# Patient Record
Sex: Male | Born: 1935 | Race: White | Hispanic: No | Marital: Single | Smoking: Former smoker
Health system: Southern US, Community
[De-identification: ages and names within clinical notes are randomized; demographics above are authoritative.]

## PROBLEM LIST (undated history)

## (undated) DIAGNOSIS — Z923 Personal history of irradiation: Secondary | ICD-10-CM

## (undated) DIAGNOSIS — IMO0001 Reserved for inherently not codable concepts without codable children: Secondary | ICD-10-CM

## (undated) DIAGNOSIS — Z87442 Personal history of urinary calculi: Secondary | ICD-10-CM

## (undated) DIAGNOSIS — I10 Essential (primary) hypertension: Secondary | ICD-10-CM

## (undated) DIAGNOSIS — Z9861 Coronary angioplasty status: Secondary | ICD-10-CM

## (undated) DIAGNOSIS — I2699 Other pulmonary embolism without acute cor pulmonale: Secondary | ICD-10-CM

## (undated) DIAGNOSIS — R198 Other specified symptoms and signs involving the digestive system and abdomen: Secondary | ICD-10-CM

## (undated) DIAGNOSIS — E785 Hyperlipidemia, unspecified: Secondary | ICD-10-CM

## (undated) DIAGNOSIS — K219 Gastro-esophageal reflux disease without esophagitis: Secondary | ICD-10-CM

## (undated) DIAGNOSIS — C61 Malignant neoplasm of prostate: Secondary | ICD-10-CM

## (undated) DIAGNOSIS — M1990 Unspecified osteoarthritis, unspecified site: Secondary | ICD-10-CM

## (undated) DIAGNOSIS — I2510 Atherosclerotic heart disease of native coronary artery without angina pectoris: Secondary | ICD-10-CM

## (undated) DIAGNOSIS — Z951 Presence of aortocoronary bypass graft: Secondary | ICD-10-CM

## (undated) DIAGNOSIS — M109 Gout, unspecified: Secondary | ICD-10-CM

## (undated) HISTORY — DX: Presence of aortocoronary bypass graft: Z95.1

## (undated) HISTORY — DX: Other pulmonary embolism without acute cor pulmonale: I26.99

## (undated) HISTORY — DX: Hyperlipidemia, unspecified: E78.5

## (undated) HISTORY — DX: Essential (primary) hypertension: I10

## (undated) HISTORY — DX: Coronary angioplasty status: Z98.61

## (undated) HISTORY — DX: Personal history of irradiation: Z92.3

## (undated) HISTORY — DX: Atherosclerotic heart disease of native coronary artery without angina pectoris: I25.10

## (undated) HISTORY — DX: Other specified symptoms and signs involving the digestive system and abdomen: R19.8

## (undated) HISTORY — DX: Personal history of urinary calculi: Z87.442

## (undated) HISTORY — DX: Gout, unspecified: M10.9

---

## 1993-04-20 HISTORY — PX: BACK SURGERY: SHX140

## 1998-09-18 ENCOUNTER — Ambulatory Visit (HOSPITAL_COMMUNITY): Admission: RE | Admit: 1998-09-18 | Discharge: 1998-09-18 | Payer: Self-pay | Admitting: Endocrinology

## 1998-10-03 ENCOUNTER — Ambulatory Visit (HOSPITAL_COMMUNITY): Admission: RE | Admit: 1998-10-03 | Discharge: 1998-10-03 | Payer: Self-pay | Admitting: Cardiology

## 1998-10-03 ENCOUNTER — Encounter: Payer: Self-pay | Admitting: Cardiology

## 1998-10-17 ENCOUNTER — Observation Stay (HOSPITAL_COMMUNITY): Admission: AD | Admit: 1998-10-17 | Discharge: 1998-10-18 | Payer: Self-pay | Admitting: Cardiology

## 1998-10-17 DIAGNOSIS — E785 Hyperlipidemia, unspecified: Secondary | ICD-10-CM | POA: Insufficient documentation

## 1998-10-17 HISTORY — PX: CORONARY ANGIOPLASTY WITH STENT PLACEMENT: SHX49

## 1998-11-21 ENCOUNTER — Encounter (HOSPITAL_COMMUNITY): Admission: RE | Admit: 1998-11-21 | Discharge: 1999-02-19 | Payer: Self-pay | Admitting: Cardiology

## 1998-11-25 ENCOUNTER — Ambulatory Visit (HOSPITAL_COMMUNITY): Admission: RE | Admit: 1998-11-25 | Discharge: 1998-11-25 | Payer: Self-pay | Admitting: Cardiology

## 1998-11-25 ENCOUNTER — Encounter: Payer: Self-pay | Admitting: Cardiology

## 1999-07-17 ENCOUNTER — Encounter: Payer: Self-pay | Admitting: Cardiology

## 1999-07-17 ENCOUNTER — Encounter: Admission: RE | Admit: 1999-07-17 | Discharge: 1999-07-17 | Payer: Self-pay | Admitting: Cardiology

## 1999-07-21 ENCOUNTER — Ambulatory Visit (HOSPITAL_COMMUNITY): Admission: RE | Admit: 1999-07-21 | Discharge: 1999-07-22 | Payer: Self-pay | Admitting: Cardiology

## 1999-07-21 HISTORY — PX: CORONARY ANGIOPLASTY: SHX604

## 2004-07-12 ENCOUNTER — Emergency Department (HOSPITAL_COMMUNITY): Admission: EM | Admit: 2004-07-12 | Discharge: 2004-07-12 | Payer: Self-pay | Admitting: Emergency Medicine

## 2004-11-03 ENCOUNTER — Emergency Department (HOSPITAL_COMMUNITY): Admission: EM | Admit: 2004-11-03 | Discharge: 2004-11-03 | Payer: Self-pay | Admitting: Emergency Medicine

## 2004-11-17 ENCOUNTER — Encounter: Admission: RE | Admit: 2004-11-17 | Discharge: 2004-11-17 | Payer: Self-pay | Admitting: Endocrinology

## 2010-01-22 ENCOUNTER — Encounter (INDEPENDENT_AMBULATORY_CARE_PROVIDER_SITE_OTHER): Payer: Self-pay | Admitting: General Surgery

## 2010-01-22 ENCOUNTER — Observation Stay (HOSPITAL_COMMUNITY): Admission: RE | Admit: 2010-01-22 | Discharge: 2010-01-23 | Payer: Self-pay | Admitting: General Surgery

## 2010-01-22 HISTORY — PX: CHOLECYSTECTOMY: SHX55

## 2010-02-17 ENCOUNTER — Encounter: Admission: RE | Admit: 2010-02-17 | Discharge: 2010-02-17 | Payer: Self-pay | Admitting: Orthopedic Surgery

## 2010-07-02 LAB — COMPREHENSIVE METABOLIC PANEL
AST: 36 U/L (ref 0–37)
CO2: 29 mEq/L (ref 19–32)
Chloride: 100 mEq/L (ref 96–112)
Creatinine, Ser: 1.19 mg/dL (ref 0.4–1.5)
GFR calc Af Amer: >60 mL/min (ref >60)
GFR calc non Af Amer: >60 mL/min (ref >60)
Glucose, Bld: 94 mg/dL (ref 70–99)
Total Bilirubin: 1.1 mg/dL (ref 0.3–1.2)

## 2010-07-02 LAB — CBC
MCH: 31.4 pg (ref 26.0–34.0)
WBC: 6.6 10*3/uL (ref 4.0–10.5)

## 2010-07-02 LAB — DIFFERENTIAL
Basophils Absolute: 0 10*3/uL (ref 0.0–0.1)
Eosinophils Absolute: 0.2 10*3/uL (ref 0.0–0.7)
Eosinophils Relative: 3 % (ref 0–5)
Lymphocytes Relative: 19 % (ref 12–46)
Neutrophils Relative %: 67 % (ref 43–77)

## 2010-07-02 LAB — PROTIME-INR: Prothrombin Time: 12.5 seconds (ref 11.6–15.2)

## 2010-07-02 LAB — SURGICAL PCR SCREEN: Staphylococcus aureus: NEGATIVE

## 2010-09-05 NOTE — Cardiovascular Report (Signed)
[place]  Patient:    Robert Boyle, Robert Boyle                      MRN: [id number] Proc. Date: 07/21/99 Adm. Date:  [id number] Attending:  Loreli Dollar CC:         Alfonse Alpers. Dagoberto Ligas, M.D.             Cath. Lab.             Thereasa Solo. Little, M.D.                        Cardiac Catheterization  INDICATION FOR TESTS:  Mr. Schmutz is a 75 year old male who had a stent placed to his diagonal October 17, 1998.  He has developed some atypical chest pain and fatigue similar to what he had before his June intervention.  Because of this he was brought to the cath lab for reevaluation.  His cath is done as an outpatient.  PROCEDURE: 1. Left heart catheterization. 2. Selective right and left coronary arteriography. 3. Ventriculography in the RAO projection. 4. Angioplasty within the stent to the first diagonal.  COMPLICATIONS:  None.  PROCEDURE:  The patient was prepped and draped in the usual sterile fashion exposing the right groin.  Following a local anesthetic of 1% Xylocaine, the Seldinger technique was employed and a #6 [place] introducer sheath was placed into the right femoral artery.  Selective right and left coronary arteriography and ventriculography in the RAO projection was performed.  RESULTS: 1. Hemodynamic monitoring:  Central aortic pressure 157/84.  Left ventricular    pressure 155/18, with no significant valve gradient noted at the time of    pullback. 2. Ventriculography:  Ventriculography in the RAO projection revealed normal left    ventricular systolic function.  Ejection fraction greater than 60%.    End-diastolic pressure was 20.  Mitral valve prolapse without mitral    regurgitation was noted.  CORONARY ANGIOGRAPHY:  The stent was noted on fluoroscopy but no significant calcification was seen.  1. Left main normal. 2. Left anterior descending:  The LAD extended down and crossed the apex of the  heart and the sternal  portion of the LAD was relatively small in diameter.  In  the midportion of the LAD, well past the takeoff of the first diagonal was an    area of 40-50% narrowing.  The first diagonal which had a stent in its    midportion had an area of 70-75% restenosis with the midportion of the stent.    There was a side branch that came off within the stent and it did not appear    to be affected.  There was brisk distal flow - TIMI-3 despite the above    mentioned stenosis. 3. Circumflex:  The circumflex was a small vessel and gave rise to one terminal    OM branch.  This system was free of disease. 4. Optional diagonal:  Small free of disease. 5. Right coronary artery:  The right coronary artery was the largest of all three    vessels.  It was about 3.5-4 mm in diameter.  There was multiple sequential    30 and 40% areas of narrowing in the midportion.  The PDA was normal and the    posterolateral branches were normal.  CONCLUSION: 1. Normal left ventricular systolic function. 2. Mitral valve prolapse without mitral regurgitation. 3. Within the stent restenosis in the  diagonal with mild disease involving the    the left anterior descending and right coronary artery.  Because of the stenosis within the stent, arrangements were made for repeat intervention.  A #6 [place] guide catheter JL-3.5, a short luge wire and a 2.5 x 15 CrossSail balloon was made ready.  The wire was placed down the diagonal without difficulty.  The balloon was positioned within the body of the stent and the first inflation 6 for 40 seconds was performed.  The next two inflations were associated with the balloon wanting to migrate either proximally or distally. he last two inflations 5 x 54, and 7 x 62, were associated with excellent positioning and excellent result.  After the above mentioned inflations, the vessel appeared to be normal with no evidence of residual narrowing and no effect at the side branch. There  was brisk distal flow.  During the procedure, the patient was given 12,500 units of IV heparin.  He will be discharged in the morning. DD:  07/21/99 TD:  07/21/99 Job: [id number]

## 2010-10-07 ENCOUNTER — Other Ambulatory Visit: Payer: Self-pay | Admitting: Emergency Medicine

## 2010-10-07 ENCOUNTER — Ambulatory Visit
Admission: RE | Admit: 2010-10-07 | Discharge: 2010-10-07 | Disposition: A | Discharge status: Home / Self Care | Payer: Medicare Other | Source: Ambulatory Visit | Attending: Emergency Medicine | Admitting: Emergency Medicine

## 2010-10-07 DIAGNOSIS — R110 Nausea: Secondary | ICD-10-CM

## 2010-10-07 MED ORDER — IOHEXOL 300 MG/ML  SOLN
100.0000 mL | Freq: Once | INTRAMUSCULAR | Status: AC | PRN
  Administered 2010-10-07: 100 mL via INTRAVENOUS

## 2011-11-06 HISTORY — PX: NM MYOCAR PERF WALL MOTION: HXRAD629

## 2012-08-30 ENCOUNTER — Other Ambulatory Visit: Payer: Self-pay | Admitting: Dermatology

## 2012-12-08 ENCOUNTER — Encounter: Payer: Self-pay | Admitting: *Deleted

## 2012-12-12 ENCOUNTER — Encounter: Payer: Self-pay | Admitting: Cardiovascular Disease

## 2012-12-13 ENCOUNTER — Encounter: Payer: Self-pay | Admitting: Cardiovascular Disease

## 2012-12-13 ENCOUNTER — Ambulatory Visit: Payer: Medicare Other | Admitting: Cardiology

## 2012-12-13 ENCOUNTER — Ambulatory Visit (INDEPENDENT_AMBULATORY_CARE_PROVIDER_SITE_OTHER): Payer: Medicare Other | Admitting: Cardiovascular Disease

## 2012-12-13 VITALS — BP 142/74 | HR 82 | Resp 16 | Ht 73.0 in | Wt 199.1 lb

## 2012-12-13 DIAGNOSIS — I2510 Atherosclerotic heart disease of native coronary artery without angina pectoris: Secondary | ICD-10-CM

## 2012-12-13 DIAGNOSIS — E785 Hyperlipidemia, unspecified: Secondary | ICD-10-CM

## 2012-12-13 DIAGNOSIS — M109 Gout, unspecified: Secondary | ICD-10-CM

## 2012-12-13 DIAGNOSIS — I10 Essential (primary) hypertension: Secondary | ICD-10-CM

## 2012-12-13 NOTE — Patient Instructions (Addendum)
Your physician recommends that you schedule a follow-up appointment in: One year.  

## 2012-12-19 ENCOUNTER — Encounter: Payer: Self-pay | Admitting: Cardiovascular Disease

## 2012-12-19 DIAGNOSIS — M109 Gout, unspecified: Secondary | ICD-10-CM | POA: Insufficient documentation

## 2012-12-19 NOTE — Assessment & Plan Note (Signed)
Borderline elevated systolic blood pressure, but he says that home blood pressures lower. No changes made

## 2012-12-19 NOTE — Assessment & Plan Note (Signed)
Asymptomatic. Recent normal nuclear perfusion study. No changes in medications

## 2012-12-19 NOTE — Assessment & Plan Note (Signed)
We'll try to retrieve the more recent results.

## 2012-12-19 NOTE — Progress Notes (Signed)
Patient ID: Robert Boyle, male   DOB: [birth year], 77 y.o.   MRN: [id number]     Reason for office visit CAD followup  This is my first encounter with Robert Boyle who was previously cared for by Dr. Lamar Blinks until his retirement last year. Robert Boyle presented with coronary disease in 2000 when he received a bare-metal stent to the diagonal artery, patent we peak angioplasty for in-stent restenosis one year later but has not had any further cardiac event since that time. In the year that has passed since his last appointment he has not had any complaints of chest pain, shortness of breath, syncope, palpitations, lower extremity edema or other cardiovascular problems    No Known Allergies  Current Outpatient Prescriptions  Medication Sig Dispense Refill  . allopurinol (ZYLOPRIM) 300 MG tablet Take 300 mg by mouth daily.      Marland Kitchen amLODipine (NORVASC) 5 MG tablet Take 5 mg by mouth daily.      Marland Kitchen aspirin 325 MG EC tablet Take 325 mg by mouth daily.      Marland Kitchen atorvastatin (LIPITOR) 80 MG tablet Take 80 mg by mouth daily.      . celecoxib (CELEBREX) 200 MG capsule Take 200 mg by mouth daily.      Marland Kitchen esomeprazole (NEXIUM) 40 MG capsule Take 40 mg by mouth daily before breakfast.      . furosemide (LASIX) 20 MG tablet Take 20 mg by mouth daily.       No current facility-administered medications for this visit.    Past Medical History  Diagnosis Date  . Hypertension   . Hyperlipidemia   . Gout   . CAD (coronary artery disease)   . Gastrointestinal complaints     Past Surgical History  Procedure Laterality Date  . Coronary angioplasty with stent placement  10/17/1998    PCI & STENT to LAD  . Coronary angioplasty  07/21/1999    PCI to instent stenosis to LAD  . Back surgery  1995  . Nm myocar perf wall motion  11/06/2011    low risk 63% EF    Family History  Problem Relation Age of Onset  . Heart attack Father   . Stroke Mother   . Heart attack Brother     History   Social  History  . Marital Status: Single    Spouse Name: N/A    Number of Children: N/A  . Years of Education: N/A   Occupational History  . Not on file.   Social History Main Topics  . Smoking status: Former Smoker    Quit date: 04/20/1983  . Smokeless tobacco: Not on file  . Alcohol Use: Yes  . Drug Use: No  . Sexual Activity: Not on file   Other Topics Concern  . Not on file   Social History Narrative  . No narrative on file    Review of systems: The patient specifically denies any chest pain at rest or with exertion, dyspnea at rest or with exertion, orthopnea, paroxysmal nocturnal dyspnea, syncope, palpitations, focal neurological deficits, intermittent claudication, lower extremity edema, unexplained weight gain, cough, hemoptysis or wheezing.  The patient also denies abdominal pain, nausea, vomiting, dysphagia, diarrhea, constipation, polyuria, polydipsia, dysuria, hematuria, frequency, urgency, abnormal bleeding or bruising, fever, chills, unexpected weight changes, mood swings, change in skin or hair texture, change in voice quality, auditory or visual problems, allergic reactions or rashes, new musculoskeletal complaints other than usual "aches and pains".   PHYSICAL EXAM BP  142/74  Pulse 82  Resp 16  Ht 6\' 1"  (1.854 m)  Wt 199 lb 1.6 oz (90.311 kg)  BMI 26.27 kg/m2  General: Alert, oriented x3, no distress Head: no evidence of trauma, PERRL, EOMI, no exophtalmos or lid lag, no myxedema, no xanthelasma; normal ears, nose and oropharynx Neck: normal jugular venous pulsations and no hepatojugular reflux; brisk carotid pulses without delay and no carotid bruits Chest: clear to auscultation, no signs of consolidation by percussion or palpation, normal fremitus, symmetrical and full respiratory excursions Cardiovascular: normal position and quality of the apical impulse, regular rhythm, normal first and second heart sounds, no murmurs, rubs or gallops Abdomen: no tenderness  or distention, no masses by palpation, no abnormal pulsatility or arterial bruits, normal bowel sounds, no hepatosplenomegaly Extremities: no clubbing, cyanosis or edema; 2+ radial, ulnar and brachial pulses bilaterally; 2+ right femoral, posterior tibial and dorsalis pedis pulses; 2+ left femoral, posterior tibial and dorsalis pedis pulses; no subclavian or femoral bruits Neurological: grossly nonfocal   EKG: Sinus rhythm with a couple of premature atrial complexes, otherwise normal tracing  Lipid Panel  Was recent lipid profile that I have available for review is a couple of years old and shows a total cholesterol of 183, triglycerides 116, HDL 78, LDL 82. He reportedly had more recent laboratory tests with his primary care physician but these are not currently available  BMET    Component Value Date/Time   NA 136 01/22/2010 1351   K 3.8 01/22/2010 1351   CL 100 01/22/2010 1351   CO2 29 01/22/2010 1351   GLUCOSE 94 01/22/2010 1351   BUN 15 01/22/2010 1351   CREATININE 1.19 01/22/2010 1351   CALCIUM 9.2 01/22/2010 1351   GFRNONAA >60 01/22/2010 1351   GFRAA  Value: >60        The eGFR has been calculated using the MDRD equation. This calculation has not been validated in all clinical situations. eGFR's persistently <60 mL/min signify possible Chronic Kidney Disease. 01/22/2010 1351     ASSESSMENT AND PLAN CAD STATUS post bare-metal stent to diagonal 2000 (AVE660 2.5x18), treated for in stent restenosis 2001 Asymptomatic. Recent normal nuclear perfusion study. No changes in medications  Hyperlipidemia We'll try to retrieve the more recent results.  HTN (hypertension) Borderline elevated systolic blood pressure, but he says that home blood pressures lower. No changes made  Gout No recent arthritis attacks   Orders Placed This Encounter  Procedures  . EKG 12-Lead   Followup in one year Grete Bosko  Thurmon Fair, MD, [facility] [phone]  office [phone] pager

## 2012-12-19 NOTE — Assessment & Plan Note (Signed)
No recent arthritis attacks

## 2013-05-23 DIAGNOSIS — C61 Malignant neoplasm of prostate: Secondary | ICD-10-CM

## 2013-05-23 HISTORY — DX: Malignant neoplasm of prostate: C61

## 2013-05-23 HISTORY — PX: PROSTATE BIOPSY: SHX241

## 2013-06-13 ENCOUNTER — Encounter: Payer: Self-pay | Admitting: Radiation Oncology

## 2013-06-13 NOTE — Progress Notes (Signed)
GU Location of Tumor / Histology: prostate  If Prostate Cancer, Gleason Score is (4 + 3) and PSA is (5.60 on 04/25/13)  Patient presented 1 months ago with signs/symptoms of: elevated PSA, nocturia 02/15/13 PSA 4.6 08/15/12  PSA 4.3  Biopsies of prostate (if applicable) revealed: adenocarcinoma, 7/12 cores, Gleason 4+3=7, volume 34.32 cc  Past/Anticipated interventions by urology, if any: biopsy  Past/Anticipated interventions by medical oncology, if any: none  Weight changes, if any: no  Bowel/Bladder complaints, if any:  Urinary frequency, nocturia x 1, IPSS 3  Nausea/Vomiting, if any: no  Pain issues, if any:  no  SAFETY ISSUES:  Prior radiation? no  Pacemaker/ICD? no  Possible current pregnancy? na  Is the patient on methotrexate? no  Current Complaints / other details:  Single, retired from [employer], 2 sons, 1 daughter Discuss seed therapy w/prostate "sandwich" hormone therapy, IMRT.

## 2013-06-14 ENCOUNTER — Ambulatory Visit
Admission: RE | Admit: 2013-06-14 | Discharge: 2013-06-14 | Disposition: A | Discharge status: Home / Self Care | Payer: Medicare Other | Source: Ambulatory Visit | Attending: Radiation Oncology | Admitting: Radiation Oncology

## 2013-06-14 ENCOUNTER — Encounter: Payer: Self-pay | Admitting: Radiation Oncology

## 2013-06-14 VITALS — BP 140/89 | HR 88 | Temp 98.1°F | Resp 20 | Ht 73.0 in | Wt 199.0 lb

## 2013-06-14 DIAGNOSIS — E785 Hyperlipidemia, unspecified: Secondary | ICD-10-CM | POA: Insufficient documentation

## 2013-06-14 DIAGNOSIS — C61 Malignant neoplasm of prostate: Secondary | ICD-10-CM

## 2013-06-14 DIAGNOSIS — Z9861 Coronary angioplasty status: Secondary | ICD-10-CM | POA: Insufficient documentation

## 2013-06-14 DIAGNOSIS — Z87442 Personal history of urinary calculi: Secondary | ICD-10-CM | POA: Insufficient documentation

## 2013-06-14 DIAGNOSIS — I10 Essential (primary) hypertension: Secondary | ICD-10-CM | POA: Insufficient documentation

## 2013-06-14 DIAGNOSIS — I2510 Atherosclerotic heart disease of native coronary artery without angina pectoris: Secondary | ICD-10-CM | POA: Insufficient documentation

## 2013-06-14 DIAGNOSIS — E7800 Pure hypercholesterolemia, unspecified: Secondary | ICD-10-CM | POA: Insufficient documentation

## 2013-06-14 DIAGNOSIS — Z87891 Personal history of nicotine dependence: Secondary | ICD-10-CM | POA: Insufficient documentation

## 2013-06-14 DIAGNOSIS — Z823 Family history of stroke: Secondary | ICD-10-CM | POA: Insufficient documentation

## 2013-06-14 DIAGNOSIS — M109 Gout, unspecified: Secondary | ICD-10-CM | POA: Insufficient documentation

## 2013-06-14 DIAGNOSIS — Z79899 Other long term (current) drug therapy: Secondary | ICD-10-CM | POA: Insufficient documentation

## 2013-06-14 DIAGNOSIS — K219 Gastro-esophageal reflux disease without esophagitis: Secondary | ICD-10-CM | POA: Insufficient documentation

## 2013-06-14 DIAGNOSIS — Z8249 Family history of ischemic heart disease and other diseases of the circulatory system: Secondary | ICD-10-CM | POA: Insufficient documentation

## 2013-06-14 HISTORY — DX: Unspecified osteoarthritis, unspecified site: M19.90

## 2013-06-14 HISTORY — DX: Malignant neoplasm of prostate: C61

## 2013-06-14 HISTORY — DX: Gastro-esophageal reflux disease without esophagitis: K21.9

## 2013-06-14 NOTE — Progress Notes (Signed)
Please see the Nurse Progress Note in the MD Initial Consult Encounter for this patient. 

## 2013-06-14 NOTE — Progress Notes (Signed)
[facility] NEW PATIENT EVALUATION  Name: Robert Boyle MRN: [id number]  Date:   06/14/2013           DOB: [birth year]  Status: outpatient   CC: Chesley Noon, MD  Ailene Rud, *    REFERRING PHYSICIAN: Carolan Clines I, *   DIAGNOSIS:  Stage TI C. intermediate risk adenocarcinoma prostate.  HISTORY OF PRESENT ILLNESS:  Robert Boyle is a 78 y.o. male who is seen today through the courtesy of Dr. Gaynelle Arabian for discussion of possible radiation therapy in the management of his stage TI C. intermediate risk adenocarcinoma prostate. He was noted to have a rise in his PSA from 4.6 in October 2014 to 5.6 on 04/25/2013. He underwent ultrasound-guided biopsies on 05/23/2013 revealing Gleason 7 (4+3) involving 80% of one core from the left lateral apex and Gleason 7 (3+4) involving 50% of one core from right lateral base, 60% of one core from the right base, 30% of one core from left lateral base, and 20% of one core from the left base. He also had Gleason 6 (3+3) involving 30% of one core from the left lateral mid gland, and 10% of one core from the left mid gland. His gland volume was 34.3 cc. He is doing well from a GU and GI standpoint. His I PSS score is 3. He does have some degree of erectile dysfunction which improves with Viagra.  PREVIOUS RADIATION THERAPY: No   PAST MEDICAL HISTORY:  has a past medical history of Hypertension; Hyperlipidemia; Gout; CAD (coronary artery disease); Gastrointestinal complaints; Prostate cancer (05/23/13); Arthritis; GERD (gastroesophageal reflux disease); Gout; Hypercholesterolemia; and Renal calculi.     PAST SURGICAL HISTORY:  Past Surgical History  Procedure Laterality Date  . Coronary angioplasty with stent placement  10/17/1998    PCI & STENT to LAD  . Coronary angioplasty  07/21/1999    PCI to instent stenosis to LAD  . Nm myocar perf wall motion  11/06/2011    low risk 63% EF  . Prostate biopsy   05/23/13    gleason 4+3=7, vol 34.32 cc  . Cholecystectomy  01/22/10  . Back surgery  1995    lumbar     FAMILY HISTORY: family history includes Cancer in his brother; Heart attack in his brother and father; Stroke in his mother. his father died of cardiac disease and 49. His mother died following a stroke at 66. A younger brother was diagnosed with prostate cancer and had seed implantation at age 81.   SOCIAL HISTORY:  reports that he quit smoking about 30 years ago. He does not have any smokeless tobacco history on file. He reports that he drinks alcohol. He reports that he does not use illicit drugs. Married, 3 children. 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   ALLERGIES: Review of patient's allergies indicates no known allergies.   MEDICATIONS:  Current Outpatient Prescriptions  Medication Sig Dispense Refill  . allopurinol (ZYLOPRIM) 300 MG tablet Take 300 mg by mouth daily.      Marland Kitchen amLODipine (NORVASC) 5 MG tablet Take 5 mg by mouth daily.      Marland Kitchen aspirin 325 MG EC tablet Take 325 mg by mouth daily.      Marland Kitchen atorvastatin (LIPITOR) 80 MG tablet Take 80 mg by mouth daily.      . celecoxib (CELEBREX) 200 MG capsule Take 200 mg by mouth daily.      Marland Kitchen esomeprazole (NEXIUM) 40 MG capsule  Take 40 mg by mouth daily before breakfast.      . furosemide (LASIX) 20 MG tablet Take 20 mg by mouth daily.       No current facility-administered medications for this encounter.     REVIEW OF SYSTEMS:  Pertinent items are noted in HPI.    PHYSICAL EXAM:  height is 6\' 1"  (1.854 m) and weight is 199 lb (90.266 kg). His oral temperature is 98.1 F (36.7 C). His blood pressure is 140/89 and his pulse is 88. His respiration is 20.   Alert and oriented 78 year old white male appearing his stated age. Head and neck examination: Grossly unremarkable. Nodes: Without palpable cervical or supraclavicular lymphadenopathy. Chest: Lungs clear. Back: Without spinal or CVA tenderness. Heart: Regular in  rhythm. Abdomen: Without masses organomegaly. Genitalia: Unremarkable to inspection. Rectal: The prostate gland is normal in size and is without focal induration or nodularity. Extremities: Without edema.   LABORATORY DATA:  Lab Results  Component Value Date   WBC 6.6 01/22/2010   HGB 14.6 01/22/2010   HCT 42.5 01/22/2010   MCV 91.4 01/22/2010   PLT 139* 01/22/2010   Lab Results  Component Value Date   NA 136 01/22/2010   K 3.8 01/22/2010   CL 100 01/22/2010   CO2 29 01/22/2010   Lab Results  Component Value Date   ALT 36 01/22/2010   AST 36 01/22/2010   ALKPHOS 95 01/22/2010   BILITOT 1.1 01/22/2010   PSA 5.6 from 04/25/2013   IMPRESSION: Stage TI C. intermediate risk adenocarcinoma prostate. I explained to the patient and his wife that his prognosis is related to his stage, PSA level, and Gleason score. His stage and PSA are favorable while his Gleason score of 7 is of intermediate favorability. He does have moderate volume disease. We discussed radiation therapy options including 5 weeks of external beam followed by seed implantation or 8 weeks of external beam/IMRT. We also discussed short-term androgen deprivation therapy which is currently being looked at in a national trial. This would be a consideration considering the number of positive Gleason 7 biopsies. We discussed the potential acute and late toxicities of radiation therapy. He is not interested in seed implantation. He'll think things over and decide on whether not he would like to consider short-term androgen deprivation therapy. He knows that we need to have Dr. Gaynelle Arabian placed 3 gold seed markers within the prostate for image guidance. We also talked about treating him with a comfortably full bladder to minimize urinary related toxicity.   PLAN: As discussed above. The patient will contact me if and when he would like to proceed with radiation therapy.  I spent 60 minutes minutes face to face with the patient and more than 50%  of that time was spent in counseling and/or coordination of care.

## 2013-06-15 ENCOUNTER — Encounter: Payer: Self-pay | Admitting: Radiation Oncology

## 2013-06-15 NOTE — Progress Notes (Signed)
CC: Dr. Katrine Coho   Chart note: The patient called me today to some and that he wants to proceed with external beam radiation therapy in addition to short-term androgen deprivation therapy for 6-7 months beginning 2-3 months before initiation of radiation therapy. I will contact Dr. Arlyn Leak nurse and get his androgen deprivation therapy started. We will kindly request Dr. Gaynelle Arabian to place 3 gold seed markers for image guidance sometime within the next 6-8 weeks. I will see him for a followup visit in 2 months and they get him scheduled for simulation/treatment planning. Consent was previously signed.

## 2013-06-16 ENCOUNTER — Telehealth: Payer: Self-pay | Admitting: *Deleted

## 2013-06-16 NOTE — Telephone Encounter (Signed)
XXXX 

## 2013-06-16 NOTE — Telephone Encounter (Signed)
CALLED PATIENT TO INFORM OF GOLD SEED PLACEMENT ON 07-12-13 @ 1:45 PM @ UROLOGIST'S OFFICE AND HIS [place] VISIT ON 08-15-13, LVM FOR A RETURN CALL

## 2013-08-14 NOTE — Progress Notes (Addendum)
GU Location of Tumor / Histology: prostate   If Prostate Cancer, Gleason Score is (4 + 3) and PSA is (5.60 on 04/25/13)   Patient presented 1 months ago with signs/symptoms of: elevated PSA, nocturia  02/15/13 PSA 4.6  08/15/12 PSA 4.3   Biopsies of prostate (if applicable) revealed: adenocarcinoma, 7/12 cores, Gleason 4+3=7, volume 34.32 cc   Past/Anticipated interventions by urology, if any: biopsy ,  07/12/13  3 gold seed markers placed by Dr Gaynelle Arabian Past/Anticipated interventions by medical oncology, if any: none  Weight changes, if any: no  Bowel/Bladder complaints, if any: Urinary frequency, nocturia x 2-3 which is slight increase in past 2 months. Nausea/Vomiting, if any: no  Pain issues, if any: no   SAFETY ISSUES:  Prior radiation? no  Pacemaker/ICD? no  Possible current pregnancy? na  Is the patient on methotrexate? No  Current Complaints / other details: Single, retired from [employer], 2 sons, 1 daughter  Pt placed on Paxil per Dr Gaynelle Arabian for hot flashes, no improvement at this point.  Received hormone injection approximately 2 weeks ago per pt.

## 2013-08-15 ENCOUNTER — Ambulatory Visit
Admission: RE | Admit: 2013-08-15 | Discharge: 2013-08-15 | Disposition: A | Discharge status: Home / Self Care | Payer: Medicare HMO | Source: Ambulatory Visit | Attending: Radiation Oncology | Admitting: Radiation Oncology

## 2013-08-15 ENCOUNTER — Encounter: Payer: Self-pay | Admitting: Radiation Oncology

## 2013-08-15 VITALS — BP 173/88 | HR 83 | Temp 97.9°F | Resp 20 | Wt 201.1 lb

## 2013-08-15 DIAGNOSIS — R5381 Other malaise: Secondary | ICD-10-CM | POA: Insufficient documentation

## 2013-08-15 DIAGNOSIS — R5383 Other fatigue: Secondary | ICD-10-CM

## 2013-08-15 DIAGNOSIS — C61 Malignant neoplasm of prostate: Secondary | ICD-10-CM | POA: Insufficient documentation

## 2013-08-15 NOTE — Addendum Note (Signed)
Encounter addended by: Andria Rhein, RN on: 08/15/2013  9:09 AM<BR>     Documentation filed: Charges VN

## 2013-08-15 NOTE — Progress Notes (Signed)
CC: Dr. Katrine Coho, Dr. Anastasia Pall  Diagnosis: Stage TI C. intermediate risk adenocarcinoma prostate.  History: Robert Boyle is a pleasant 78 year old male who is seen today for review and scheduling of his radiation therapy in the management of his stage TI C. intermediate risk adenocarcinoma prostate.He was noted to have a rise in his PSA from 4.6 in October 2014 to 5.6 on 04/25/2013. He underwent ultrasound-guided biopsies on 05/23/2013 revealing Gleason 7 (4+3) involving 80% of one core from the left lateral apex and Gleason 7 (3+4) involving 50% of one core from right lateral base, 60% of one core from the right base, 30% of one core from left lateral base, and 20% of one core from the left base. He also had Gleason 6 (3+3) involving 30% of one core from the left lateral mid gland, and 10% of one core from the left mid gland. His gland volume was 34.3 cc.  His I PSS score was 3. He does have some degree of erectile dysfunction which improves with Viagra. He elected for short-term androgen deprivation therapy along with external beam/IMRT. He received a Firmagon injection on March 2 and then a six-month Depo-Lupron injection on 07/20/2013. Gold markers were placed on March 25. He states that he is doing well from a GU and GI standpoint although he does have nocturia x2-3 which began with initiation of androgen deprivation therapy. He does report occasional hot flashes. He was started on Paxil for hot flashes. He also admits to mild fatigue.  Physical examination: Alert and oriented. Filed Vitals:   08/15/13 0815  BP: 173/88  Pulse: 83  Temp: 97.9 F (36.6 C)  Resp: 20   Rectal examination not performed.   Impression:  Stage TI C. intermediate risk adenocarcinoma prostate. Again, we discussed the potential acute and late toxicities of radiation therapy. We discussed having a comfortably full bladder for treatment planning and also each daily treatment to minimize treatment-related  urinary toxicity. I'll have him return in mid  May for simulation/treatment planning and then begin his treatment by early June. Consent is signed today.  25 minutes was spent face-to-face with the patient, primarily counseling the patient and coordinating his care.

## 2013-08-15 NOTE — Progress Notes (Signed)
Please see the Nurse Progress Note in the MD Initial Consult Encounter for this patient. 

## 2013-08-16 NOTE — Addendum Note (Signed)
Encounter addended by: Deirdre Evener, RN on: 08/16/2013  7:29 PM<BR>     Documentation filed: Charges VN

## 2013-08-28 ENCOUNTER — Ambulatory Visit
Admission: RE | Admit: 2013-08-28 | Discharge: 2013-08-28 | Disposition: A | Discharge status: Home / Self Care | Payer: Medicare HMO | Source: Ambulatory Visit | Attending: Radiation Oncology | Admitting: Radiation Oncology

## 2013-08-28 DIAGNOSIS — C61 Malignant neoplasm of prostate: Secondary | ICD-10-CM | POA: Diagnosis not present

## 2013-08-28 DIAGNOSIS — Z510 Encounter for antineoplastic radiation therapy: Secondary | ICD-10-CM | POA: Diagnosis not present

## 2013-08-28 NOTE — Progress Notes (Signed)
Complex simulation/treatment planning note: The patient was taken to the CT simulator. He was placed supine. A VAC LOC immobilization device was constructed. He had placement of a red rubber catheter within the rectal vault. He was then catheterized for a urethrogram. He was then scanned. I chose and isocenter in the center of the prostate. The CT data set was sent to the MIM tanning system I contoured his prostate, seminal vesicles, bladder, rectum, and lower rectosigmoid colon. Prescribing 7800 cGy in 40 sessions to his prostate PTV which represents the prostate plus 0.8 cm except for 0.5 cm along the rectum. I am prescribing 5600 cGy in 40 sessions to his seminal vesicles PTV which represents the seminal vesicles plus 0.5 cm. He will be treated with a comfortably full bladder. I requesting daily cone beam CT, setting up to his 3 gold seeds.

## 2013-08-29 ENCOUNTER — Encounter: Payer: Self-pay | Admitting: Radiation Oncology

## 2013-08-29 DIAGNOSIS — Z510 Encounter for antineoplastic radiation therapy: Secondary | ICD-10-CM | POA: Diagnosis not present

## 2013-08-29 NOTE — Progress Notes (Signed)
IMRT simulation/treatment planning note: The patient completed his IMRT simulation/treatment planning in the management of his carcinoma the prostate. IMRT was chosen to decrease the risk for both acute and late bladder and rectal toxicity compared to conventional or 3-D conformal radiation therapy. Dose volume histograms were obtained for the target structures including the seminal vesicle and prostate PTV's in addition to avoidance structures including the rectum, bladder, and femoral heads. We met our departmental guidelines. I'm prescribing 7800 cGy in 40 sessions to his prostate PTV and 5600 cGy to his seminal vesicle PTV. He is to be treated with a comfortably full bladder. I am requesting daily cone beam CT, setting up to his 3 gold seeds.

## 2013-08-30 DIAGNOSIS — Z510 Encounter for antineoplastic radiation therapy: Secondary | ICD-10-CM | POA: Diagnosis not present

## 2013-09-06 ENCOUNTER — Ambulatory Visit
Admission: RE | Admit: 2013-09-06 | Discharge: 2013-09-06 | Disposition: A | Discharge status: Home / Self Care | Payer: Medicare HMO | Source: Ambulatory Visit | Attending: Radiation Oncology | Admitting: Radiation Oncology

## 2013-09-06 DIAGNOSIS — Z510 Encounter for antineoplastic radiation therapy: Secondary | ICD-10-CM | POA: Diagnosis not present

## 2013-09-06 DIAGNOSIS — C61 Malignant neoplasm of prostate: Secondary | ICD-10-CM

## 2013-09-06 NOTE — Progress Notes (Signed)
Chart note:   The patient began his radiation therapy today. He is being treated with 2 volume modulated arcs with 2 sets of dynamic MLCs corresponding to one set of IMRT treatment devices ([id number]).

## 2013-09-07 ENCOUNTER — Ambulatory Visit: Payer: Medicare HMO

## 2013-09-07 ENCOUNTER — Ambulatory Visit
Admission: RE | Admit: 2013-09-07 | Discharge: 2013-09-07 | Disposition: A | Discharge status: Home / Self Care | Payer: Medicare HMO | Source: Ambulatory Visit | Attending: Radiation Oncology | Admitting: Radiation Oncology

## 2013-09-07 DIAGNOSIS — Z510 Encounter for antineoplastic radiation therapy: Secondary | ICD-10-CM | POA: Diagnosis not present

## 2013-09-08 ENCOUNTER — Ambulatory Visit
Admission: RE | Admit: 2013-09-08 | Discharge: 2013-09-08 | Disposition: A | Discharge status: Home / Self Care | Payer: Medicare HMO | Source: Ambulatory Visit | Attending: Radiation Oncology | Admitting: Radiation Oncology

## 2013-09-08 DIAGNOSIS — Z510 Encounter for antineoplastic radiation therapy: Secondary | ICD-10-CM | POA: Diagnosis not present

## 2013-09-08 DIAGNOSIS — C61 Malignant neoplasm of prostate: Secondary | ICD-10-CM

## 2013-09-08 NOTE — Progress Notes (Addendum)
Patient education done radiation therapy and you book given, discussed fatigue, skin irritation, pain, bladder changes, loose stools, increase protein in diet, stay hydrated ,drink plenty water, come with full bladder before rad txs, sees MD Mondays, but will see this Tuesday instead,Holiday this Monday, , all questions answered, noticed right pink eye, patient stated it has been bothering him a few days and will see his Eye Dr., teach back given 9:15 AM

## 2013-09-12 ENCOUNTER — Ambulatory Visit
Admission: RE | Admit: 2013-09-12 | Discharge: 2013-09-12 | Disposition: A | Discharge status: Home / Self Care | Payer: Medicare HMO | Source: Ambulatory Visit | Attending: Radiation Oncology | Admitting: Radiation Oncology

## 2013-09-12 VITALS — BP 165/86 | HR 80 | Resp 20 | Wt 201.6 lb

## 2013-09-12 DIAGNOSIS — C61 Malignant neoplasm of prostate: Secondary | ICD-10-CM

## 2013-09-12 DIAGNOSIS — Z510 Encounter for antineoplastic radiation therapy: Secondary | ICD-10-CM | POA: Diagnosis not present

## 2013-09-12 NOTE — Progress Notes (Signed)
Pt denies pain, urinary/bowel issues, fatigue, loss of appetite. 

## 2013-09-12 NOTE — Progress Notes (Signed)
Weekly Management Note:  Site: Prostate Current Dose:  780  cGy Projected Dose: 7800  cGy  Narrative: The patient is seen today for routine under treatment assessment. CBCT/MVCT images/port films were reviewed. The chart was reviewed.   Bladder filling is excellent. He does report fatigue which is unchanged. No GU or GI difficulties.  Physical Examination:  Filed Vitals:   09/12/13 0857  BP: 165/86  Pulse: 80  Resp: 20  .  Weight: 201 lb 9.6 oz (91.445 kg). No change.  Impression: Tolerating radiation therapy well.  Plan: Continue radiation therapy as planned.

## 2013-09-13 ENCOUNTER — Ambulatory Visit
Admission: RE | Admit: 2013-09-13 | Discharge: 2013-09-13 | Disposition: A | Discharge status: Home / Self Care | Payer: Medicare HMO | Source: Ambulatory Visit | Attending: Radiation Oncology | Admitting: Radiation Oncology

## 2013-09-13 DIAGNOSIS — Z510 Encounter for antineoplastic radiation therapy: Secondary | ICD-10-CM | POA: Diagnosis not present

## 2013-09-14 ENCOUNTER — Ambulatory Visit
Admission: RE | Admit: 2013-09-14 | Discharge: 2013-09-14 | Disposition: A | Discharge status: Home / Self Care | Payer: Medicare HMO | Source: Ambulatory Visit | Attending: Radiation Oncology | Admitting: Radiation Oncology

## 2013-09-14 DIAGNOSIS — Z510 Encounter for antineoplastic radiation therapy: Secondary | ICD-10-CM | POA: Diagnosis not present

## 2013-09-15 ENCOUNTER — Ambulatory Visit
Admission: RE | Admit: 2013-09-15 | Discharge: 2013-09-15 | Disposition: A | Discharge status: Home / Self Care | Payer: Medicare HMO | Source: Ambulatory Visit | Attending: Radiation Oncology | Admitting: Radiation Oncology

## 2013-09-15 DIAGNOSIS — Z510 Encounter for antineoplastic radiation therapy: Secondary | ICD-10-CM | POA: Diagnosis not present

## 2013-09-18 ENCOUNTER — Ambulatory Visit
Admission: RE | Admit: 2013-09-18 | Discharge: 2013-09-18 | Disposition: A | Discharge status: Home / Self Care | Payer: Medicare HMO | Source: Ambulatory Visit | Attending: Radiation Oncology | Admitting: Radiation Oncology

## 2013-09-18 ENCOUNTER — Encounter: Payer: Self-pay | Admitting: Radiation Oncology

## 2013-09-18 VITALS — BP 178/95 | HR 88 | Temp 97.6°F | Resp 20 | Wt 202.7 lb

## 2013-09-18 DIAGNOSIS — Z510 Encounter for antineoplastic radiation therapy: Secondary | ICD-10-CM | POA: Diagnosis not present

## 2013-09-18 DIAGNOSIS — C61 Malignant neoplasm of prostate: Secondary | ICD-10-CM

## 2013-09-18 NOTE — Progress Notes (Signed)
Weekly Management Note:  Site: Prostate Current Dose:  1560  cGy Projected Dose: 7800  cGy  Narrative: The patient is seen today for routine under treatment assessment. CBCT/MVCT images/port films were reviewed. The chart was reviewed.   Bladder filling is satisfactory. No GU or GI difficulties. We discussed his hypertension which may be related to excess salt intake this past weekend.  Physical Examination:  Filed Vitals:   09/18/13 0859  BP: 178/95  Pulse: 88  Temp: 97.6 F (36.4 C)  Resp: 20  .  Weight: 202 lb 11.2 oz (91.944 kg). No change.  Impression: Tolerating radiation therapy well.  Plan: Continue radiation therapy as planned.

## 2013-09-18 NOTE — Progress Notes (Addendum)
Pt denies pain, fatigue, loss of appetite, urinary/bowel issues. He continues to have hot flashes. Pt's BP high; he states he has not taken his BP meds this morning. Pt will check his BP at home today after he takes his medications.

## 2013-09-19 ENCOUNTER — Ambulatory Visit
Admission: RE | Admit: 2013-09-19 | Discharge: 2013-09-19 | Disposition: A | Discharge status: Home / Self Care | Payer: Medicare HMO | Source: Ambulatory Visit | Attending: Radiation Oncology | Admitting: Radiation Oncology

## 2013-09-19 DIAGNOSIS — Z510 Encounter for antineoplastic radiation therapy: Secondary | ICD-10-CM | POA: Diagnosis not present

## 2013-09-20 ENCOUNTER — Ambulatory Visit
Admission: RE | Admit: 2013-09-20 | Discharge: 2013-09-20 | Disposition: A | Discharge status: Home / Self Care | Payer: Medicare HMO | Source: Ambulatory Visit | Attending: Radiation Oncology | Admitting: Radiation Oncology

## 2013-09-20 DIAGNOSIS — Z510 Encounter for antineoplastic radiation therapy: Secondary | ICD-10-CM | POA: Diagnosis not present

## 2013-09-21 ENCOUNTER — Ambulatory Visit
Admission: RE | Admit: 2013-09-21 | Discharge: 2013-09-21 | Disposition: A | Discharge status: Home / Self Care | Payer: Medicare HMO | Source: Ambulatory Visit | Attending: Radiation Oncology | Admitting: Radiation Oncology

## 2013-09-21 DIAGNOSIS — Z510 Encounter for antineoplastic radiation therapy: Secondary | ICD-10-CM | POA: Diagnosis not present

## 2013-09-22 ENCOUNTER — Ambulatory Visit
Admission: RE | Admit: 2013-09-22 | Discharge: 2013-09-22 | Disposition: A | Discharge status: Home / Self Care | Payer: Medicare HMO | Source: Ambulatory Visit | Attending: Radiation Oncology | Admitting: Radiation Oncology

## 2013-09-22 DIAGNOSIS — Z510 Encounter for antineoplastic radiation therapy: Secondary | ICD-10-CM | POA: Diagnosis not present

## 2013-09-25 ENCOUNTER — Encounter: Payer: Self-pay | Admitting: Radiation Oncology

## 2013-09-25 ENCOUNTER — Ambulatory Visit
Admission: RE | Admit: 2013-09-25 | Discharge: 2013-09-25 | Disposition: A | Discharge status: Home / Self Care | Payer: Medicare HMO | Source: Ambulatory Visit | Attending: Radiation Oncology | Admitting: Radiation Oncology

## 2013-09-25 VITALS — BP 160/95 | HR 81 | Temp 97.8°F | Resp 20 | Wt 205.9 lb

## 2013-09-25 DIAGNOSIS — C61 Malignant neoplasm of prostate: Secondary | ICD-10-CM

## 2013-09-25 DIAGNOSIS — Z510 Encounter for antineoplastic radiation therapy: Secondary | ICD-10-CM | POA: Diagnosis not present

## 2013-09-25 NOTE — Progress Notes (Signed)
Pt denies pain, fatigue, loss of appetite, urinary issues. Her states he has occasional diarrhea which resolves on it's own.

## 2013-09-25 NOTE — Progress Notes (Signed)
   Weekly Management Note:  outpatient Current Dose:  25.35 Gy  Projected Dose: 78 Gy   Narrative:  The patient presents for routine under treatment assessment.  CBCT/MVCT images/Port film x-rays were reviewed.  The chart was checked. Occasional diarrhea, otherrwise no complaints  Physical Findings:  weight is 205 lb 14.4 oz (93.396 kg). His oral temperature is 97.8 F (36.6 C). His blood pressure is 160/95 and his pulse is 81. His respiration is 20.  NAD  Impression:  The patient is tolerating radiotherapy.  Plan:  Continue radiotherapy as planned. Imodium AD PRN diarrhea if it worsens.  ________________________________   Eppie Gibson, M.D.

## 2013-09-26 ENCOUNTER — Ambulatory Visit
Admission: RE | Admit: 2013-09-26 | Discharge: 2013-09-26 | Disposition: A | Discharge status: Home / Self Care | Payer: Medicare HMO | Source: Ambulatory Visit | Attending: Radiation Oncology | Admitting: Radiation Oncology

## 2013-09-26 DIAGNOSIS — Z510 Encounter for antineoplastic radiation therapy: Secondary | ICD-10-CM | POA: Diagnosis not present

## 2013-09-27 ENCOUNTER — Ambulatory Visit
Admission: RE | Admit: 2013-09-27 | Discharge: 2013-09-27 | Disposition: A | Discharge status: Home / Self Care | Payer: Medicare HMO | Source: Ambulatory Visit | Attending: Radiation Oncology | Admitting: Radiation Oncology

## 2013-09-27 DIAGNOSIS — Z510 Encounter for antineoplastic radiation therapy: Secondary | ICD-10-CM | POA: Diagnosis not present

## 2013-09-28 ENCOUNTER — Ambulatory Visit
Admission: RE | Admit: 2013-09-28 | Discharge: 2013-09-28 | Disposition: A | Discharge status: Home / Self Care | Payer: Medicare HMO | Source: Ambulatory Visit | Attending: Radiation Oncology | Admitting: Radiation Oncology

## 2013-09-28 DIAGNOSIS — Z510 Encounter for antineoplastic radiation therapy: Secondary | ICD-10-CM | POA: Diagnosis not present

## 2013-09-29 ENCOUNTER — Ambulatory Visit
Admission: RE | Admit: 2013-09-29 | Discharge: 2013-09-29 | Disposition: A | Discharge status: Home / Self Care | Payer: Medicare HMO | Source: Ambulatory Visit | Attending: Radiation Oncology | Admitting: Radiation Oncology

## 2013-09-29 DIAGNOSIS — Z510 Encounter for antineoplastic radiation therapy: Secondary | ICD-10-CM | POA: Diagnosis not present

## 2013-10-02 ENCOUNTER — Encounter: Payer: Self-pay | Admitting: Radiation Oncology

## 2013-10-02 ENCOUNTER — Ambulatory Visit
Admission: RE | Admit: 2013-10-02 | Discharge: 2013-10-02 | Disposition: A | Discharge status: Home / Self Care | Payer: Medicare HMO | Source: Ambulatory Visit | Attending: Radiation Oncology | Admitting: Radiation Oncology

## 2013-10-02 VITALS — BP 153/92 | HR 101 | Temp 97.8°F | Resp 20 | Wt 203.3 lb

## 2013-10-02 DIAGNOSIS — Z510 Encounter for antineoplastic radiation therapy: Secondary | ICD-10-CM | POA: Diagnosis not present

## 2013-10-02 DIAGNOSIS — C61 Malignant neoplasm of prostate: Secondary | ICD-10-CM

## 2013-10-02 NOTE — Progress Notes (Signed)
Weekly Management Note:  Site: Prostate Current Dose:  3510  cGy Projected Dose: 7800  cGy  Narrative: The patient is seen today for routine under treatment assessment. CBCT/MVCT images/port films were reviewed. The chart was reviewed.   Bladder filling is excellent. No significant GU or GI difficulty although he does have moderate  fatigue. He is bothered by hot flashes at night. He was given a trial of Paxil Dr. Gaynelle Arabian which was not helpful. She'll get back in touch with Dr. Gaynelle Arabian regarding another remedy.  Physical Examination:  Filed Vitals:   10/02/13 0904  BP: 153/92  Pulse: 101  Temp: 97.8 F (36.6 C)  Resp: 20  .  Weight: 203 lb 4.8 oz (92.216 kg). No change.  Impression: Tolerating radiation therapy well.  Plan: Continue radiation therapy as planned.

## 2013-10-02 NOTE — Progress Notes (Signed)
Pt denies pain, loss of appetite, urinary/bowel issues. He is fatigued, c/o hot flashes that wake him 4-5 x nightly. He stopped taking Paxil, states he could not tell it was helpful.

## 2013-10-03 ENCOUNTER — Ambulatory Visit
Admission: RE | Admit: 2013-10-03 | Discharge: 2013-10-03 | Disposition: A | Discharge status: Home / Self Care | Payer: Medicare HMO | Source: Ambulatory Visit | Attending: Radiation Oncology | Admitting: Radiation Oncology

## 2013-10-03 DIAGNOSIS — Z510 Encounter for antineoplastic radiation therapy: Secondary | ICD-10-CM | POA: Diagnosis not present

## 2013-10-04 ENCOUNTER — Ambulatory Visit
Admission: RE | Admit: 2013-10-04 | Discharge: 2013-10-04 | Disposition: A | Discharge status: Home / Self Care | Payer: Medicare HMO | Source: Ambulatory Visit | Attending: Radiation Oncology | Admitting: Radiation Oncology

## 2013-10-04 DIAGNOSIS — Z510 Encounter for antineoplastic radiation therapy: Secondary | ICD-10-CM | POA: Diagnosis not present

## 2013-10-05 ENCOUNTER — Ambulatory Visit
Admission: RE | Admit: 2013-10-05 | Discharge: 2013-10-05 | Disposition: A | Discharge status: Home / Self Care | Payer: Medicare HMO | Source: Ambulatory Visit | Attending: Radiation Oncology | Admitting: Radiation Oncology

## 2013-10-05 DIAGNOSIS — Z510 Encounter for antineoplastic radiation therapy: Secondary | ICD-10-CM | POA: Diagnosis not present

## 2013-10-06 ENCOUNTER — Ambulatory Visit
Admission: RE | Admit: 2013-10-06 | Discharge: 2013-10-06 | Disposition: A | Discharge status: Home / Self Care | Payer: Medicare HMO | Source: Ambulatory Visit | Attending: Radiation Oncology | Admitting: Radiation Oncology

## 2013-10-06 DIAGNOSIS — Z510 Encounter for antineoplastic radiation therapy: Secondary | ICD-10-CM | POA: Diagnosis not present

## 2013-10-09 ENCOUNTER — Ambulatory Visit
Admission: RE | Admit: 2013-10-09 | Discharge: 2013-10-09 | Disposition: A | Discharge status: Home / Self Care | Payer: Medicare HMO | Source: Ambulatory Visit | Attending: Radiation Oncology | Admitting: Radiation Oncology

## 2013-10-09 VITALS — BP 153/91 | HR 92 | Temp 97.4°F | Resp 20 | Wt 205.8 lb

## 2013-10-09 DIAGNOSIS — C61 Malignant neoplasm of prostate: Secondary | ICD-10-CM

## 2013-10-09 DIAGNOSIS — Z510 Encounter for antineoplastic radiation therapy: Secondary | ICD-10-CM | POA: Diagnosis not present

## 2013-10-09 NOTE — Progress Notes (Signed)
Patient denies pain, urinary/bowel issues except occasionally burning w/urinaiton. He is fatigued, continues to have hot flashes.

## 2013-10-09 NOTE — Progress Notes (Signed)
Weekly Management Note:  Site: Prostate Current Dose:  4485  cGy Projected Dose: 7800  cGy  Narrative: The patient is seen today for routine under treatment assessment. CBCT/MVCT images/port films were reviewed. The chart was reviewed.   Bladder filling is excellent. No new GU or GI difficulty. He continues to have moderate fatigue with hot flashes.  Physical Examination:  Filed Vitals:   10/09/13 0906  BP: 153/91  Pulse: 92  Temp: 97.4 F (36.3 C)  Resp: 20  .  Weight: 205 lb 12.8 oz (93.35 kg). No change.  Impression: Tolerating radiation therapy well.  Plan: Continue radiation therapy as planned.

## 2013-10-10 ENCOUNTER — Ambulatory Visit
Admission: RE | Admit: 2013-10-10 | Discharge: 2013-10-10 | Disposition: A | Discharge status: Home / Self Care | Payer: Medicare HMO | Source: Ambulatory Visit | Attending: Radiation Oncology | Admitting: Radiation Oncology

## 2013-10-10 DIAGNOSIS — Z510 Encounter for antineoplastic radiation therapy: Secondary | ICD-10-CM | POA: Diagnosis not present

## 2013-10-11 ENCOUNTER — Ambulatory Visit
Admission: RE | Admit: 2013-10-11 | Discharge: 2013-10-11 | Disposition: A | Discharge status: Home / Self Care | Payer: Medicare HMO | Source: Ambulatory Visit | Attending: Radiation Oncology | Admitting: Radiation Oncology

## 2013-10-11 DIAGNOSIS — Z510 Encounter for antineoplastic radiation therapy: Secondary | ICD-10-CM | POA: Diagnosis not present

## 2013-10-12 ENCOUNTER — Ambulatory Visit
Admission: RE | Admit: 2013-10-12 | Discharge: 2013-10-12 | Disposition: A | Discharge status: Home / Self Care | Payer: Medicare HMO | Source: Ambulatory Visit | Attending: Radiation Oncology | Admitting: Radiation Oncology

## 2013-10-12 DIAGNOSIS — Z510 Encounter for antineoplastic radiation therapy: Secondary | ICD-10-CM | POA: Diagnosis not present

## 2013-10-13 ENCOUNTER — Ambulatory Visit
Admission: RE | Admit: 2013-10-13 | Discharge: 2013-10-13 | Disposition: A | Discharge status: Home / Self Care | Payer: Medicare HMO | Source: Ambulatory Visit | Attending: Radiation Oncology | Admitting: Radiation Oncology

## 2013-10-13 DIAGNOSIS — Z510 Encounter for antineoplastic radiation therapy: Secondary | ICD-10-CM | POA: Diagnosis not present

## 2013-10-16 ENCOUNTER — Encounter: Payer: Self-pay | Admitting: Radiation Oncology

## 2013-10-16 ENCOUNTER — Ambulatory Visit
Admission: RE | Admit: 2013-10-16 | Discharge: 2013-10-16 | Disposition: A | Discharge status: Home / Self Care | Payer: Medicare HMO | Source: Ambulatory Visit | Attending: Radiation Oncology | Admitting: Radiation Oncology

## 2013-10-16 VITALS — BP 153/88 | HR 97 | Temp 97.6°F | Resp 20 | Wt 204.5 lb

## 2013-10-16 DIAGNOSIS — C61 Malignant neoplasm of prostate: Secondary | ICD-10-CM

## 2013-10-16 DIAGNOSIS — Z510 Encounter for antineoplastic radiation therapy: Secondary | ICD-10-CM | POA: Diagnosis not present

## 2013-10-16 NOTE — Progress Notes (Signed)
Weekly rad txs prostate 28/40 completd, nocturia x4, slight dysuria at times, no pain, energy level good,, does get tired,  hot flashes most c/o of, appetite good, drinks 8 glasses water daily 9:08 AM

## 2013-10-16 NOTE — Progress Notes (Signed)
   Weekly Management Note:  outpatient Current Dose:  54.6 Gy  Projected Dose: 78 Gy  prostate  Narrative:  The patient presents for routine under treatment assessment.  CBCT/MVCT images/Port film x-rays were reviewed.  The chart was checked. NAD, no new issues. Urinary function stable. + hot flashes  Physical Findings:  weight is 204 lb 8 oz (92.761 kg). His oral temperature is 97.6 F (36.4 C). His blood pressure is 153/88 and his pulse is 97. His respiration is 20.  NAD  Impression:  The patient is tolerating radiotherapy.  Plan:  Continue radiotherapy as planned.   ________________________________   Eppie Gibson, M.D.

## 2013-10-17 ENCOUNTER — Ambulatory Visit
Admission: RE | Admit: 2013-10-17 | Discharge: 2013-10-17 | Disposition: A | Discharge status: Home / Self Care | Payer: Medicare HMO | Source: Ambulatory Visit | Attending: Radiation Oncology | Admitting: Radiation Oncology

## 2013-10-17 DIAGNOSIS — Z510 Encounter for antineoplastic radiation therapy: Secondary | ICD-10-CM | POA: Diagnosis not present

## 2013-10-18 ENCOUNTER — Ambulatory Visit
Admission: RE | Admit: 2013-10-18 | Discharge: 2013-10-18 | Disposition: A | Discharge status: Home / Self Care | Payer: Medicare HMO | Source: Ambulatory Visit | Attending: Radiation Oncology | Admitting: Radiation Oncology

## 2013-10-18 DIAGNOSIS — Z510 Encounter for antineoplastic radiation therapy: Secondary | ICD-10-CM | POA: Diagnosis not present

## 2013-10-19 ENCOUNTER — Ambulatory Visit
Admission: RE | Admit: 2013-10-19 | Discharge: 2013-10-19 | Disposition: A | Discharge status: Home / Self Care | Payer: Medicare HMO | Source: Ambulatory Visit | Attending: Radiation Oncology | Admitting: Radiation Oncology

## 2013-10-19 DIAGNOSIS — Z510 Encounter for antineoplastic radiation therapy: Secondary | ICD-10-CM | POA: Diagnosis not present

## 2013-10-23 ENCOUNTER — Ambulatory Visit
Admission: RE | Admit: 2013-10-23 | Discharge: 2013-10-23 | Disposition: A | Discharge status: Home / Self Care | Payer: Medicare HMO | Source: Ambulatory Visit | Attending: Radiation Oncology | Admitting: Radiation Oncology

## 2013-10-23 ENCOUNTER — Encounter: Payer: Self-pay | Admitting: Radiation Oncology

## 2013-10-23 VITALS — BP 152/85 | HR 97 | Temp 98.2°F | Resp 20 | Wt 204.6 lb

## 2013-10-23 DIAGNOSIS — C61 Malignant neoplasm of prostate: Secondary | ICD-10-CM

## 2013-10-23 DIAGNOSIS — Z510 Encounter for antineoplastic radiation therapy: Secondary | ICD-10-CM | POA: Diagnosis not present

## 2013-10-23 NOTE — Progress Notes (Signed)
Weekly Management Note:  Site: Prostate Current Dose:  6240  cGy Projected Dose: 7800  cGy  Narrative: The patient is seen today for routine under treatment assessment. CBCT/MVCT images/port films were reviewed. The chart was reviewed.   Bladder filling is satisfactory. He is having more dysuria. Otherwise, no new GU or GI difficulties.  Physical Examination:  Filed Vitals:   10/23/13 0859  BP: 152/85  Pulse: 97  Temp: 98.2 F (36.8 C)  Resp: 20  .  Weight: 204 lb 9.6 oz (92.806 kg). No change.  Impression: Tolerating radiation therapy well, although he is having worsening dysuria/radiation urethritis/cystitis. I told he may take ibuprofen or Aleve when necessary for dysuria.  Plan: Continue radiation therapy as planned.

## 2013-10-23 NOTE — Progress Notes (Signed)
Weekly rad txs prostate, 32/40 completed, has nocturia x4 now, also hot flashes, and dysuria more frequently stated, bowels  Normal, drinks 6-8 glasses water  daily  9:01 AM

## 2013-10-24 ENCOUNTER — Ambulatory Visit
Admission: RE | Admit: 2013-10-24 | Discharge: 2013-10-24 | Disposition: A | Discharge status: Home / Self Care | Payer: Medicare HMO | Source: Ambulatory Visit | Attending: Radiation Oncology | Admitting: Radiation Oncology

## 2013-10-24 DIAGNOSIS — Z510 Encounter for antineoplastic radiation therapy: Secondary | ICD-10-CM | POA: Diagnosis not present

## 2013-10-25 ENCOUNTER — Ambulatory Visit
Admission: RE | Admit: 2013-10-25 | Discharge: 2013-10-25 | Disposition: A | Discharge status: Home / Self Care | Payer: Medicare HMO | Source: Ambulatory Visit | Attending: Radiation Oncology | Admitting: Radiation Oncology

## 2013-10-25 DIAGNOSIS — Z510 Encounter for antineoplastic radiation therapy: Secondary | ICD-10-CM | POA: Diagnosis not present

## 2013-10-26 ENCOUNTER — Ambulatory Visit
Admission: RE | Admit: 2013-10-26 | Discharge: 2013-10-26 | Disposition: A | Discharge status: Home / Self Care | Payer: Medicare HMO | Source: Ambulatory Visit | Attending: Radiation Oncology | Admitting: Radiation Oncology

## 2013-10-26 DIAGNOSIS — Z510 Encounter for antineoplastic radiation therapy: Secondary | ICD-10-CM | POA: Diagnosis not present

## 2013-10-27 ENCOUNTER — Ambulatory Visit
Admission: RE | Admit: 2013-10-27 | Discharge: 2013-10-27 | Disposition: A | Discharge status: Home / Self Care | Payer: Medicare HMO | Source: Ambulatory Visit | Attending: Radiation Oncology | Admitting: Radiation Oncology

## 2013-10-27 DIAGNOSIS — Z510 Encounter for antineoplastic radiation therapy: Secondary | ICD-10-CM | POA: Diagnosis not present

## 2013-10-30 ENCOUNTER — Ambulatory Visit
Admission: RE | Admit: 2013-10-30 | Discharge: 2013-10-30 | Disposition: A | Discharge status: Home / Self Care | Payer: Medicare HMO | Source: Ambulatory Visit | Attending: Radiation Oncology | Admitting: Radiation Oncology

## 2013-10-30 ENCOUNTER — Encounter: Payer: Self-pay | Admitting: Radiation Oncology

## 2013-10-30 VITALS — BP 150/81 | HR 94 | Temp 97.9°F | Resp 20 | Wt 203.4 lb

## 2013-10-30 DIAGNOSIS — C61 Malignant neoplasm of prostate: Secondary | ICD-10-CM

## 2013-10-30 DIAGNOSIS — Z510 Encounter for antineoplastic radiation therapy: Secondary | ICD-10-CM | POA: Diagnosis not present

## 2013-10-30 NOTE — Progress Notes (Signed)
Patient denies pain, bowel issues, loss of appetite. He states Aleve helps with dysuria; he is fatigued. Pt will complete this week, gave him 1 month FU card.

## 2013-10-30 NOTE — Progress Notes (Signed)
Weekly Management Note:  Site: Prostate Current Dose:  7215  cGy Projected Dose: 7800  cGy  Narrative: The patient is seen today for routine under treatment assessment. CBCT/MVCT images/port films were reviewed. The chart was reviewed.   Bladder filling is satisfactory. No new GU or GI difficulty.  Physical Examination:  Filed Vitals:   10/30/13 0919  BP: 150/81  Pulse: 94  Temp: 97.9 F (36.6 C)  Resp: 20  .  Weight: 203 lb 6.4 oz (92.262 kg). No change.  Impression: Tolerating radiation therapy well. He'll finish his treatment this Thursday.  Plan: Continue radiation therapy as planned.

## 2013-10-31 ENCOUNTER — Ambulatory Visit
Admission: RE | Admit: 2013-10-31 | Discharge: 2013-10-31 | Disposition: A | Discharge status: Home / Self Care | Payer: Medicare HMO | Source: Ambulatory Visit | Attending: Radiation Oncology | Admitting: Radiation Oncology

## 2013-10-31 DIAGNOSIS — Z510 Encounter for antineoplastic radiation therapy: Secondary | ICD-10-CM | POA: Diagnosis not present

## 2013-11-01 ENCOUNTER — Ambulatory Visit
Admission: RE | Admit: 2013-11-01 | Discharge: 2013-11-01 | Disposition: A | Discharge status: Home / Self Care | Payer: Medicare HMO | Source: Ambulatory Visit | Attending: Radiation Oncology | Admitting: Radiation Oncology

## 2013-11-01 DIAGNOSIS — Z510 Encounter for antineoplastic radiation therapy: Secondary | ICD-10-CM | POA: Diagnosis not present

## 2013-11-02 ENCOUNTER — Ambulatory Visit
Admission: RE | Admit: 2013-11-02 | Discharge: 2013-11-02 | Disposition: A | Discharge status: Home / Self Care | Payer: Medicare HMO | Source: Ambulatory Visit | Attending: Radiation Oncology | Admitting: Radiation Oncology

## 2013-11-02 ENCOUNTER — Encounter: Payer: Self-pay | Admitting: Radiation Oncology

## 2013-11-02 DIAGNOSIS — Z510 Encounter for antineoplastic radiation therapy: Secondary | ICD-10-CM | POA: Diagnosis not present

## 2013-11-02 NOTE — Addendum Note (Signed)
Encounter addended by: Andria Rhein, RN on: 11/02/2013 11:10 AM<BR>     Documentation filed: Notes Section

## 2013-11-02 NOTE — Progress Notes (Deleted)
Weekly PUT Progress Note - Prostate  Bladder   frequency and urgency  Bowel 3 and diarrhea: 24 times in last 24hrs  Medications taken: Imodium  Pain No: abdomin, groin Pain score:   (add pain and location from doc flowsheet: ONC AMB COMPLEX VITALS. Also update the location in the docflowsheet to match the navigator)  Fatigue No  Loss of appetite Yes

## 2013-11-02 NOTE — Progress Notes (Signed)
error 

## 2013-11-02 NOTE — Progress Notes (Signed)
[facility] End of Treatment Note  Name:Robert Boyle  Date: 11/02/2013 ZOX:[id number] DOB:[birth year]   Status:outpatient    CC: Chesley Noon, MD  Dr. Katrine Coho  REFERRING PHYSICIAN: Dr. Katrine Coho   DIAGNOSIS: Stage TI C. intermediate risk adenocarcinoma prostate  INDICATION FOR TREATMENT: Curative   TREATMENT DATES: 09/06/2013 through 11/02/2013                          SITE/DOSE:   Prostate 7600 cGy in 40 sessions, seminal vesicles 5600 cGy in 40 sessions                         BEAMS/ENERGY:   Dual ARC VMAT IMRT with 6 MV photon                NARRATIVE:   The patient tolerated treatment well with only mild dysuria by completion of therapy. Of note is that he is on short-term androgen deprivation therapy through Dr. Gaynelle Arabian.                         PLAN: Routine followup in one month. Patient instructed to call if questions or worsening complaints in interim.

## 2013-12-01 ENCOUNTER — Encounter: Payer: Self-pay | Admitting: *Deleted

## 2013-12-06 ENCOUNTER — Ambulatory Visit
Admission: RE | Admit: 2013-12-06 | Discharge: 2013-12-06 | Disposition: A | Discharge status: Home / Self Care | Payer: Medicare HMO | Source: Ambulatory Visit | Attending: Radiation Oncology | Admitting: Radiation Oncology

## 2013-12-06 VITALS — BP 159/92 | HR 95 | Temp 97.5°F | Resp 20 | Wt <= 1120 oz

## 2013-12-06 DIAGNOSIS — C61 Malignant neoplasm of prostate: Secondary | ICD-10-CM

## 2013-12-06 NOTE — Progress Notes (Signed)
Patient denies pain, loss of appetite. He continues to have fatigue, nocturia every 2 hours, loose stools. He takes Imodium AD prn. He denies other urinary issues. He reports ongoing hot flashes. He does not have follow up with DrTannenbaum at this time.

## 2013-12-06 NOTE — Progress Notes (Signed)
CC: Dr. Katrine Coho  Followup note:  Robert Boyle returns today approximately 1 month following completion of external beam/IMRT in the management of his stage T1c intermediate risk adenocarcinoma prostate. He is also receiving short-term androgen deprivation therapy. His last Depo-Lupron injection was on 07/20/2013. He continues to have fatigue as expected. He does have nocturia every 2 hours and his baseline was once a night. His force of stream is improved. His bowels are occasionally loose and he takes Imodium when necessary.  Physical examination: Alert and oriented. Filed Vitals:   12/06/13 0923  BP: 159/92  Pulse: 95  Temp: 97.5 F (36.4 C)  Resp: 20   Rectal examination not performed today.  Impression: Satisfactory progress, but I suspect that he still has some bladder irritability/radiation cystitis which will hopefully improve over the next one to 2 months. He understands that his androgen deprivation therapy is ongoing and even though it is therapeutic through early October, it may take many months after October before we see any return of his testosterone and improvement of his fatigue.  Plan: Followup with Dr. Gaynelle Boyle in 1-2 months. I've not scheduled the patient for a formal followup visit and I ask that Dr. Gaynelle Boyle keep me posted on his progress.

## 2014-02-16 ENCOUNTER — Telehealth: Payer: Self-pay | Admitting: Cardiovascular Disease

## 2014-02-16 DIAGNOSIS — I2510 Atherosclerotic heart disease of native coronary artery without angina pectoris: Secondary | ICD-10-CM

## 2014-02-16 NOTE — Telephone Encounter (Signed)
New message     Received notes from Dr Anastasia Pall to schedule appt for pt to see Dr Sallyanne Kuster for dyspnea on exertion.  When I called him to make an appt---he states he needs a stress test.  I told him I needed an order from Dr Melford Aase or Dr Sallyanne Kuster.  Pt then said he wanted to just talk to the nurse.  Ok to call Monday.  I will fax what I have to [facility].

## 2014-02-16 NOTE — Telephone Encounter (Signed)
It's reasonable to order a stress test, but I also want to see him in the office.

## 2014-02-16 NOTE — Telephone Encounter (Signed)
Will send this message to Dr. Loletha Grayer for review and advise if patient needs a stress test and will notify the patient Monday.

## 2014-02-19 NOTE — Telephone Encounter (Signed)
R/S Myoview ordered and will be done before his appt for dyspnea.  Info given to scheduling.

## 2014-02-23 ENCOUNTER — Telehealth (HOSPITAL_COMMUNITY): Payer: Self-pay

## 2014-02-23 NOTE — Telephone Encounter (Signed)
Encounter complete. 

## 2014-02-28 ENCOUNTER — Ambulatory Visit (HOSPITAL_COMMUNITY)
Admission: RE | Admit: 2014-02-28 | Discharge: 2014-02-28 | Disposition: A | Discharge status: Home / Self Care | Payer: Medicare HMO | Source: Ambulatory Visit | Attending: Cardiovascular Disease | Admitting: Cardiovascular Disease

## 2014-02-28 DIAGNOSIS — Z8249 Family history of ischemic heart disease and other diseases of the circulatory system: Secondary | ICD-10-CM | POA: Diagnosis not present

## 2014-02-28 DIAGNOSIS — I2510 Atherosclerotic heart disease of native coronary artery without angina pectoris: Secondary | ICD-10-CM

## 2014-02-28 DIAGNOSIS — E785 Hyperlipidemia, unspecified: Secondary | ICD-10-CM | POA: Insufficient documentation

## 2014-02-28 DIAGNOSIS — R5383 Other fatigue: Secondary | ICD-10-CM | POA: Diagnosis not present

## 2014-02-28 DIAGNOSIS — E663 Overweight: Secondary | ICD-10-CM | POA: Insufficient documentation

## 2014-02-28 DIAGNOSIS — I10 Essential (primary) hypertension: Secondary | ICD-10-CM | POA: Diagnosis not present

## 2014-02-28 DIAGNOSIS — R0609 Other forms of dyspnea: Secondary | ICD-10-CM | POA: Insufficient documentation

## 2014-02-28 DIAGNOSIS — Z87891 Personal history of nicotine dependence: Secondary | ICD-10-CM | POA: Diagnosis not present

## 2014-02-28 MED ORDER — TECHNETIUM TC 99M SESTAMIBI GENERIC - CARDIOLITE
10.5000 | Freq: Once | INTRAVENOUS | Status: AC | PRN
  Administered 2014-02-28: 11 via INTRAVENOUS

## 2014-02-28 MED ORDER — AMINOPHYLLINE 25 MG/ML IV SOLN
100.0000 mg | Freq: Once | INTRAVENOUS | Status: AC
  Administered 2014-02-28: 100 mg via INTRAVENOUS

## 2014-02-28 MED ORDER — REGADENOSON 0.4 MG/5ML IV SOLN
0.4000 mg | Freq: Once | INTRAVENOUS | Status: AC
  Administered 2014-02-28: 0.4 mg via INTRAVENOUS

## 2014-02-28 MED ORDER — TECHNETIUM TC 99M SESTAMIBI GENERIC - CARDIOLITE
30.7000 | Freq: Once | INTRAVENOUS | Status: AC | PRN
  Administered 2014-02-28: 30.7 via INTRAVENOUS

## 2014-02-28 NOTE — Procedures (Addendum)
[facility] [address] [place] [address] [phone]  Cardiology Nuclear Med Study  Robert Boyle is a 78 y.o. male     MRN : [id number]     DOB: 1936/02/12  Procedure Date: 02/28/2014  Nuclear Med Background Indication for Stress Test:  Evaluation for Ischemia and Stent Patency History:  CAD;STENT/PTCA-2000;Last NUC MPI on 11/06/2011;EF=62% Cardiac Risk Factors: Family History - CAD, History of Smoking, Hypertension, Lipids and Overweight  Symptoms:  DOE and Fatigue   Nuclear Pre-Procedure Caffeine/Decaff Intake:  1:00am NPO After: 11am   IV Site: R Forearm  IV 0.9% NS with Angio Cath:  22g  Chest Size (in):  46"  IV Started by: Rolene Course, RN  Height: 6\' 1"  (1.854 m)  Cup Size: n/a  BMI:  Body mass index is 26.79 kg/(m^2). Weight:  203 lb (92.08 kg)   Tech Comments:  Pt. Unable to walk d/t marked SOB; changed to [employer] Med Study 1 or 2 day study: 1 day  Stress Test Type:  [place] Provider:  Sanda Klein, MD   Resting Radionuclide: Technetium 70m Sestamibi  Resting Radionuclide Dose: 10.5 mCi   Stress Radionuclide:  Technetium 81m Sestamibi  Stress Radionuclide Dose: 30.7 mCi           Stress Protocol Rest HR: 90 Stress HR: 103  Rest BP: 181/91 Stress BP: 190/89  Exercise Time (min): n/a METS: n/a   Predicted Max HR: 145 bpm % Max HR: 74.48 bpm Rate Pressure Product: 20520  Dose of Adenosine (mg):  n/a Dose of Lexiscan: 0.4 mg  Dose of Atropine (mg): n/a Dose of Dobutamine: n/a mcg/kg/min (at max HR)  Stress Test Technologist: Leane Para, CCT Nuclear Technologist: Imagene Riches, CNMT   Rest Procedure:  Myocardial perfusion imaging was performed at rest 45 minutes following the intravenous administration of Technetium 50m Sestamibi. Stress Procedure:  The patient received IV Lexiscan 0.4 mg over 15-seconds.  Technetium 48m Sestamibi injected IV at  30-seconds. Patient experienced SOB and 100 mg Aminophylline IV was administered. There were no significant changes with Lexiscan.  Quantitative spect images were obtained after a 45 minute delay.  Transient Ischemic Dilatation (Normal <1.22):  1.18  QGS EDV:  97 ml QGS ESV:  48 ml LV Ejection Fraction: 51%        Rest ECG: NSR - Normal EKG  Stress ECG: No significant change from baseline ECG  QPS Raw Data Images:  Normal; no motion artifact; normal heart/lung ratio. Stress Images:  There is decreased uptake in the inferior wall. Rest Images:  Normal homogeneous uptake in all areas of the myocardium. Subtraction (SDS):  These findings are consistent with ischemia.  Impression Exercise Capacity:  Lexiscan with no exercise. BP Response:  Normal blood pressure response. Clinical Symptoms:  No significant symptoms noted. ECG Impression:  No significant ST segment change suggestive of ischemia. Comparison with Prior Nuclear Study: New Inferior ischemic abnormality since prior study  Overall Impression:  Intermediate risk stress nuclear study There is a new ischemic abnormality that small in size and moderate in intenity. Follow up with Dr. Sallyanne Kuster.  LV Wall Motion:  Preserved LV fxn with inferior hypokinesis   Lorretta Harp, MD  02/28/2014 5:37 PM

## 2014-03-04 HISTORY — PX: TRANSTHORACIC ECHOCARDIOGRAM: SHX275

## 2014-03-05 ENCOUNTER — Telehealth: Payer: Self-pay | Admitting: Cardiovascular Disease

## 2014-03-05 NOTE — Telephone Encounter (Signed)
Spoke with pt, aware of low risk study and the need to keep his follow up with dr c to discuss these results

## 2014-03-05 NOTE — Telephone Encounter (Signed)
Pt would like stress test results from 02-28-14 please.

## 2014-03-12 ENCOUNTER — Encounter: Payer: Self-pay | Admitting: Cardiology

## 2014-03-12 ENCOUNTER — Ambulatory Visit (INDEPENDENT_AMBULATORY_CARE_PROVIDER_SITE_OTHER): Payer: Medicare HMO | Admitting: Cardiology

## 2014-03-12 VITALS — BP 152/76 | HR 94 | Ht 73.0 in | Wt 211.7 lb

## 2014-03-12 DIAGNOSIS — I10 Essential (primary) hypertension: Secondary | ICD-10-CM

## 2014-03-12 DIAGNOSIS — I208 Other forms of angina pectoris: Secondary | ICD-10-CM

## 2014-03-12 DIAGNOSIS — R5383 Other fatigue: Secondary | ICD-10-CM

## 2014-03-12 DIAGNOSIS — R635 Abnormal weight gain: Secondary | ICD-10-CM

## 2014-03-12 DIAGNOSIS — I209 Angina pectoris, unspecified: Secondary | ICD-10-CM

## 2014-03-12 DIAGNOSIS — I2510 Atherosclerotic heart disease of native coronary artery without angina pectoris: Secondary | ICD-10-CM

## 2014-03-12 DIAGNOSIS — E785 Hyperlipidemia, unspecified: Secondary | ICD-10-CM

## 2014-03-12 DIAGNOSIS — Z79899 Other long term (current) drug therapy: Secondary | ICD-10-CM

## 2014-03-12 DIAGNOSIS — D689 Coagulation defect, unspecified: Secondary | ICD-10-CM

## 2014-03-12 DIAGNOSIS — Z9861 Coronary angioplasty status: Secondary | ICD-10-CM

## 2014-03-12 DIAGNOSIS — R9439 Abnormal result of other cardiovascular function study: Secondary | ICD-10-CM

## 2014-03-12 DIAGNOSIS — R931 Abnormal findings on diagnostic imaging of heart and coronary circulation: Secondary | ICD-10-CM

## 2014-03-12 MED ORDER — NITROGLYCERIN 0.4 MG SL SUBL: 0.4000 mg | SUBLINGUAL_TABLET | SUBLINGUAL | Status: DC | PRN

## 2014-03-12 MED ORDER — METOPROLOL SUCCINATE ER 25 MG PO TB24: 25.0000 mg | ORAL_TABLET | Freq: Every day | ORAL | Status: DC

## 2014-03-12 MED ORDER — ISOSORBIDE MONONITRATE ER 30 MG PO TB24: 30.0000 mg | ORAL_TABLET | Freq: Every day | ORAL | Status: DC

## 2014-03-12 NOTE — Patient Instructions (Signed)
Right Radial Cath with Dr. Ellyn Hack Tuesday 03/20/14 - Your physician has requested that you have a cardiac catheterization. Cardiac catheterization is used to diagnose and/or treat various heart conditions. Doctors may recommend this procedure for a number of different reasons. The most common reason is to evaluate chest pain. Chest pain can be a symptom of coronary artery disease (CAD), and cardiac catheterization can show whether plaque is narrowing or blocking your heart's arteries. This procedure is also used to evaluate the valves, as well as measure the blood flow and oxygen levels in different parts of your heart. For further information please visit [email or web address].   Following your catheterization, you will not be allowed to drive for 3 days.  No lifting, pushing, or pulling greater that 10 pounds is allowed for 1 week.  You will be required to have the following tests prior to the procedure:  1. Blood work-the blood work can be done no more than 7 days prior to the procedure.  It can be done at any [facility] lab.  There is one downstairs on the first floor of this building and one in the [place] ([address])  Dr. Ellyn Hack has also prescribed you several new medications:  Start taking  IMDUR 30 MG Daily Metoprolol Succinate 25 MG Daily  And as needed for chest pain/tightness  Take 0.4mg  of Nitroglycerin

## 2014-03-13 ENCOUNTER — Telehealth: Payer: Self-pay | Admitting: Cardiovascular Disease

## 2014-03-13 ENCOUNTER — Encounter: Payer: Self-pay | Admitting: Cardiology

## 2014-03-13 DIAGNOSIS — R9439 Abnormal result of other cardiovascular function study: Secondary | ICD-10-CM | POA: Insufficient documentation

## 2014-03-13 DIAGNOSIS — R635 Abnormal weight gain: Secondary | ICD-10-CM | POA: Insufficient documentation

## 2014-03-13 DIAGNOSIS — I209 Angina pectoris, unspecified: Secondary | ICD-10-CM | POA: Insufficient documentation

## 2014-03-13 DIAGNOSIS — R5383 Other fatigue: Secondary | ICD-10-CM | POA: Insufficient documentation

## 2014-03-13 NOTE — Assessment & Plan Note (Signed)
Partially because of inactivity related to his recent prolonged chemotherapy and radiation course, he has gained weight, total of 12 pounds since last August.  This may be contributing to his exertional dyspnea and fatigue, but has not been able to be as active as usual now because of his dyspnea. Hopefully when this is addressed he will be on being more active exercise or lose weight. We talked about dietary modifications as well.

## 2014-03-13 NOTE — Telephone Encounter (Signed)
Darla called in needig a CPT- 10 code for this pt. Please call  Thanks

## 2014-03-13 NOTE — Assessment & Plan Note (Signed)
In addition to being dyspneic, he is also having worsening fatigue. Will check TSH along with his precath labs.

## 2014-03-13 NOTE — Progress Notes (Signed)
PCP: Chesley Noon, MD  Clinic Note: Chief Complaint  Patient presents with  . Follow-up    02/28/14 Abnormal Stress Test  . Shortness of Breath   HPI: Robert Boyle is a 78 y.o. male with a PMH below who presents today for for worsening dyspnea. He is a patient of Dr. Aldona Bar, is seeing Dr. Sallyanne Kuster 1 time since Dr. Eddie Dibbles retirement on 12/13/2012. The patient has a history of coronary disease status post bare-metal stent placed to D1 8 2000 in the setting of an abnormal stress test. Again repeat  By catheterization one year later and had in-stent restenosis treated with PTCA with a 2.5 mm balloon.  Since then he has not had any significant anginal symptoms. He has been undergoing radiation and chemotherapy for prostate cancer the last year and subsequently not been seen yet this year by Dr. Sallyanne Kuster. May through July.    Over the past month or so he has noted progressively worsening exertional dyspnea most notable when he goes up a flight of steps or up a hill. He has got progressively worse to the point where now walking in from the waiting room to the clinical room made him profoundly dyspneic. This past weekend he was walking back to his car from a sporting event and had 2 walk up a slight hill, but was unable to do so and had a car drive down to get him. At that particular time, he noticed a little discomfort in his chest but for the most part of his dyspnea.  He is not wanting to play golf in the last 2 months because of dyspnea. He denies any PND orthopnea, or any significant edema. He was eating dinner last month with Dr. Rex Kras, who noted dyspnea while eating his meal. Based on Dr. Eddie Dibbles recommendations, he went to see his PCP Dr. Melford Aase who recommended that he set up appointment to see Dr. Sallyanne Kuster for dyspnea on exertion. The recommendation was made that he go ahead and have a stress test done prior to seeing Dr. Sallyanne Kuster.  Stress test was performed on November 11 and  was read as INTERMEDIATE RISK with new ischemia in the inferior-inferolateral region. This was read as being a small in size moderate intensity region, however on my evaluation appeared in more moderate to large in size with moderate intensity partially reversible defect.  Otherwise cardiac standpoint he denies any rapid irregular heartbeat/palpitations, lightheadedness, dizziness, CT/near syncope or TIA/amaurosis fugax symptoms. The white count he feels a little lightheaded when he gets profoundly dyspneic. No melena, hematochezia, epistaxis. He has not had any hematuria in the past month.    Past Medical History  Diagnosis Date  . Essential hypertension   . Hyperlipidemia with target LDL less than 70   . CAD S/P percutaneous coronary angioplasty 2000; 2001    a) BMS to D1 75% stenosis (~2.5 mm x 18 mm AVE BMS), mid LAD ~40-50%, mRCA 30-40%, small Cx;; b) 75% ISR of D1 stent - PTCA with 2.5 mm x 15 mm balloon;; c) Myoview 7/'13: EF 63% is inferoseptal gut attenuation artifact but no reversible ischemia  . Gastrointestinal complaints   . Prostate cancer 05/23/13    gleason 4+3=7, vloume 34.32 cc; s/p Chemo-XRT  . History of radiation therapy 09/06/13- 11/02/13    prostate 7600 cGy 40 sessions, seminal vesicles 5600 cGy 40 sessions  . Gout   . Arthritis   . GERD (gastroesophageal reflux disease)   . H/O renal calculi  Prior Cardiac Evaluation and Past Surgical History: Past Surgical History  Procedure Laterality Date  . Coronary angioplasty with stent placement  10/17/1998    PCI with ~2.5 mm x 18 mm AVE BMS to D1  . Coronary angioplasty  07/21/1999    PTCA to ISR stenosis to LAD  . Nm myocar perf wall motion  11/06/2011    low risk 63% ; FIXED INFEROSEPTAL GUT ATTENUATION  . Prostate biopsy  05/23/13    gleason 4+3=7, vol 34.32 cc  . Cholecystectomy  01/22/10  . Back surgery  1995    lumbar    ROS: A comprehensive was performed. Review of Systems  Constitutional: Negative for weight  loss and malaise/fatigue.  HENT: Negative for nosebleeds.   Eyes: Negative for blurred vision and double vision.  Respiratory: Positive for shortness of breath. Negative for cough and wheezing.   Cardiovascular: Negative for palpitations, claudication and leg swelling.  Gastrointestinal: Negative for blood in stool and melena.  Genitourinary: Negative for hematuria.  Musculoskeletal: Positive for joint pain.       Knees & hip; No recent Gout flare-up  Neurological: Positive for dizziness. Negative for sensory change, speech change, focal weakness, seizures, loss of consciousness and headaches.  Endo/Heme/Allergies: Does not bruise/bleed easily.  Psychiatric/Behavioral: Negative for depression and memory loss. The patient is not nervous/anxious.   All other systems reviewed and are negative.   Current Outpatient Prescriptions on File Prior to Visit  Medication Sig Dispense Refill  . allopurinol (ZYLOPRIM) 300 MG tablet Take 300 mg by mouth daily.    Marland Kitchen amLODipine (NORVASC) 5 MG tablet Take 5 mg by mouth daily.    Marland Kitchen aspirin 325 MG EC tablet Take 325 mg by mouth daily.    Marland Kitchen atorvastatin (LIPITOR) 80 MG tablet Take 80 mg by mouth daily.    . celecoxib (CELEBREX) 200 MG capsule Take 200 mg by mouth daily.    . furosemide (LASIX) 20 MG tablet Take 20 mg by mouth daily.     No current facility-administered medications on file prior to visit.   ALLERGIES REVIEWED IN EPIC -- No change  History   Social History Narrative   Retired.    Divorced, but lives with his ex-wife.   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 No tobacco products.   Plays golf with his friends and sons.   Family History  Problem Relation Age of Onset  . Heart attack Father   . Stroke Mother   . Heart attack Brother   . Cancer Brother     one brother-prostate    Wt Readings from Last 3 Encounters:  03/12/14 211 lb 11.2 oz (96.026 kg)  02/28/14 203 lb (92.08 kg)  12/06/13 20 lb 6.4 oz (9.253 kg)    PHYSICAL  EXAM BP 152/76 mmHg  Pulse 94  Ht 6\' 1"  (1.854 m)  Wt 211 lb 11.2 oz (96.026 kg)  BMI 27.94 kg/m2 General appearance: alert, cooperative, appears stated age, no distress and borderline obese Neck: no adenopathy, no carotid bruit and no JVD Lungs: clear to auscultation bilaterally, normal percussion bilaterally and non-labored Heart: regular rate and rhythm, S1, S2 normal, no murmur, click, rub or gallop; nondisplaced PMI Abdomen: soft, non-tender; bowel sounds normal; no masses,  no organomegaly; mild truncal obesity Extremities: extremities normal, atraumatic, no cyanosis,or edema Pulses: 2+ and symmetric; Skin: mobility and turgor normal Neurologic: Mental status: Alert, oriented, thought content appropriate Cranial nerves: normal (II-XII grossly intact)    Adult ECG Report - not performed  because of recent stress test  Recent Labs:      Chemistry      Component Value Date/Time   NA 136 01/22/2010 1351   K 3.8 01/22/2010 1351   CL 100 01/22/2010 1351   CO2 29 01/22/2010 1351   BUN 15 01/22/2010 1351   CREATININE 1.19 01/22/2010 1351      Component Value Date/Time   CALCIUM 9.2 01/22/2010 1351   ALKPHOS 95 01/22/2010 1351   AST 36 01/22/2010 1351   ALT 36 01/22/2010 1351   BILITOT 1.1 01/22/2010 1351     No results found for: CHOL, HDL, LDLCALC, LDLDIRECT, TRIG, CHOLHDL  ASSESSMENT / PLAN: Angina, class IV I'm concerned, but it is progressively worsening exertional dyspnea is consistent with class IV anginal symptoms. Simply getting short of breath walking into the clinic room with an abnormal Myoview stress test is very concerning for possible RCA distribution ischemia. He did have some RCA disease in 2001 as well as some LAD disease.  Plan:  Add Imdur 30 mg daily with when necessary nitroglycerin  Add metoprolol succinate 25 mg daily  Plan Left Heart Catheterization Plus Minus PCI on December 1 - right radial, by me  Performing MD:  Leonie Man, M.D.,  M.S.  Procedure:  Left Heart Catheterization With Coronary Angiography +/-  PCI  The procedure with Risks/Benefits/Alternatives and Indications was reviewed with the patient & family.  All questions were answered.    Risks / Complications include, but not limited to: Death, MI, CVA/TIA, VF/VT (with defibrillation), Bradycardia (need for temporary pacer placement), contrast induced nephropathy, bleeding / bruising / hematoma / pseudoaneurysm, vascular or coronary injury (with possible emergent CT or Vascular Surgery), adverse medication reactions, infection.  Additional risks involving the use of radiation with the possibility of radiation burns and cancer were explained in detail.  The patient and family voice understanding and agree to proceed.     CAD STATUS post bare-metal stent to diagonal 2000 ([id number] 2.5x18), treated for in stent restenosis 2001 He doesn't remember the symptoms he had back in 2000 and 2001. He certainly is having profound dyspnea and fatigue that is concerning for class IV angina. At the same time he is on high dose Lipitor as well as full dose aspirin and amlodipine. He also takes a single dose of Lasix. For medical optimization I may add beta blockers and long-acting nitrate & PRN NTG.  Essential hypertension Elevated blood pressure today was 152/76, he also heart rate 94 simply walking in from the waiting area. He has not yet room to add beta blocker to his calcium channel blocker.  Plan: Add Toprol 25 mg daily plus Imdur 30 mg daily  Hyperlipidemia with target LDL less than 70 Routinely checked by PCP. I don't relatively recent results. He is on high-dose Lipitor and tolerating it well.  Other fatigue In addition to being dyspneic, he is also having worsening fatigue. Will check TSH along with his precath labs.  Weight gain Partially because of inactivity related to his recent prolonged chemotherapy and radiation course, he has gained weight, total of 12 pounds  since last August.  This may be contributing to his exertional dyspnea and fatigue, but has not been able to be as active as usual now because of his dyspnea. Hopefully when this is addressed he will be on being more active exercise or lose weight. We talked about dietary modifications as well.    Orders Placed This Encounter  Procedures  . Basic metabolic panel  .  CBC  . Protime-INR  . APTT  . TSH  . LEFT HEART CATHETERIZATION WITH CORONARY ANGIOGRAM    Followup: 2-3 weeks post Cath -- can be with APP or me.  The patient has requested to simply switch over to having his cardiologist, since I will be performing the catheterization.  I will confer with Dr. Sallyanne Kuster.   Leonie Man, M.D., M.S. Interventional Cardiologist   Pager # [phone]

## 2014-03-13 NOTE — Telephone Encounter (Signed)
I've called the service center and have left three messages about this without a response, they called wanting a cpt code

## 2014-03-13 NOTE — Assessment & Plan Note (Signed)
He doesn't remember the symptoms he had back in 2000 and 2001. He certainly is having profound dyspnea and fatigue that is concerning for class IV angina. At the same time he is on high dose Lipitor as well as full dose aspirin and amlodipine. He also takes a single dose of Lasix. For medical optimization I may add beta blockers and long-acting nitrate & PRN NTG.

## 2014-03-13 NOTE — Telephone Encounter (Signed)
I have called darla and i'm waiting for a call back

## 2014-03-13 NOTE — Assessment & Plan Note (Signed)
Elevated blood pressure today was 152/76, he also heart rate 94 simply walking in from the waiting area. He has not yet room to add beta blocker to his calcium channel blocker.  Plan: Add Toprol 25 mg daily plus Imdur 30 mg daily

## 2014-03-13 NOTE — Assessment & Plan Note (Signed)
Routinely checked by PCP. I don't relatively recent results. He is on high-dose Lipitor and tolerating it well.

## 2014-03-13 NOTE — Assessment & Plan Note (Signed)
I'm concerned, but it is progressively worsening exertional dyspnea is consistent with class IV anginal symptoms. Simply getting short of breath walking into the clinic room with an abnormal Myoview stress test is very concerning for possible RCA distribution ischemia. He did have some RCA disease in 2001 as well as some LAD disease.  Plan:  Add Imdur 30 mg daily with when necessary nitroglycerin  Add metoprolol succinate 25 mg daily  Plan Left Heart Catheterization Plus Minus PCI on December 1 - right radial, by me  Performing MD:  Leonie Man, M.D., M.S.  Procedure:  Left Heart Catheterization With Coronary Angiography +/-  PCI  The procedure with Risks/Benefits/Alternatives and Indications was reviewed with the patient & family.  All questions were answered.    Risks / Complications include, but not limited to: Death, MI, CVA/TIA, VF/VT (with defibrillation), Bradycardia (need for temporary pacer placement), contrast induced nephropathy, bleeding / bruising / hematoma / pseudoaneurysm, vascular or coronary injury (with possible emergent CT or Vascular Surgery), adverse medication reactions, infection.  Additional risks involving the use of radiation with the possibility of radiation burns and cancer were explained in detail.  The patient and family voice understanding and agree to proceed.

## 2014-03-14 LAB — CBC
HCT: 37.8 % — ABNORMAL LOW (ref 39.0–52.0)
HEMOGLOBIN: 13.2 g/dL (ref 13.0–17.0)
MCH: 32 pg (ref 26.0–34.0)
MCHC: 34.9 g/dL (ref 30.0–36.0)
MCV: 91.5 fL (ref 78.0–100.0)
MPV: 9 fL — ABNORMAL LOW (ref 9.4–12.4)
Platelets: 146 10*3/uL — ABNORMAL LOW (ref 150–400)
RBC: 4.13 MIL/uL — AB (ref 4.22–5.81)
RDW: 15.1 % (ref 11.5–15.5)
WBC: 5.9 10*3/uL (ref 4.0–10.5)

## 2014-03-14 LAB — BASIC METABOLIC PANEL WITH GFR
BUN: 19 mg/dL (ref 6–23)
CO2: 25 meq/L (ref 19–32)
Calcium: 9.1 mg/dL (ref 8.4–10.5)
Chloride: 108 meq/L (ref 96–112)
Creat: 1.17 mg/dL (ref 0.50–1.35)
Glucose, Bld: 76 mg/dL (ref 70–99)
Potassium: 4 meq/L (ref 3.5–5.3)
Sodium: 143 meq/L (ref 135–145)

## 2014-03-14 LAB — TSH: TSH: 4.683 u[IU]/mL — ABNORMAL HIGH (ref 0.350–4.500)

## 2014-03-15 LAB — PROTIME-INR
INR: 0.95 (ref <1.50)
PROTHROMBIN TIME: 12.7 s (ref 11.6–15.2)

## 2014-03-15 LAB — APTT: aPTT: 28 seconds (ref 24–37)

## 2014-03-19 ENCOUNTER — Telehealth: Payer: Self-pay | Admitting: *Deleted

## 2014-03-19 ENCOUNTER — Ambulatory Visit: Payer: Medicare HMO | Admitting: Cardiovascular Disease

## 2014-03-19 NOTE — Telephone Encounter (Signed)
Left message on answer machine. 

## 2014-03-19 NOTE — Telephone Encounter (Signed)
-----   Message from Leonie Man, MD sent at 03/15/2014  6:55 AM EST ----- Labs R ok for cath ----- Message -----    From: Lab in [facility] Interface    Sent: 03/15/2014   2:17 AM      To: Leonie Man, MD

## 2014-03-20 ENCOUNTER — Other Ambulatory Visit: Payer: Self-pay | Admitting: *Deleted

## 2014-03-20 ENCOUNTER — Encounter (HOSPITAL_COMMUNITY)
Admission: RE | Disposition: A | Discharge status: Home Health | Payer: Self-pay | Source: Ambulatory Visit | Attending: Cardiothoracic Surgery

## 2014-03-20 ENCOUNTER — Encounter (HOSPITAL_COMMUNITY): Payer: Self-pay | Admitting: General Practice

## 2014-03-20 ENCOUNTER — Inpatient Hospital Stay (HOSPITAL_COMMUNITY): Payer: Medicare HMO

## 2014-03-20 ENCOUNTER — Inpatient Hospital Stay (HOSPITAL_COMMUNITY)
Admission: RE | Admit: 2014-03-20 | Discharge: 2014-03-27 | DRG: 234 | Disposition: A | Discharge status: Home Health | Payer: Medicare HMO | Source: Ambulatory Visit | Attending: Cardiothoracic Surgery | Admitting: Cardiothoracic Surgery

## 2014-03-20 ENCOUNTER — Ambulatory Visit (HOSPITAL_COMMUNITY): Payer: Medicare HMO

## 2014-03-20 DIAGNOSIS — Z9221 Personal history of antineoplastic chemotherapy: Secondary | ICD-10-CM | POA: Diagnosis not present

## 2014-03-20 DIAGNOSIS — Z955 Presence of coronary angioplasty implant and graft: Secondary | ICD-10-CM

## 2014-03-20 DIAGNOSIS — D62 Acute posthemorrhagic anemia: Secondary | ICD-10-CM | POA: Diagnosis not present

## 2014-03-20 DIAGNOSIS — E785 Hyperlipidemia, unspecified: Secondary | ICD-10-CM | POA: Diagnosis present

## 2014-03-20 DIAGNOSIS — I2510 Atherosclerotic heart disease of native coronary artery without angina pectoris: Secondary | ICD-10-CM

## 2014-03-20 DIAGNOSIS — T82857A Stenosis of cardiac prosthetic devices, implants and grafts, initial encounter: Secondary | ICD-10-CM | POA: Diagnosis present

## 2014-03-20 DIAGNOSIS — Z9861 Coronary angioplasty status: Secondary | ICD-10-CM

## 2014-03-20 DIAGNOSIS — I2582 Chronic total occlusion of coronary artery: Secondary | ICD-10-CM | POA: Diagnosis present

## 2014-03-20 DIAGNOSIS — R0602 Shortness of breath: Secondary | ICD-10-CM | POA: Diagnosis present

## 2014-03-20 DIAGNOSIS — K219 Gastro-esophageal reflux disease without esophagitis: Secondary | ICD-10-CM | POA: Diagnosis present

## 2014-03-20 DIAGNOSIS — Z923 Personal history of irradiation: Secondary | ICD-10-CM

## 2014-03-20 DIAGNOSIS — Z87891 Personal history of nicotine dependence: Secondary | ICD-10-CM

## 2014-03-20 DIAGNOSIS — R5383 Other fatigue: Secondary | ICD-10-CM

## 2014-03-20 DIAGNOSIS — Z951 Presence of aortocoronary bypass graft: Secondary | ICD-10-CM

## 2014-03-20 DIAGNOSIS — Z79899 Other long term (current) drug therapy: Secondary | ICD-10-CM

## 2014-03-20 DIAGNOSIS — D689 Coagulation defect, unspecified: Secondary | ICD-10-CM

## 2014-03-20 DIAGNOSIS — D6959 Other secondary thrombocytopenia: Secondary | ICD-10-CM | POA: Diagnosis present

## 2014-03-20 DIAGNOSIS — R9439 Abnormal result of other cardiovascular function study: Secondary | ICD-10-CM | POA: Diagnosis present

## 2014-03-20 DIAGNOSIS — I25110 Atherosclerotic heart disease of native coronary artery with unstable angina pectoris: Secondary | ICD-10-CM | POA: Diagnosis present

## 2014-03-20 DIAGNOSIS — I25119 Atherosclerotic heart disease of native coronary artery with unspecified angina pectoris: Secondary | ICD-10-CM | POA: Diagnosis present

## 2014-03-20 DIAGNOSIS — Z8546 Personal history of malignant neoplasm of prostate: Secondary | ICD-10-CM

## 2014-03-20 DIAGNOSIS — Z7982 Long term (current) use of aspirin: Secondary | ICD-10-CM | POA: Diagnosis not present

## 2014-03-20 DIAGNOSIS — I369 Nonrheumatic tricuspid valve disorder, unspecified: Secondary | ICD-10-CM

## 2014-03-20 DIAGNOSIS — Y712 Prosthetic and other implants, materials and accessory cardiovascular devices associated with adverse incidents: Secondary | ICD-10-CM | POA: Diagnosis present

## 2014-03-20 DIAGNOSIS — I209 Angina pectoris, unspecified: Secondary | ICD-10-CM | POA: Diagnosis present

## 2014-03-20 DIAGNOSIS — I10 Essential (primary) hypertension: Secondary | ICD-10-CM | POA: Diagnosis present

## 2014-03-20 HISTORY — PX: LEFT HEART CATHETERIZATION WITH CORONARY ANGIOGRAM: SHX5451

## 2014-03-20 HISTORY — DX: Reserved for inherently not codable concepts without codable children: IMO0001

## 2014-03-20 LAB — PULMONARY FUNCTION TEST
DL/VA % pred: 78 %
DL/VA: 3.62 ml/min/mmHg/L
DLCO cor % pred: 43 %
DLCO cor: 14.05 ml/min/mmHg
DLCO unc % pred: 43 %
DLCO unc: 14.05 ml/min/mmHg
FEF 25-75 Post: 1.25 L/sec
FEF 25-75 Pre: 1.34 L/sec
FEF2575-%Change-Post: -6 %
FEF2575-%Pred-Post: 57 %
FEF2575-%Pred-Pre: 61 %
FEV1-%Change-Post: -1 %
FEV1-%Pred-Post: 59 %
FEV1-%Pred-Pre: 59 %
FEV1-Post: 1.79 L
FEV1-Pre: 1.82 L
FEV1FVC-%Change-Post: 3 %
FEV1FVC-%Pred-Pre: 104 %
FEV6-%Change-Post: -3 %
FEV6-%Pred-Post: 58 %
FEV6-%Pred-Pre: 61 %
FEV6-Post: 2.31 L
FEV6-Pre: 2.4 L
FEV6FVC-%Change-Post: 0 %
FEV6FVC-%Pred-Post: 106 %
FEV6FVC-%Pred-Pre: 105 %
FVC-%Change-Post: -4 %
FVC-%Pred-Post: 55 %
FVC-%Pred-Pre: 57 %
FVC-Post: 2.31 L
FVC-Pre: 2.42 L
Post FEV1/FVC ratio: 78 %
Post FEV6/FVC ratio: 100 %
Pre FEV1/FVC ratio: 75 %
Pre FEV6/FVC Ratio: 99 %
RV % pred: 111 %
RV: 2.86 L
TLC % pred: 74 %
TLC: 5.25 L

## 2014-03-20 LAB — BLOOD GAS, ARTERIAL
Acid-base deficit: 1.8 mmol/L (ref 0.0–2.0)
Bicarbonate: 21.4 mEq/L (ref 20.0–24.0)
Drawn by: 20517
FIO2: 0.21 %
O2 Saturation: 94.7 %
Patient temperature: 98.6
TCO2: 22.3 mmol/L (ref 0–100)
pCO2 arterial: 29.8 mmHg — ABNORMAL LOW (ref 35.0–45.0)
pH, Arterial: 7.469 — ABNORMAL HIGH (ref 7.350–7.450)
pO2, Arterial: 70.9 mmHg — ABNORMAL LOW (ref 80.0–100.0)

## 2014-03-20 LAB — CBC
HCT: 37.4 % — ABNORMAL LOW (ref 39.0–52.0)
Hemoglobin: 12.5 g/dL — ABNORMAL LOW (ref 13.0–17.0)
MCH: 31.3 pg (ref 26.0–34.0)
MCHC: 33.4 g/dL (ref 30.0–36.0)
MCV: 93.7 fL (ref 78.0–100.0)
Platelets: 102 10*3/uL — ABNORMAL LOW (ref 150–400)
RBC: 3.99 MIL/uL — AB (ref 4.22–5.81)
RDW: 14.4 % (ref 11.5–15.5)
WBC: 4.6 10*3/uL (ref 4.0–10.5)

## 2014-03-20 LAB — COMPREHENSIVE METABOLIC PANEL
ALT: 34 U/L (ref 0–53)
AST: 29 U/L (ref 0–37)
Albumin: 3.4 g/dL — ABNORMAL LOW (ref 3.5–5.2)
Alkaline Phosphatase: 65 U/L (ref 39–117)
Anion gap: 15 (ref 5–15)
BUN: 25 mg/dL — ABNORMAL HIGH (ref 6–23)
CO2: 21 mEq/L (ref 19–32)
Calcium: 9 mg/dL (ref 8.4–10.5)
Chloride: 105 mEq/L (ref 96–112)
Creatinine, Ser: 1.05 mg/dL (ref 0.50–1.35)
GFR calc Af Amer: 78 mL/min — ABNORMAL LOW (ref >90)
GFR calc non Af Amer: 67 mL/min — ABNORMAL LOW (ref >90)
Glucose, Bld: 107 mg/dL — ABNORMAL HIGH (ref 70–99)
Potassium: 3.6 mEq/L — ABNORMAL LOW (ref 3.7–5.3)
Sodium: 141 mEq/L (ref 137–147)
Total Bilirubin: 0.5 mg/dL (ref 0.3–1.2)
Total Protein: 6.3 g/dL (ref 6.0–8.3)

## 2014-03-20 LAB — URINALYSIS, ROUTINE W REFLEX MICROSCOPIC
Bilirubin Urine: NEGATIVE
Glucose, UA: NEGATIVE mg/dL
Hgb urine dipstick: NEGATIVE
Ketones, ur: NEGATIVE mg/dL
Leukocytes, UA: NEGATIVE
Nitrite: NEGATIVE
Protein, ur: NEGATIVE mg/dL
Specific Gravity, Urine: 1.029 (ref 1.005–1.030)
Urobilinogen, UA: 0.2 mg/dL (ref 0.0–1.0)
pH: 6.5 (ref 5.0–8.0)

## 2014-03-20 LAB — PROTIME-INR
INR: 1.01 (ref 0.00–1.49)
Prothrombin Time: 13.4 seconds (ref 11.6–15.2)

## 2014-03-20 LAB — ABO/RH: ABO/RH(D): O POS

## 2014-03-20 LAB — SURGICAL PCR SCREEN
MRSA, PCR: NEGATIVE
Staphylococcus aureus: POSITIVE — AB

## 2014-03-20 LAB — APTT: aPTT: 22 seconds — ABNORMAL LOW (ref 24–37)

## 2014-03-20 SURGERY — LEFT HEART CATHETERIZATION WITH CORONARY ANGIOGRAM
Anesthesia: LOCAL

## 2014-03-20 MED ORDER — NITROGLYCERIN 0.4 MG SL SUBL: 0.4000 mg | SUBLINGUAL_TABLET | SUBLINGUAL | Status: DC | PRN

## 2014-03-20 MED ORDER — SODIUM CHLORIDE 0.9 % IV SOLN
1.0000 mL/kg/h | INTRAVENOUS | Status: DC
  Administered 2014-03-20: 1 mL/kg/h via INTRAVENOUS

## 2014-03-20 MED ORDER — TEMAZEPAM 15 MG PO CAPS
15.0000 mg | ORAL_CAPSULE | Freq: Once | ORAL | Status: AC | PRN
  Administered 2014-03-20: 15 mg via ORAL
  Filled 2014-03-20: qty 1

## 2014-03-20 MED ORDER — ONDANSETRON HCL 4 MG/2ML IJ SOLN: 4.0000 mg | Freq: Four times a day (QID) | INTRAMUSCULAR | Status: DC | PRN

## 2014-03-20 MED ORDER — LORAZEPAM 0.5 MG PO TABS: 0.5000 mg | ORAL_TABLET | ORAL | Status: DC | PRN

## 2014-03-20 MED ORDER — LORAZEPAM 2 MG/ML IJ SOLN: 1.0000 mg | Freq: Four times a day (QID) | INTRAMUSCULAR | Status: AC | PRN

## 2014-03-20 MED ORDER — DEXTROSE 5 % IV SOLN
1.5000 g | INTRAVENOUS | Status: AC
  Administered 2014-03-21: .75 g via INTRAVENOUS
  Administered 2014-03-21: 1.5 g via INTRAVENOUS
  Filled 2014-03-20: qty 1.5

## 2014-03-20 MED ORDER — SODIUM CHLORIDE 0.9 % IJ SOLN: 3.0000 mL | Freq: Two times a day (BID) | INTRAMUSCULAR | Status: DC

## 2014-03-20 MED ORDER — ACETAMINOPHEN 325 MG PO TABS: 650.0000 mg | ORAL_TABLET | ORAL | Status: DC | PRN

## 2014-03-20 MED ORDER — SODIUM CHLORIDE 0.9 % IV SOLN
INTRAVENOUS | Status: DC
  Administered 2014-03-20: 07:00:00 via INTRAVENOUS

## 2014-03-20 MED ORDER — FENTANYL CITRATE 0.05 MG/ML IJ SOLN
INTRAMUSCULAR | Status: AC
  Filled 2014-03-20: qty 2

## 2014-03-20 MED ORDER — PLASMA-LYTE 148 IV SOLN
INTRAVENOUS | Status: AC
  Administered 2014-03-21: 10:00:00
  Filled 2014-03-20: qty 2.5

## 2014-03-20 MED ORDER — MIDAZOLAM HCL 2 MG/2ML IJ SOLN
INTRAMUSCULAR | Status: AC
  Filled 2014-03-20: qty 2

## 2014-03-20 MED ORDER — DEXTROSE 5 % IV SOLN
750.0000 mg | INTRAVENOUS | Status: DC
  Filled 2014-03-20 (×2): qty 750

## 2014-03-20 MED ORDER — VITAMIN B-1 100 MG PO TABS
100.0000 mg | ORAL_TABLET | Freq: Every day | ORAL | Status: DC
  Administered 2014-03-22 – 2014-03-27 (×6): 100 mg via ORAL
  Filled 2014-03-20 (×9): qty 1

## 2014-03-20 MED ORDER — THIAMINE HCL 100 MG/ML IJ SOLN
100.0000 mg | Freq: Every day | INTRAMUSCULAR | Status: DC
  Administered 2014-03-20: 100 mg via INTRAVENOUS
  Filled 2014-03-20 (×5): qty 1

## 2014-03-20 MED ORDER — METOPROLOL SUCCINATE ER 25 MG PO TB24
25.0000 mg | ORAL_TABLET | Freq: Every day | ORAL | Status: DC
  Administered 2014-03-20: 25 mg via ORAL
  Filled 2014-03-20 (×2): qty 1

## 2014-03-20 MED ORDER — NITROGLYCERIN 1 MG/10 ML FOR IR/CATH LAB
INTRA_ARTERIAL | Status: AC
  Filled 2014-03-20: qty 10

## 2014-03-20 MED ORDER — FOLIC ACID 1 MG PO TABS
1.0000 mg | ORAL_TABLET | Freq: Every day | ORAL | Status: DC
  Administered 2014-03-20 – 2014-03-26 (×6): 1 mg via ORAL
  Filled 2014-03-20 (×8): qty 1

## 2014-03-20 MED ORDER — MORPHINE SULFATE 2 MG/ML IJ SOLN
2.0000 mg | INTRAMUSCULAR | Status: DC | PRN
Start: 2014-03-20 — End: 2014-03-21

## 2014-03-20 MED ORDER — PHENYLEPHRINE HCL 10 MG/ML IJ SOLN
30.0000 ug/min | INTRAVENOUS | Status: AC
  Administered 2014-03-21: 20 ug/min via INTRAVENOUS
  Filled 2014-03-20: qty 2

## 2014-03-20 MED ORDER — CHLORHEXIDINE GLUCONATE 4 % EX LIQD
60.0000 mL | Freq: Once | CUTANEOUS | Status: AC
  Administered 2014-03-20: 4 via TOPICAL
  Filled 2014-03-20: qty 60

## 2014-03-20 MED ORDER — SODIUM CHLORIDE 0.9 % IV SOLN
INTRAVENOUS | Status: DC
  Filled 2014-03-20: qty 30

## 2014-03-20 MED ORDER — MAGNESIUM SULFATE 50 % IJ SOLN
40.0000 meq | INTRAMUSCULAR | Status: DC
  Filled 2014-03-20: qty 10

## 2014-03-20 MED ORDER — AMLODIPINE BESYLATE 5 MG PO TABS
5.0000 mg | ORAL_TABLET | Freq: Every day | ORAL | Status: DC
  Administered 2014-03-20: 5 mg via ORAL
  Filled 2014-03-20 (×2): qty 1

## 2014-03-20 MED ORDER — ADULT MULTIVITAMIN W/MINERALS CH
1.0000 | ORAL_TABLET | Freq: Every day | ORAL | Status: DC
  Administered 2014-03-20 – 2014-03-27 (×7): 1 via ORAL
  Filled 2014-03-20 (×9): qty 1

## 2014-03-20 MED ORDER — NITROGLYCERIN IN D5W 200-5 MCG/ML-% IV SOLN
2.0000 ug/min | INTRAVENOUS | Status: AC
  Administered 2014-03-21 (×2): 5 ug/min via INTRAVENOUS
  Filled 2014-03-20: qty 250

## 2014-03-20 MED ORDER — ALBUTEROL SULFATE (2.5 MG/3ML) 0.083% IN NEBU
2.5000 mg | INHALATION_SOLUTION | Freq: Once | RESPIRATORY_TRACT | Status: AC
  Administered 2014-03-20: 2.5 mg via RESPIRATORY_TRACT

## 2014-03-20 MED ORDER — BISACODYL 5 MG PO TBEC
5.0000 mg | DELAYED_RELEASE_TABLET | Freq: Once | ORAL | Status: AC
  Administered 2014-03-20: 5 mg via ORAL
  Filled 2014-03-20: qty 1

## 2014-03-20 MED ORDER — CHLORHEXIDINE GLUCONATE CLOTH 2 % EX PADS: 6.0000 | MEDICATED_PAD | Freq: Every day | CUTANEOUS | Status: DC

## 2014-03-20 MED ORDER — METOPROLOL TARTRATE 12.5 MG HALF TABLET
12.5000 mg | ORAL_TABLET | Freq: Once | ORAL | Status: AC
  Administered 2014-03-21: 12.5 mg via ORAL
  Filled 2014-03-20: qty 1

## 2014-03-20 MED ORDER — DOPAMINE-DEXTROSE 3.2-5 MG/ML-% IV SOLN
0.0000 ug/kg/min | INTRAVENOUS | Status: DC
  Filled 2014-03-20 (×2): qty 250

## 2014-03-20 MED ORDER — ALPRAZOLAM 2 MG PO TABS: 2.0000 mg | ORAL_TABLET | Freq: Every day | ORAL | Status: DC

## 2014-03-20 MED ORDER — VANCOMYCIN HCL 10 G IV SOLR
1250.0000 mg | INTRAVENOUS | Status: AC
  Administered 2014-03-21: 1250 mg via INTRAVENOUS
  Filled 2014-03-20: qty 1250

## 2014-03-20 MED ORDER — CHLORHEXIDINE GLUCONATE 4 % EX LIQD
60.0000 mL | Freq: Once | CUTANEOUS | Status: AC
  Administered 2014-03-21: 4 via TOPICAL
  Filled 2014-03-20 (×2): qty 60

## 2014-03-20 MED ORDER — MUPIROCIN 2 % EX OINT
1.0000 "application " | TOPICAL_OINTMENT | Freq: Two times a day (BID) | CUTANEOUS | Status: AC
  Administered 2014-03-20 – 2014-03-25 (×8): 1 via NASAL
  Filled 2014-03-20: qty 22

## 2014-03-20 MED ORDER — LORAZEPAM 1 MG PO TABS: 1.0000 mg | ORAL_TABLET | Freq: Four times a day (QID) | ORAL | Status: AC | PRN

## 2014-03-20 MED ORDER — SODIUM CHLORIDE 0.9 % IV SOLN
INTRAVENOUS | Status: AC
  Administered 2014-03-21: 1 [IU]/h via INTRAVENOUS
  Filled 2014-03-20: qty 2.5

## 2014-03-20 MED ORDER — DEXMEDETOMIDINE HCL IN NACL 400 MCG/100ML IV SOLN
0.1000 ug/kg/h | INTRAVENOUS | Status: AC
  Administered 2014-03-21: 0.2 ug/kg/h via INTRAVENOUS
  Filled 2014-03-20: qty 100

## 2014-03-20 MED ORDER — HEPARIN (PORCINE) IN NACL 2-0.9 UNIT/ML-% IJ SOLN
INTRAMUSCULAR | Status: AC
  Filled 2014-03-20: qty 1500

## 2014-03-20 MED ORDER — VERAPAMIL HCL 2.5 MG/ML IV SOLN
INTRAVENOUS | Status: AC
  Filled 2014-03-20: qty 2

## 2014-03-20 MED ORDER — POTASSIUM CHLORIDE 2 MEQ/ML IV SOLN
80.0000 meq | INTRAVENOUS | Status: DC
  Filled 2014-03-20: qty 40

## 2014-03-20 MED ORDER — LIDOCAINE HCL (PF) 1 % IJ SOLN
INTRAMUSCULAR | Status: AC
  Filled 2014-03-20: qty 30

## 2014-03-20 MED ORDER — SODIUM CHLORIDE 0.9 % IV SOLN
INTRAVENOUS | Status: AC
  Administered 2014-03-21: 69.8 mL/h via INTRAVENOUS
  Filled 2014-03-20: qty 40

## 2014-03-20 MED ORDER — EPINEPHRINE HCL 1 MG/ML IJ SOLN
0.0000 ug/min | INTRAMUSCULAR | Status: DC
  Filled 2014-03-20: qty 4

## 2014-03-20 MED ORDER — HEPARIN SODIUM (PORCINE) 1000 UNIT/ML IJ SOLN
INTRAMUSCULAR | Status: AC
  Filled 2014-03-20: qty 1

## 2014-03-20 MED ORDER — SODIUM CHLORIDE 0.9 % IJ SOLN: 3.0000 mL | INTRAMUSCULAR | Status: DC | PRN

## 2014-03-20 NOTE — Progress Notes (Signed)
  Echocardiogram 2D Echocardiogram has been performed.  Robert Boyle 03/20/2014, 3:26 PM

## 2014-03-20 NOTE — H&P (View-Only) (Signed)
PCP: Chesley Noon, MD  Clinic Note: Chief Complaint  Patient presents with  . Follow-up    02/28/14 Abnormal Stress Test  . Shortness of Breath   HPI: Robert Boyle is a 78 y.o. male with a PMH below who presents today for for worsening dyspnea. He is a patient of Dr. Aldona Bar, is seeing Dr. Sallyanne Kuster 1 time since Dr. Eddie Dibbles retirement on 12/13/2012. The patient has a history of coronary disease status post bare-metal stent placed to D1 8 2000 in the setting of an abnormal stress test. Again repeat  By catheterization one year later and had in-stent restenosis treated with PTCA with a 2.5 mm balloon.  Since then he has not had any significant anginal symptoms. He has been undergoing radiation and chemotherapy for prostate cancer the last year and subsequently not been seen yet this year by Dr. Sallyanne Kuster. May through July.    Over the past month or so he has noted progressively worsening exertional dyspnea most notable when he goes up a flight of steps or up a hill. He has got progressively worse to the point where now walking in from the waiting room to the clinical room made him profoundly dyspneic. This past weekend he was walking back to his car from a sporting event and had 2 walk up a slight hill, but was unable to do so and had a car drive down to get him. At that particular time, he noticed a little discomfort in his chest but for the most part of his dyspnea.  He is not wanting to play golf in the last 2 months because of dyspnea. He denies any PND orthopnea, or any significant edema. He was eating dinner last month with Dr. Rex Kras, who noted dyspnea while eating his meal. Based on Dr. Eddie Dibbles recommendations, he went to see his PCP Dr. Melford Aase who recommended that he set up appointment to see Dr. Sallyanne Kuster for dyspnea on exertion. The recommendation was made that he go ahead and have a stress test done prior to seeing Dr. Sallyanne Kuster.  Stress test was performed on November 11 and  was read as INTERMEDIATE RISK with new ischemia in the inferior-inferolateral region. This was read as being a small in size moderate intensity region, however on my evaluation appeared in more moderate to large in size with moderate intensity partially reversible defect.  Otherwise cardiac standpoint he denies any rapid irregular heartbeat/palpitations, lightheadedness, dizziness, CT/near syncope or TIA/amaurosis fugax symptoms. The white count he feels a little lightheaded when he gets profoundly dyspneic. No melena, hematochezia, epistaxis. He has not had any hematuria in the past month.    Past Medical History  Diagnosis Date  . Essential hypertension   . Hyperlipidemia with target LDL less than 70   . CAD S/P percutaneous coronary angioplasty 2000; 2001    a) BMS to D1 75% stenosis (~2.5 mm x 18 mm AVE BMS), mid LAD ~40-50%, mRCA 30-40%, small Cx;; b) 75% ISR of D1 stent - PTCA with 2.5 mm x 15 mm balloon;; c) Myoview 7/'13: EF 63% is inferoseptal gut attenuation artifact but no reversible ischemia  . Gastrointestinal complaints   . Prostate cancer 05/23/13    gleason 4+3=7, vloume 34.32 cc; s/p Chemo-XRT  . History of radiation therapy 09/06/13- 11/02/13    prostate 7600 cGy 40 sessions, seminal vesicles 5600 cGy 40 sessions  . Gout   . Arthritis   . GERD (gastroesophageal reflux disease)   . H/O renal calculi  Prior Cardiac Evaluation and Past Surgical History: Past Surgical History  Procedure Laterality Date  . Coronary angioplasty with stent placement  10/17/1998    PCI with ~2.5 mm x 18 mm AVE BMS to D1  . Coronary angioplasty  07/21/1999    PTCA to ISR stenosis to LAD  . Nm myocar perf wall motion  11/06/2011    low risk 63% ; FIXED INFEROSEPTAL GUT ATTENUATION  . Prostate biopsy  05/23/13    gleason 4+3=7, vol 34.32 cc  . Cholecystectomy  01/22/10  . Back surgery  1995    lumbar    ROS: A comprehensive was performed. Review of Systems  Constitutional: Negative for weight  loss and malaise/fatigue.  HENT: Negative for nosebleeds.   Eyes: Negative for blurred vision and double vision.  Respiratory: Positive for shortness of breath. Negative for cough and wheezing.   Cardiovascular: Negative for palpitations, claudication and leg swelling.  Gastrointestinal: Negative for blood in stool and melena.  Genitourinary: Negative for hematuria.  Musculoskeletal: Positive for joint pain.       Knees & hip; No recent Gout flare-up  Neurological: Positive for dizziness. Negative for sensory change, speech change, focal weakness, seizures, loss of consciousness and headaches.  Endo/Heme/Allergies: Does not bruise/bleed easily.  Psychiatric/Behavioral: Negative for depression and memory loss. The patient is not nervous/anxious.   All other systems reviewed and are negative.   Current Outpatient Prescriptions on File Prior to Visit  Medication Sig Dispense Refill  . allopurinol (ZYLOPRIM) 300 MG tablet Take 300 mg by mouth daily.    Marland Kitchen amLODipine (NORVASC) 5 MG tablet Take 5 mg by mouth daily.    Marland Kitchen aspirin 325 MG EC tablet Take 325 mg by mouth daily.    Marland Kitchen atorvastatin (LIPITOR) 80 MG tablet Take 80 mg by mouth daily.    . celecoxib (CELEBREX) 200 MG capsule Take 200 mg by mouth daily.    . furosemide (LASIX) 20 MG tablet Take 20 mg by mouth daily.     No current facility-administered medications on file prior to visit.   ALLERGIES REVIEWED IN EPIC -- No change  History   Social History Narrative   Retired.    Divorced, but lives with his ex-wife.   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 No tobacco products.   Plays golf with his friends and sons.   Family History  Problem Relation Age of Onset  . Heart attack Father   . Stroke Mother   . Heart attack Brother   . Cancer Brother     one brother-prostate    Wt Readings from Last 3 Encounters:  03/12/14 211 lb 11.2 oz (96.026 kg)  02/28/14 203 lb (92.08 kg)  12/06/13 20 lb 6.4 oz (9.253 kg)    PHYSICAL  EXAM BP 152/76 mmHg  Pulse 94  Ht 6\' 1"  (1.854 m)  Wt 211 lb 11.2 oz (96.026 kg)  BMI 27.94 kg/m2 General appearance: alert, cooperative, appears stated age, no distress and borderline obese Neck: no adenopathy, no carotid bruit and no JVD Lungs: clear to auscultation bilaterally, normal percussion bilaterally and non-labored Heart: regular rate and rhythm, S1, S2 normal, no murmur, click, rub or gallop; nondisplaced PMI Abdomen: soft, non-tender; bowel sounds normal; no masses,  no organomegaly; mild truncal obesity Extremities: extremities normal, atraumatic, no cyanosis,or edema Pulses: 2+ and symmetric; Skin: mobility and turgor normal Neurologic: Mental status: Alert, oriented, thought content appropriate Cranial nerves: normal (II-XII grossly intact)    Adult ECG Report - not performed  because of recent stress test  Recent Labs:      Chemistry      Component Value Date/Time   NA 136 01/22/2010 1351   K 3.8 01/22/2010 1351   CL 100 01/22/2010 1351   CO2 29 01/22/2010 1351   BUN 15 01/22/2010 1351   CREATININE 1.19 01/22/2010 1351      Component Value Date/Time   CALCIUM 9.2 01/22/2010 1351   ALKPHOS 95 01/22/2010 1351   AST 36 01/22/2010 1351   ALT 36 01/22/2010 1351   BILITOT 1.1 01/22/2010 1351     No results found for: CHOL, HDL, LDLCALC, LDLDIRECT, TRIG, CHOLHDL  ASSESSMENT / PLAN: Angina, class IV I'm concerned, but it is progressively worsening exertional dyspnea is consistent with class IV anginal symptoms. Simply getting short of breath walking into the clinic room with an abnormal Myoview stress test is very concerning for possible RCA distribution ischemia. He did have some RCA disease in 2001 as well as some LAD disease.  Plan:  Add Imdur 30 mg daily with when necessary nitroglycerin  Add metoprolol succinate 25 mg daily  Plan Left Heart Catheterization Plus Minus PCI on December 1 - right radial, by me  Performing MD:  Leonie Man, M.D.,  M.S.  Procedure:  Left Heart Catheterization With Coronary Angiography +/-  PCI  The procedure with Risks/Benefits/Alternatives and Indications was reviewed with the patient & family.  All questions were answered.    Risks / Complications include, but not limited to: Death, MI, CVA/TIA, VF/VT (with defibrillation), Bradycardia (need for temporary pacer placement), contrast induced nephropathy, bleeding / bruising / hematoma / pseudoaneurysm, vascular or coronary injury (with possible emergent CT or Vascular Surgery), adverse medication reactions, infection.  Additional risks involving the use of radiation with the possibility of radiation burns and cancer were explained in detail.  The patient and family voice understanding and agree to proceed.     CAD STATUS post bare-metal stent to diagonal 2000 ([id number] 2.5x18), treated for in stent restenosis 2001 He doesn't remember the symptoms he had back in 2000 and 2001. He certainly is having profound dyspnea and fatigue that is concerning for class IV angina. At the same time he is on high dose Lipitor as well as full dose aspirin and amlodipine. He also takes a single dose of Lasix. For medical optimization I may add beta blockers and long-acting nitrate & PRN NTG.  Essential hypertension Elevated blood pressure today was 152/76, he also heart rate 94 simply walking in from the waiting area. He has not yet room to add beta blocker to his calcium channel blocker.  Plan: Add Toprol 25 mg daily plus Imdur 30 mg daily  Hyperlipidemia with target LDL less than 70 Routinely checked by PCP. I don't relatively recent results. He is on high-dose Lipitor and tolerating it well.  Other fatigue In addition to being dyspneic, he is also having worsening fatigue. Will check TSH along with his precath labs.  Weight gain Partially because of inactivity related to his recent prolonged chemotherapy and radiation course, he has gained weight, total of 12 pounds  since last August.  This may be contributing to his exertional dyspnea and fatigue, but has not been able to be as active as usual now because of his dyspnea. Hopefully when this is addressed he will be on being more active exercise or lose weight. We talked about dietary modifications as well.    Orders Placed This Encounter  Procedures  . Basic metabolic panel  .  CBC  . Protime-INR  . APTT  . TSH  . LEFT HEART CATHETERIZATION WITH CORONARY ANGIOGRAM    Followup: 2-3 weeks post Cath -- can be with APP or me.  The patient has requested to simply switch over to having his cardiologist, since I will be performing the catheterization.  I will confer with Dr. Sallyanne Kuster.   Leonie Man, M.D., M.S. Interventional Cardiologist   Pager # [phone]

## 2014-03-20 NOTE — Plan of Care (Signed)
Problem: Phase I - Pre-Op Goal: Pre-Op Education completed Outcome: Completed/Met Date Met:  03/20/14 Pt. Refuses video

## 2014-03-20 NOTE — Plan of Care (Signed)
Problem: Phase I - Pre-Op Goal: Pain controlled with appropriate interventions Outcome: Completed/Met Date Met:  03/20/14

## 2014-03-20 NOTE — Consult Note (Signed)
[address]       [place] [address]             [phone]        Robert Boyle [facility] Medical Record #[id number] Date of Birth: Jun 23, 1935  Referring: Dr. Ellyn Hack Primary Care: Chesley Noon, MD  Chief Complaint:    Worsening shortness of breath  History of Present Illness:     This is a 78 year old Caucasian male with cardiac risk factors that include history of CAD (that includes PCI with BMS to Diagonal 1 in 2000, ISR of Diagonal 1), hyperlipidemia, hypertension, and remote tobacco abuse who presented with complaints of  worsening shortness of breath. He is a former patient and neighbor of Dr. Aldona Bar, who has seen Dr. Sallyanne Kuster once since Dr. Eddie Dibbles retirement on 12/13/2012. Repeat catheterization one year later (after BMS stent placed) showed an in-stent restenosis treated. This was treated with PTCA with a 2.5 mm balloon. Since then, he has not had any significant anginal symptoms. He finished undergoing radiation treatment this past July for prostate cancer and subsequently, has not been seen yet this year by Dr. Sallyanne Kuster.   Over the past month or so, he has noted progressively worsening exertional dyspnea. This is most notable when he goes up a flight of steps or up a hill. His exertional shortness of breath has worsened to the point where now walking in from the waiting room to the clinical room made him profoundly dyspneic. This past weekend, he was walking back to his car from a sporting event and had to walk up a slight hill. He  was unable to do so and had a car drive down to get him. At that particular time, he noticed a little discomfort (not chest pain) in his chest but for the most part, his primary symtom is exertional dyspnea. He is not wanting to play golf in the last 2 months because of the dyspnea. He denies any PND, orthopnea,or any significant edema. He was eating dinner last month with Dr. Rex Kras, who noted dyspnea while  eating his meal. Based on Dr. Eddie Dibbles recommendations, he went to see his PCP Dr. Melford Aase who recommended that he set up appointment to see Dr. Sallyanne Kuster for dyspnea on exertion. The recommendation was made that he go ahead and have a stress test done prior to seeing Dr. Sallyanne Kuster. Stress test was performed on November 11th and was read as INTERMEDIATE RISK with new ischemia in the inferior-inferolateral region. This was read as being a small in size moderate intensity region;however, on Dr. Allison Quarry evaluation, the risk appeared in more moderate to large in size with moderate intensity partially reversible defect. He denies any irregular heartbeat,palpitations, lightheadedness, dizziness, syncope,near syncope, TIA, or amaurosis fugax symptoms.  He underwent a cardiac catheterization by Dr. Ellyn Hack today and was found to have multivessel coronary artery disease (please see cath results below) with a preserved LVEF. Cardiac Surgery was consulted for the consideration of coronary artery bypass grafting surgery. Currently, the patient has no shortness of breath or chest pain. Vital signs are stable.  Current Activity/ Functional Status: Patient is independent with mobility/ambulation, transfers, ADL's, IADL's.   Zubrod Score: At the time of surgery this patient's most appropriate activity status/level should be described as: []     0    Normal activity, no symptoms [x]     1    Restricted in physical strenuous activity but ambulatory, able to do out light work []   2    Ambulatory and capable of self care, unable to do work activities, up and about                 more than 50%  Of the time                            []     3    Only limited self care, in bed greater than 50% of waking hours []     4    Completely disabled, no self care, confined to bed or chair []     5    Moribund  Past Medical History  Diagnosis Date  . Essential hypertension   . Hyperlipidemia with target LDL less than 70   . CAD  S/P percutaneous coronary angioplasty 2000; 2001    a) BMS to D1 75% stenosis (~2.5 mm x 18 mm AVE BMS), mid LAD ~40-50%, mRCA 30-40%, small Cx;; b) 75% ISR of D1 stent - PTCA with 2.5 mm x 15 mm balloon;; c) Myoview 7/'13: EF 63% is inferoseptal gut attenuation artifact but no reversible ischemia  . Gastrointestinal complaints   . Prostate cancer 05/23/13    gleason 4+3=7, vloume 34.32 cc; s/p Chemo-XRT  . History of radiation therapy 09/06/13- 11/02/13    prostate 7600 cGy 40 sessions, seminal vesicles 5600 cGy 40 sessions  . Gout   . Arthritis   . GERD (gastroesophageal reflux disease)   . H/O renal calculi     Past Surgical History  Procedure Laterality Date  . Coronary angioplasty with stent placement  10/17/1998    PCI with ~2.5 mm x 18 mm AVE BMS to D1  . Coronary angioplasty  07/21/1999    PTCA to ISR stenosis to LAD  . Nm myocar perf wall motion  11/06/2011    low risk 63% ; FIXED INFEROSEPTAL GUT ATTENUATION  . Prostate biopsy  05/23/13    gleason 4+3=7, vol 34.32 cc  . Cholecystectomy  01/22/10  . Back surgery  1995    lumbar  Appendectomy  History  Smoking status  . Former Smoker -- 2.00 packs/day for 25 years  . Quit date: 04/20/1983    History  Alcohol Use  . Yes    Comment: 3 a day    History   Social History  . Marital Status: Single    Spouse Name: N/A    Number of Children: N/A  . Years of Education: N/A   Occupational History  . Retired   Social History Main Topics  . Smoking status: Former Smoker -- 2.00 packs/day for 25 years    Quit date: 04/20/1983  . Smokeless tobacco: Not on file  . Alcohol Use: Yes     Comment: 3 Scotch per day  . Drug Use: No  . Sexual Activity: Not on file    Social History Narrative   Retired.    Divorced, but lives with his ex-wife.   Plays golf with his friends and sons.   Allergies: No Known Drug Allergies  Current Facility-Administered Medications  Medication Dose Route Frequency Provider Last Rate Last Dose    . 0.9 %  sodium chloride infusion  1 mL/kg/hr Intravenous Continuous Leonie Man, MD 93.9 mL/hr at 03/20/14 0855 1 mL/kg/hr at 03/20/14 0855  . acetaminophen (TYLENOL) tablet 650 mg  650 mg Oral Q4H PRN Leonie Man, MD      . amLODipine (NORVASC) tablet 5 mg  5 mg Oral Daily Leonie Man, MD      . folic acid (FOLVITE) tablet 1 mg  1 mg Oral Daily Brett Canales, PA-C      . LORazepam (ATIVAN) tablet 1 mg  1 mg Oral Q6H PRN Brett Canales, PA-C       Or  . LORazepam (ATIVAN) injection 1 mg  1 mg Intravenous Q6H PRN Brett Canales, PA-C      . metoprolol succinate (TOPROL-XL) 24 hr tablet 25 mg  25 mg Oral Daily Leonie Man, MD      . morphine 2 MG/ML injection 2 mg  2 mg Intravenous Q1H PRN Leonie Man, MD      . multivitamin with minerals tablet 1 tablet  1 tablet Oral Daily Brett Canales, PA-C      . nitroGLYCERIN (NITROSTAT) SL tablet 0.4 mg  0.4 mg Sublingual Q5 min PRN Leonie Man, MD      . ondansetron [facility]) injection 4 mg  4 mg Intravenous Q6H PRN Leonie Man, MD      . thiamine (VITAMIN B-1) tablet 100 mg  100 mg Oral Daily Brett Canales, PA-C       Or  . thiamine (B-1) injection 100 mg  100 mg Intravenous Daily Brett Canales, PA-C        Prescriptions prior to admission  Medication Sig Dispense Refill Last Dose  . allopurinol (ZYLOPRIM) 300 MG tablet Take 300 mg by mouth daily.   03/19/2014  . amLODipine (NORVASC) 5 MG tablet Take 5 mg by mouth daily.   03/19/2014  . aspirin 325 MG EC tablet Take 325 mg by mouth daily.   03/19/2014  . atorvastatin (LIPITOR) 80 MG tablet Take 80 mg by mouth daily.   03/19/2014  . celecoxib (CELEBREX) 200 MG capsule Take 200 mg by mouth daily.   03/19/2014  . furosemide (LASIX) 20 MG tablet Take 20 mg by mouth daily.   03/19/2014  . isosorbide mononitrate (IMDUR) 30 MG 24 hr tablet Take 1 tablet (30 mg total) by mouth daily. 30 tablet 6 03/19/2014  . metoprolol succinate (TOPROL XL) 25 MG 24 hr tablet Take 1 tablet (25 mg  total) by mouth daily. 30 tablet 6 03/19/2014  . nitroGLYCERIN (NITROSTAT) 0.4 MG SL tablet Place 1 tablet (0.4 mg total) under the tongue every 5 (five) minutes as needed for chest pain. 25 tablet 4     Family History  Problem Relation Age of Onset  . Heart attack Father   . Stroke Mother   . Heart attack Brother   . Cancer Brother     one brother-prostate     Review of Systems:     Cardiac Review of Systems: Y or N  Chest Pain [ N   ]  Resting SOB [ N  ] Exertional SOB  [ Y ]  Orthopnea Aqua.Slicker  ]   Pedal Edema [  N ]    Palpitations [ N ]Syncope  [ N ]   Presyncope [ N  ]  General Review of Systems: [Y] = yes [  ]=no Constitional: recent weight change-GAIN [ Y ]; anorexia [ N ]; fatigue [  ]; nausea [  N]; night sweats [ N ]; fever Aqua.Slicker  ]; or chills [ N ]  Dental:dentures [ Y ]   Eye : blurred vision [ N ]; diplopia [ N  ]; vision                        changes Aqua.Slicker  ];  Amaurosis fugax[ N ]; Resp: cough Aqua.Slicker  ];  wheezing[ N ];  hemoptysis[ N ];   GI:  gallstones[N  ], vomiting[ N ];  dysphagia[ N ]; melena[ N ];  hematochezia Aqua.Slicker  ]; heartburn[Y  ] GU: kidney stones [  ]; hematuria[ N ];   dysuria [ N ];  nocturia[ N ];  Urinary frequency Aqua.Slicker  ]             Skin: rash, swelling[ N ];, hair loss[ N ]; itching[ N ]; Musculosketetal: myalgias[N  ];  joint swelling[N  ];  joint erythema[ N ]; joint pain[Y  ];  back pain[ Y ];  Heme/Lymph: bruising[ N ];  bleeding[N  ]; anemia[ N ];  Neuro: TIA[ N ];  headaches[ N ];  stroke[N  ];  vertigo[ N ];  seizures[ N ];   paresthesias[ N ];  difficulty walking[ N ];  Psych:depression[N  ]; anxiety[ N ];  Endocrine: diabetes[ N ];  thyroid dysfunction[ N ];   Physical Exam: BP 155/85 mmHg  Pulse 90  Temp(Src) 98.3 F (36.8 C) (Oral)  Resp 18  Ht 6\' 1"  (1.854 m)  Wt 210 lb 8.6 oz (95.5 kg)  BMI 27.78 kg/m2  SpO2 98%  General appearance: alert, cooperative and no distress HEENT:  Head-atraumatic, normocephalic Eyes-EOMI, PERRLA, sclera are non icteric Neck-supple, no JVD, no carotid bruit Heart: regular rate and rhythm, S1, S2 normal, no murmur, click, rub or gallop Lungs: clear to auscultation bilaterally Abdomen: soft, non-tender; bowel sounds normal; no masses,  no organomegaly Extremities: extremities normal, atraumatic, no cyanosis or edema Neurologic: intact No carotid bruits, right wrist cath site ok  Diagnostic Studies & Laboratory data:     Recent Radiology Findings:   Dg Chest 2 View  03/20/2014   CLINICAL DATA:  Shortness of breath.  EXAM: CHEST  2 VIEW  COMPARISON:  01/22/2010.  FINDINGS: Mediastinum and hilar structures are normal. Subsegmental atelectasis both lung bases. No focal alveolar infiltrate. No pleural effusion or pneumothorax. Stable apical pleural thickening noted consistent with scarring. Stable elevation right hemidiaphragm. Heart size normal. No acute bony abnormality.  IMPRESSION: Subsegmental atelectasis both lung bases. Stable elevation right hemidiaphragm   Electronically Signed   By: Marcello Moores  Register   On: 03/20/2014 08:58      Recent Lab Findings: Lab Results  Component Value Date   WBC 5.9 03/12/2014   HGB 13.2 03/12/2014   HCT 37.8* 03/12/2014   PLT 146* 03/12/2014   GLUCOSE 76 03/12/2014   ALT 36 01/22/2010   AST 36 01/22/2010   NA 143 03/12/2014   K 4.0 03/12/2014   CL 108 03/12/2014   CREATININE 1.17 03/12/2014   BUN 19 03/12/2014   CO2 25 03/12/2014   TSH 4.683* 03/12/2014   INR 0.95 03/12/2014     Assessment / Plan:   1. History of CAD-previous PTCA,BMS, and IRS to Diagonal 1 in 2000. Cardiac catheterization done today reveals 70% stenosis after Diagonal and a 100% proximal RCA occlusion. The LVEF is 55-60%. Echo and carotid duplex US pending  2. Essential hypertension-on Toprol XL 3. Hyperlipidemia-will start statin after surgery 4. History of tobacco abuse 5. History of prostate cancer 6. History of  daily  alcohol use-on Thiamine and monitor closely  I have seen patient and agree with cardiology that with 3 vessel cad with symptoms of sob with exertion and positive stress test CABG offers the best option for relief of symptoms.  The goals risks and alternatives of the planned surgical procedure CABG  have been discussed with the patient in detail. The risks of the procedure including death, infection, stroke, myocardial infarction, bleeding, blood transfusion have all been discussed specifically.  I have quoted Robert Boyle a 2 % of perioperative mortality and a complication rate as high as 20 %. The patient's questions have been answered.Robert Boyle is willing  to proceed with the planned procedure.  Grace Isaac MD      [address] [place] [address] Office [phone]   Beeper [phone]

## 2014-03-20 NOTE — CV Procedure (Signed)
CARDIAC CATHETERIZATION REPORT  NAME:  Robert Boyle   MRN: [id number] DOB:  04-21-1935   ADMIT DATE: 03/20/2014 Procedure Date: 03/20/2014  INTERVENTIONAL CARDIOLOGIST: Robert Boyle, M.D., MS PRIMARY CARE PROVIDER: Chesley Noon, MD PRIMARY CARDIOLOGIST: Robert Man, MD, MS  PATIENT:  Robert Boyle is a 78 y.o. male with prior known CAD (BMS PCI to the D2 in 2000 followed by PTCA of ISR in 2001). He had done well until this past year when he is been treated for prostate cancer. He is a personal friend and former patient of Dr. Chase Boyle, who is now retired. Dr. Rex Boyle recommended that the patient contact our office for evaluation of profound exertional and resting dyspnea and now chest tightness. He underwent nuclear stress testing was devastated inferior ischemia that was read as intermediate risk.  He was then seen for urgent evaluation post stress test to discuss his symptoms and he was arranged to undergo cardiac catheterization plus minus PCI.  PRE-OPERATIVE DIAGNOSIS:    Class IV Angina  Intermediate Risk Myoview Stress Test  PROCEDURES PERFORMED:    Left Heart Catheterization with Native Coronary Angiography  via Right Radial Artery   Left Ventriculography  PROCEDURE: The patient was brought to the [facility]nd [facility] in the fasting state and prepped and draped in the usual sterile fashion for Right Radial artery access. A modified Allen's test was performed on the right wrist demonstrating excellent collateral flow for radial access.   Sterile technique was used including antiseptics, cap, gloves, gown, hand hygiene, mask and sheet. Skin prep: Chlorhexidine.   Consent: Risks of procedure as well as the alternatives and risks of each were explained to the (patient/caregiver). Consent for procedure obtained.   Time Out: Verified patient identification, verified procedure, site/side was marked, verified correct patient position,  special equipment/implants available, medications/allergies/relevent history reviewed, required imaging and test results available. Performed.  Access:   Right Radial Artery: 6 Fr Sheath -  Seldinger Technique (Angiocath Micropuncture Kit)  Radial Cocktail - 10 mL; IV Heparin 5000 Units   Left Heart Catheterization: 5 Fr Catheters advanced or exchanged over a long exchange safety J-wire; TIG 4.0 catheter advanced first.  Left And Right Coronary Artery Cineangiography: TIG 4.0 Catheter   LV Hemodynamics (LV Gram): Angled pigtail  Sheath removed in the cardiac Cath Lab with TR band placement for hemostasis.  TR Band: 0835  Hours; 15 mL air  FINDINGS:  Hemodynamics:   Central Aortic Pressure / Mean: 129/69/95 mmHg  Left Ventricular Pressure / LVEDP: 131/2/8 mmHg  Left Ventriculography:  EF: 55-60 %  Wall Motion: No regional abnormalities Noted  Coronary Anatomy:  Dominance: Right  Left Main: Caliber, mildly calcified vessel that bifurcates distally into the LAD and Circumflex. Mild 10% distal stenosis. LAD: Normal caliber vessel that gives off 2 proximal diagonal branches. The takeoff of D1-2 has a roughly 40-50% stenosis. After D2, there is a tubular segment of roughly 70% stenosis at the takeoff of several septal perforators and provide left-to-right collaterals to the PDA. Beyond the septal perforators the vessel normalizes until it wraps down around the apex with is a long segmental 50% stenosis. The vessel wraps the apex perfusing the inferoapex.  D1: Moderate caliber vessel with ostial/proximal 70% stenosis  D2: Moderate caliber vessel with a bare metal stent that has diffuse mild in-stent restenosis and then just following the stent, it bifurcates into 2 small-moderate caliber branches. The more significant medial branch has a focal 80% stenosis.  Left Circumflex: Moderate caliber, nondominant vessel that gives off 2 small marginal branches proximally and then terminates as  a posterior lateral OM branch with 2 small branches coming off one being the distal AV groove circumflex. Minimal luminal irregularities. The distal circumflex system provides left to right collaterals to the right posterior lateral system.   RCA: Large-caliber vessel that is 100% occluded proximally at the takeoff of the proximal atrial branch. The Right posterior lateral system is filled via left-to-right collaterals from the circumflex and diagonal branches. The Right Posterior Descending Artery is filled via septal perforator collaterals from the LAD.   After reviewing the initial angiography, the culprit lesion for abnormal myoview was thought to be 100% prox RCA occlusion, but with LAD & D1 severe disease, he is best served with CABG.   MEDICATIONS:  Anesthesia:  Local Lidocaine 2 ml  Sedation:  2 mg IV Versed, 50 mcg IV fentanyl   Omnipaque Contrast: 80 ml  Anticoagulation:  IV Heparin 5000 Units ;  Radial Cocktail: 5 mg Verapamil, 400 mcg NTG, 2 ml 2% Lidocaine in 10 ml NS IC NTG 200 mcg x 1  PATIENT DISPOSITION:    The patient was transferred to the PACU holding area in a hemodynamicaly stable, chest pain free condition.  The patient tolerated the procedure well, and there were no complications.  EBL:   < 10 ml  The patient was stable before, during, and after the procedure.  POST-OPERATIVE DIAGNOSIS:    Severe 3 Vessel CAD with 100% prox RCA (filled via L-R collaterals for both RPL & PDA), 40-50 % prox & 80% mid LAD with 80% post stent stenosis of D2.  Normal LVEF with no regional WMA suggesting adequate collateralization of the RCA distribution.  PLAN OF CARE:  Post Radial Cath care  TCTS consult for CABG => pt requests Dr. Cyndia Boyle or Robert Boyle  OK to d/c home once TCTS has all necessary data  Per discussion with Dr. Rex Boyle, he will likely need CIWA Coverage for an inpatient stay -- will start Valium 86m PO qPM & pt's family has been instructed to ensure no EtOH  until after CABG.   HLeonie Boyle M.D., M.S. Interventional Cardiologist   Pager # [phone]

## 2014-03-20 NOTE — Progress Notes (Signed)
Discussed sternal precautions, mobility, IS, OHS book and guideline and watching video. Voiced understanding. Significant other, Zigmund Daniel, present.  [place], ACSM 2:26 PM 03/20/2014

## 2014-03-20 NOTE — Interval H&P Note (Signed)
History and Physical Interval Note:  03/20/2014 7:57 AM  Robert Boyle  has presented today for surgery, with the diagnosis of Class IV Angina (with DOE) & Abnormal Stress Test. Patient Active Problem List   Diagnosis Date Noted  . CAD STATUS post bare-metal stent to diagonal 2000 ([id number] 2.5x18), treated for in stent restenosis 2001 10/17/1998    Priority: High  . Hyperlipidemia with target LDL less than 70 10/17/1998    Priority: Medium  . Essential hypertension 09/19/1998    Priority: Medium  . Weight gain 03/13/2014    Priority: Low  . Angina, class IV 03/13/2014  . Abnormal nuclear stress test 03/13/2014  . Other fatigue 03/13/2014  . Prostate cancer    The various methods of treatment have been discussed with the patient and family. After consideration of risks, benefits and other options for treatment, the patient has consented to  Procedure(s): LEFT HEART CATHETERIZATION WITH CORONARY ANGIOGRAM (N/A) +/- PCI as a surgical intervention .  The patient's history has been reviewed, patient examined, no change in status, stable for surgery.  I have reviewed the patient's chart and labs.  Questions were answered to the patient's satisfaction.    Cath Lab Visit (complete for each Cath Lab visit)  Clinical Evaluation Leading to the Procedure:   ACS: No.  Non-ACS:    Anginal Classification: CCS IV  Anti-ischemic medical therapy: Maximal Therapy (2 or more classes of medications)  Non-Invasive Test Results: Intermediate-risk stress test findings: cardiac mortality 1-3%/year  Prior CABG: No previous CABG  Appropriateness of Use: Ischemic Symptoms? CCS IV (Inability to carry on any physical activity without discomfort) Anti-ischemic Medical Therapy? Maximal Medical Therapy (2 or more classes of medications) Non-invasive Test Results? Intermediate-risk stress test findings: cardiac mortality 1-3%/year Prior CABG? No Previous CABG   Patient Information:   1-2V CAD, no  prox LAD  A (8)  Indication: 17; Score: 8   Patient Information:   CTO of 1 vessel, no other CAD  A (7)  Indication: 27; Score: 7   Patient Information:   1V CAD with prox LAD  A (9)  Indication: 33; Score: 9   Patient Information:   2V-CAD with prox LAD  A (9)  Indication: 39; Score: 9   Patient Information:   3V-CAD without LMCA  A (9)  Indication: 45; Score: 9   Patient Information:   3V-CAD without LMCA With Abnormal LV systolic function  A (9)  Indication: 48; Score: 9   Patient Information:   LMCA-CAD  A (9)  Indication: 49; Score: 9   Patient Information:   2V-CAD with prox LAD PCI  A (7)  Indication: 62; Score: 7   Patient Information:   2V-CAD with prox LAD CABG  A (8)  Indication: 62; Score: 8   Patient Information:   3V-CAD without LMCA With Low CAD burden(i.e., 3 focal stenoses, low SYNTAX score) PCI  A (7)  Indication: 63; Score: 7   Patient Information:   3V-CAD without LMCA With Low CAD burden(i.e., 3 focal stenoses, low SYNTAX score) CABG  A (9)  Indication: 63; Score: 9   Patient Information:   3V-CAD without LMCA E06c - Intermediate-high CAD burden (i.e., multiple diffuse lesions, presence of CTO, or high SYNTAX score) PCI  U (4)  Indication: 64; Score: 4   Patient Information:   3V-CAD without LMCA E06c - Intermediate-high CAD burden (i.e., multiple diffuse lesions, presence of CTO, or high SYNTAX score) CABG  A (9)  Indication: 64; Score:  9   Patient Information:   LMCA-CAD With Isolated LMCA stenosis  PCI  U (6)  Indication: 65; Score: 6   Patient Information:   LMCA-CAD With Isolated LMCA stenosis  CABG  A (9)  Indication: 65; Score: 9   Patient Information:   LMCA-CAD Additional CAD, low CAD burden (i.e., 1- to 2-vessel additional involvement, low SYNTAX score) PCI  U (5)  Indication: 66; Score: 5   Patient Information:   LMCA-CAD Additional CAD, low CAD burden  (i.e., 1- to 2-vessel additional involvement, low SYNTAX score) CABG  A (9)  Indication: 66; Score: 9   Patient Information:   LMCA-CAD Additional CAD, intermediate-high CAD burden (i.e., 3-vessel involvement, presence of CTO, or high SYNTAX score) PCI  I (3)  Indication: 67; Score: 3   Patient Information:   LMCA-CAD Additional CAD, intermediate-high CAD burden (i.e., 3-vessel involvement, presence of CTO, or high SYNTAX score) CABG  A (9)  Indication: 67; Score: 9   HARDING,DAVID W

## 2014-03-20 NOTE — Progress Notes (Signed)
VASCULAR LAB PRELIMINARY  PRELIMINARY  PRELIMINARY  PRELIMINARY  Pre-op Cardiac Surgery  Carotid Findings:  Bilateral:  1-39% ICA stenosis.  Vertebral artery flow is antegrade.      Upper Extremity Right Left  Brachial Pressures triphasic 169 triphasic  Radial Waveforms triphasic triphasic  Ulnar Waveforms triphasic triphasic  Palmar Arch (Allen's Test) * **WNL   Findings:  *Right:  Allen's test and right blood pressure not obtained due to cardiac cath via right radial artery this AM.  **Left:  Doppler waveforms remain normal with ulnar and radial compressions.    Lower  Extremity Right Left  Dorsalis Pedis    Anterior Tibial    Posterior Tibial    Ankle/Brachial Indices      Findings:  Palpable pedal pulses x 4.   Robert Boyle, RVT 03/20/2014, 1:38 PM

## 2014-03-20 NOTE — Plan of Care (Signed)
Problem: Phase I - Pre-Op Goal: Point person for discharge identified Outcome: Completed/Met Date Met:  03/20/14

## 2014-03-21 ENCOUNTER — Inpatient Hospital Stay (HOSPITAL_COMMUNITY): Payer: Medicare HMO

## 2014-03-21 ENCOUNTER — Encounter (HOSPITAL_COMMUNITY)
Admission: RE | Disposition: A | Discharge status: Home Health | Payer: Medicare HMO | Source: Ambulatory Visit | Attending: Cardiothoracic Surgery

## 2014-03-21 ENCOUNTER — Inpatient Hospital Stay (HOSPITAL_COMMUNITY): Payer: Medicare HMO | Admitting: Certified Registered Nurse Anesthetist

## 2014-03-21 ENCOUNTER — Ambulatory Visit: Payer: Medicare HMO | Admitting: Cardiovascular Disease

## 2014-03-21 DIAGNOSIS — Z951 Presence of aortocoronary bypass graft: Secondary | ICD-10-CM

## 2014-03-21 DIAGNOSIS — I2510 Atherosclerotic heart disease of native coronary artery without angina pectoris: Secondary | ICD-10-CM

## 2014-03-21 HISTORY — PX: CORONARY ARTERY BYPASS GRAFT: SHX141

## 2014-03-21 HISTORY — PX: TEE WITHOUT CARDIOVERSION: SHX5443

## 2014-03-21 HISTORY — DX: Presence of aortocoronary bypass graft: Z95.1

## 2014-03-21 LAB — HEMOGLOBIN AND HEMATOCRIT, BLOOD
HCT: 24.2 % — ABNORMAL LOW (ref 39.0–52.0)
Hemoglobin: 8.3 g/dL — ABNORMAL LOW (ref 13.0–17.0)

## 2014-03-21 LAB — POCT I-STAT 3, ART BLOOD GAS (G3+)
Acid-base deficit: 3 mmol/L — ABNORMAL HIGH (ref 0.0–2.0)
Acid-base deficit: 3 mmol/L — ABNORMAL HIGH (ref 0.0–2.0)
Acid-base deficit: 5 mmol/L — ABNORMAL HIGH (ref 0.0–2.0)
BICARBONATE: 21.9 meq/L (ref 20.0–24.0)
BICARBONATE: 20.6 meq/L (ref 20.0–24.0)
Bicarbonate: 24.4 mEq/L — ABNORMAL HIGH (ref 20.0–24.0)
Bicarbonate: 21.7 mEq/L (ref 20.0–24.0)
O2 SAT: 93 %
O2 Saturation: 100 %
O2 Saturation: 95 %
O2 Saturation: 92 %
PCO2 ART: 34.4 mmHg — AB (ref 35.0–45.0)
PH ART: 7.424 (ref 7.350–7.450)
PH ART: 7.406 (ref 7.350–7.450)
PH ART: 7.327 — AB (ref 7.350–7.450)
Patient temperature: 36.5
TCO2: 26 mmol/L (ref 0–100)
TCO2: 23 mmol/L (ref 0–100)
TCO2: 23 mmol/L (ref 0–100)
TCO2: 22 mmol/L (ref 0–100)
pCO2 arterial: 37.2 mmHg (ref 35.0–45.0)
pCO2 arterial: 37.6 mmHg (ref 35.0–45.0)
pCO2 arterial: 39.8 mmHg (ref 35.0–45.0)
pH, Arterial: 7.372 (ref 7.350–7.450)
pO2, Arterial: 342 mmHg — ABNORMAL HIGH (ref 80.0–100.0)
pO2, Arterial: 74 mmHg — ABNORMAL LOW (ref 80.0–100.0)
pO2, Arterial: 68 mmHg — ABNORMAL LOW (ref 80.0–100.0)
pO2, Arterial: 70 mmHg — ABNORMAL LOW (ref 80.0–100.0)

## 2014-03-21 LAB — APTT: aPTT: 32 seconds (ref 24–37)

## 2014-03-21 LAB — GLUCOSE, CAPILLARY
GLUCOSE-CAPILLARY: 105 mg/dL — AB (ref 70–99)
Glucose-Capillary: 99 mg/dL (ref 70–99)
Glucose-Capillary: 101 mg/dL — ABNORMAL HIGH (ref 70–99)
Glucose-Capillary: 109 mg/dL — ABNORMAL HIGH (ref 70–99)

## 2014-03-21 LAB — POCT I-STAT, CHEM 8
BUN: 14 mg/dL (ref 6–23)
BUN: 14 mg/dL (ref 6–23)
BUN: 12 mg/dL (ref 6–23)
BUN: 14 mg/dL (ref 6–23)
BUN: 13 mg/dL (ref 6–23)
CHLORIDE: 103 meq/L (ref 96–112)
CHLORIDE: 97 meq/L (ref 96–112)
CHLORIDE: 105 meq/L (ref 96–112)
CREATININE: 0.6 mg/dL (ref 0.50–1.35)
CREATININE: 0.8 mg/dL (ref 0.50–1.35)
CREATININE: 0.9 mg/dL (ref 0.50–1.35)
Calcium, Ion: 1.31 mmol/L — ABNORMAL HIGH (ref 1.13–1.30)
Calcium, Ion: 1.26 mmol/L (ref 1.13–1.30)
Calcium, Ion: 1.04 mmol/L — ABNORMAL LOW (ref 1.13–1.30)
Calcium, Ion: 1.11 mmol/L — ABNORMAL LOW (ref 1.13–1.30)
Calcium, Ion: 1.22 mmol/L (ref 1.13–1.30)
Chloride: 105 mEq/L (ref 96–112)
Chloride: 99 mEq/L (ref 96–112)
Creatinine, Ser: 0.7 mg/dL (ref 0.50–1.35)
Creatinine, Ser: 0.8 mg/dL (ref 0.50–1.35)
GLUCOSE: 93 mg/dL (ref 70–99)
Glucose, Bld: 92 mg/dL (ref 70–99)
Glucose, Bld: 92 mg/dL (ref 70–99)
Glucose, Bld: 86 mg/dL (ref 70–99)
Glucose, Bld: 134 mg/dL — ABNORMAL HIGH (ref 70–99)
HCT: 36 % — ABNORMAL LOW (ref 39.0–52.0)
HCT: 31 % — ABNORMAL LOW (ref 39.0–52.0)
HCT: 26 % — ABNORMAL LOW (ref 39.0–52.0)
HEMATOCRIT: 23 % — AB (ref 39.0–52.0)
HEMATOCRIT: 31 % — AB (ref 39.0–52.0)
Hemoglobin: 12.2 g/dL — ABNORMAL LOW (ref 13.0–17.0)
Hemoglobin: 10.5 g/dL — ABNORMAL LOW (ref 13.0–17.0)
Hemoglobin: 8.8 g/dL — ABNORMAL LOW (ref 13.0–17.0)
Hemoglobin: 7.8 g/dL — ABNORMAL LOW (ref 13.0–17.0)
Hemoglobin: 10.5 g/dL — ABNORMAL LOW (ref 13.0–17.0)
POTASSIUM: 3.6 meq/L — AB (ref 3.7–5.3)
POTASSIUM: 3.3 meq/L — AB (ref 3.7–5.3)
POTASSIUM: 4.1 meq/L (ref 3.7–5.3)
POTASSIUM: 4.3 meq/L (ref 3.7–5.3)
Potassium: 3.5 mEq/L — ABNORMAL LOW (ref 3.7–5.3)
SODIUM: 139 meq/L (ref 137–147)
SODIUM: 137 meq/L (ref 137–147)
Sodium: 136 mEq/L — ABNORMAL LOW (ref 137–147)
Sodium: 130 mEq/L — ABNORMAL LOW (ref 137–147)
Sodium: 138 mEq/L (ref 137–147)
TCO2: 22 mmol/L (ref 0–100)
TCO2: 22 mmol/L (ref 0–100)
TCO2: 22 mmol/L (ref 0–100)
TCO2: 21 mmol/L (ref 0–100)
TCO2: 19 mmol/L (ref 0–100)

## 2014-03-21 LAB — HEMOGLOBIN A1C
Hgb A1c MFr Bld: 5.4 % (ref <5.7)
Hgb A1c MFr Bld: 5.6 % (ref <5.7)
Mean Plasma Glucose: 108 mg/dL (ref <117)
Mean Plasma Glucose: 114 mg/dL (ref <117)

## 2014-03-21 LAB — CREATININE, SERUM
Creatinine, Ser: 0.95 mg/dL (ref 0.50–1.35)
GFR calc Af Amer: >90 mL/min (ref >90)
GFR calc non Af Amer: 79 mL/min — ABNORMAL LOW (ref >90)

## 2014-03-21 LAB — CBC
HCT: 30.6 % — ABNORMAL LOW (ref 39.0–52.0)
HEMATOCRIT: 32 % — AB (ref 39.0–52.0)
HEMOGLOBIN: 10.7 g/dL — AB (ref 13.0–17.0)
Hemoglobin: 10.3 g/dL — ABNORMAL LOW (ref 13.0–17.0)
MCH: 31.3 pg (ref 26.0–34.0)
MCH: 31.8 pg (ref 26.0–34.0)
MCHC: 33.4 g/dL (ref 30.0–36.0)
MCHC: 33.7 g/dL (ref 30.0–36.0)
MCV: 93.6 fL (ref 78.0–100.0)
MCV: 94.4 fL (ref 78.0–100.0)
Platelets: 71 10*3/uL — ABNORMAL LOW (ref 150–400)
Platelets: 69 10*3/uL — ABNORMAL LOW (ref 150–400)
RBC: 3.42 MIL/uL — AB (ref 4.22–5.81)
RBC: 3.24 MIL/uL — ABNORMAL LOW (ref 4.22–5.81)
RDW: 14.4 % (ref 11.5–15.5)
RDW: 14.6 % (ref 11.5–15.5)
WBC: 5.7 10*3/uL (ref 4.0–10.5)
WBC: 5 10*3/uL (ref 4.0–10.5)

## 2014-03-21 LAB — PREPARE RBC (CROSSMATCH)

## 2014-03-21 LAB — POCT I-STAT 4, (NA,K, GLUC, HGB,HCT)
GLUCOSE: 105 mg/dL — AB (ref 70–99)
HEMATOCRIT: 33 % — AB (ref 39.0–52.0)
HEMOGLOBIN: 11.2 g/dL — AB (ref 13.0–17.0)
Potassium: 3.7 mEq/L (ref 3.7–5.3)
Sodium: 136 mEq/L — ABNORMAL LOW (ref 137–147)

## 2014-03-21 LAB — PROTIME-INR
INR: 1.32 (ref 0.00–1.49)
Prothrombin Time: 16.5 seconds — ABNORMAL HIGH (ref 11.6–15.2)

## 2014-03-21 LAB — PLATELET COUNT: Platelets: 60 10*3/uL — ABNORMAL LOW (ref 150–400)

## 2014-03-21 LAB — MAGNESIUM: Magnesium: 3 mg/dL — ABNORMAL HIGH (ref 1.5–2.5)

## 2014-03-21 SURGERY — CORONARY ARTERY BYPASS GRAFTING (CABG)
Anesthesia: General | Site: Chest

## 2014-03-21 MED ORDER — LIDOCAINE HCL (CARDIAC) 20 MG/ML IV SOLN
INTRAVENOUS | Status: DC | PRN
  Administered 2014-03-21: 100 mg via INTRAVENOUS

## 2014-03-21 MED ORDER — MIDAZOLAM HCL 10 MG/2ML IJ SOLN
INTRAMUSCULAR | Status: AC
  Filled 2014-03-21: qty 2

## 2014-03-21 MED ORDER — MIDAZOLAM HCL 5 MG/5ML IJ SOLN
INTRAMUSCULAR | Status: DC | PRN
  Administered 2014-03-21: 1 mg via INTRAVENOUS
  Administered 2014-03-21: 6 mg via INTRAVENOUS
  Administered 2014-03-21: 3 mg via INTRAVENOUS

## 2014-03-21 MED ORDER — ARTIFICIAL TEARS OP OINT
TOPICAL_OINTMENT | OPHTHALMIC | Status: AC
  Filled 2014-03-21: qty 3.5

## 2014-03-21 MED ORDER — SODIUM CHLORIDE 0.9 % IJ SOLN
OROMUCOSAL | Status: DC | PRN
  Administered 2014-03-21 (×3): via TOPICAL

## 2014-03-21 MED ORDER — PANTOPRAZOLE SODIUM 40 MG PO TBEC
40.0000 mg | DELAYED_RELEASE_TABLET | Freq: Every day | ORAL | Status: DC
  Administered 2014-03-23 – 2014-03-27 (×5): 40 mg via ORAL
  Filled 2014-03-21 (×4): qty 1

## 2014-03-21 MED ORDER — SODIUM CHLORIDE 0.9 % IJ SOLN
3.0000 mL | Freq: Two times a day (BID) | INTRAMUSCULAR | Status: DC
  Administered 2014-03-22: 3 mL via INTRAVENOUS
  Administered 2014-03-22: 10 mL via INTRAVENOUS

## 2014-03-21 MED ORDER — ASPIRIN 81 MG PO CHEW: 324.0000 mg | CHEWABLE_TABLET | Freq: Every day | ORAL | Status: DC

## 2014-03-21 MED ORDER — LACTATED RINGERS IV SOLN
INTRAVENOUS | Status: DC | PRN
  Administered 2014-03-21: 08:00:00 via INTRAVENOUS

## 2014-03-21 MED ORDER — MAGNESIUM SULFATE 4 GM/100ML IV SOLN
4.0000 g | Freq: Once | INTRAVENOUS | Status: AC
  Administered 2014-03-21: 4 g via INTRAVENOUS
  Filled 2014-03-21: qty 100

## 2014-03-21 MED ORDER — LACTATED RINGERS IV SOLN
INTRAVENOUS | Status: DC
  Administered 2014-03-21: 15:00:00 via INTRAVENOUS

## 2014-03-21 MED ORDER — POTASSIUM CHLORIDE 10 MEQ/50ML IV SOLN
10.0000 meq | INTRAVENOUS | Status: AC
  Administered 2014-03-21 (×3): 10 meq via INTRAVENOUS

## 2014-03-21 MED ORDER — ALBUMIN HUMAN 5 % IV SOLN
250.0000 mL | INTRAVENOUS | Status: AC | PRN
  Administered 2014-03-21 (×3): 250 mL via INTRAVENOUS
  Filled 2014-03-21: qty 250

## 2014-03-21 MED ORDER — FENTANYL CITRATE 0.05 MG/ML IJ SOLN
INTRAMUSCULAR | Status: AC
  Filled 2014-03-21: qty 5

## 2014-03-21 MED ORDER — ASPIRIN EC 325 MG PO TBEC
325.0000 mg | DELAYED_RELEASE_TABLET | Freq: Every day | ORAL | Status: DC
  Administered 2014-03-22 – 2014-03-23 (×2): 325 mg via ORAL
  Filled 2014-03-21 (×2): qty 1

## 2014-03-21 MED ORDER — FENTANYL CITRATE 0.05 MG/ML IJ SOLN
INTRAMUSCULAR | Status: DC | PRN
  Administered 2014-03-21: 100 ug via INTRAVENOUS
  Administered 2014-03-21: 200 ug via INTRAVENOUS
  Administered 2014-03-21: 50 ug via INTRAVENOUS
  Administered 2014-03-21: 250 ug via INTRAVENOUS
  Administered 2014-03-21 (×2): 100 ug via INTRAVENOUS
  Administered 2014-03-21: 150 ug via INTRAVENOUS
  Administered 2014-03-21: 250 ug via INTRAVENOUS
  Administered 2014-03-21 (×2): 150 ug via INTRAVENOUS

## 2014-03-21 MED ORDER — DEXMEDETOMIDINE HCL IN NACL 200 MCG/50ML IV SOLN
0.0000 ug/kg/h | INTRAVENOUS | Status: DC
  Administered 2014-03-21: 0.3 ug/kg/h via INTRAVENOUS
  Filled 2014-03-21: qty 50

## 2014-03-21 MED ORDER — DEXTROSE 5 % IV SOLN
1.5000 g | Freq: Two times a day (BID) | INTRAVENOUS | Status: DC
  Administered 2014-03-21 – 2014-03-22 (×3): 1.5 g via INTRAVENOUS
  Filled 2014-03-21 (×4): qty 1.5

## 2014-03-21 MED ORDER — ATORVASTATIN CALCIUM 80 MG PO TABS
80.0000 mg | ORAL_TABLET | Freq: Every day | ORAL | Status: DC
  Administered 2014-03-22 – 2014-03-27 (×6): 80 mg via ORAL
  Filled 2014-03-21 (×6): qty 1

## 2014-03-21 MED ORDER — MORPHINE SULFATE 2 MG/ML IJ SOLN
1.0000 mg | INTRAMUSCULAR | Status: AC | PRN
  Administered 2014-03-21: 2 mg via INTRAVENOUS
  Filled 2014-03-21: qty 1

## 2014-03-21 MED ORDER — INSULIN REGULAR HUMAN 100 UNIT/ML IJ SOLN
INTRAMUSCULAR | Status: DC
  Administered 2014-03-21: 0.5 [IU]/h via INTRAVENOUS
  Filled 2014-03-21: qty 2.5

## 2014-03-21 MED ORDER — ACETAMINOPHEN 650 MG RE SUPP
650.0000 mg | Freq: Once | RECTAL | Status: AC
  Administered 2014-03-21: 650 mg via RECTAL

## 2014-03-21 MED ORDER — ALBUMIN HUMAN 5 % IV SOLN
INTRAVENOUS | Status: DC | PRN
  Administered 2014-03-21: 13:00:00 via INTRAVENOUS

## 2014-03-21 MED ORDER — PROPOFOL 10 MG/ML IV BOLUS
INTRAVENOUS | Status: AC
Start: 2014-03-21 — End: 2014-03-21
  Filled 2014-03-21: qty 20

## 2014-03-21 MED ORDER — SODIUM CHLORIDE 0.45 % IV SOLN
INTRAVENOUS | Status: DC
  Administered 2014-03-21: 15:00:00 via INTRAVENOUS

## 2014-03-21 MED ORDER — ROCURONIUM BROMIDE 50 MG/5ML IV SOLN
INTRAVENOUS | Status: AC
  Filled 2014-03-21: qty 1

## 2014-03-21 MED ORDER — PHENYLEPHRINE HCL 10 MG/ML IJ SOLN
0.0000 ug/min | INTRAVENOUS | Status: DC
  Filled 2014-03-21: qty 2

## 2014-03-21 MED ORDER — ARTIFICIAL TEARS OP OINT
TOPICAL_OINTMENT | OPHTHALMIC | Status: DC | PRN
  Administered 2014-03-21: 1 via OPHTHALMIC

## 2014-03-21 MED ORDER — INSULIN ASPART 100 UNIT/ML ~~LOC~~ SOLN: 0.0000 [IU] | SUBCUTANEOUS | Status: DC

## 2014-03-21 MED ORDER — BISACODYL 10 MG RE SUPP: 10.0000 mg | Freq: Every day | RECTAL | Status: DC

## 2014-03-21 MED ORDER — SODIUM CHLORIDE 0.9 % IJ SOLN: 3.0000 mL | INTRAMUSCULAR | Status: DC | PRN

## 2014-03-21 MED ORDER — EPHEDRINE SULFATE 50 MG/ML IJ SOLN
INTRAMUSCULAR | Status: AC
  Filled 2014-03-21: qty 1

## 2014-03-21 MED ORDER — FAMOTIDINE IN NACL 20-0.9 MG/50ML-% IV SOLN
20.0000 mg | Freq: Two times a day (BID) | INTRAVENOUS | Status: AC
  Administered 2014-03-21: 20 mg via INTRAVENOUS

## 2014-03-21 MED ORDER — ROCURONIUM BROMIDE 100 MG/10ML IV SOLN
INTRAVENOUS | Status: DC | PRN
  Administered 2014-03-21 (×4): 50 mg via INTRAVENOUS

## 2014-03-21 MED ORDER — ONDANSETRON HCL 4 MG/2ML IJ SOLN
INTRAMUSCULAR | Status: AC
  Filled 2014-03-21: qty 2

## 2014-03-21 MED ORDER — PROPOFOL 10 MG/ML IV BOLUS
INTRAVENOUS | Status: DC | PRN
  Administered 2014-03-21: 50 mg via INTRAVENOUS

## 2014-03-21 MED ORDER — MORPHINE SULFATE 2 MG/ML IJ SOLN
2.0000 mg | INTRAMUSCULAR | Status: DC | PRN
  Administered 2014-03-21 – 2014-03-22 (×7): 2 mg via INTRAVENOUS
  Filled 2014-03-21 (×7): qty 1

## 2014-03-21 MED ORDER — BISACODYL 5 MG PO TBEC
10.0000 mg | DELAYED_RELEASE_TABLET | Freq: Every day | ORAL | Status: DC
  Administered 2014-03-22 – 2014-03-23 (×2): 10 mg via ORAL
  Filled 2014-03-21 (×2): qty 2

## 2014-03-21 MED ORDER — DOCUSATE SODIUM 100 MG PO CAPS
200.0000 mg | ORAL_CAPSULE | Freq: Every day | ORAL | Status: DC
  Administered 2014-03-22 – 2014-03-23 (×2): 200 mg via ORAL
  Filled 2014-03-21 (×2): qty 2

## 2014-03-21 MED ORDER — HEPARIN SODIUM (PORCINE) 1000 UNIT/ML IJ SOLN
INTRAMUSCULAR | Status: DC | PRN
  Administered 2014-03-21: 30000 [IU] via INTRAVENOUS

## 2014-03-21 MED ORDER — CETYLPYRIDINIUM CHLORIDE 0.05 % MT LIQD
7.0000 mL | Freq: Two times a day (BID) | OROMUCOSAL | Status: DC
  Administered 2014-03-21 – 2014-03-27 (×12): 7 mL via OROMUCOSAL

## 2014-03-21 MED ORDER — SODIUM CHLORIDE 0.9 % IV SOLN: 250.0000 mL | INTRAVENOUS | Status: DC

## 2014-03-21 MED ORDER — TRAMADOL HCL 50 MG PO TABS
50.0000 mg | ORAL_TABLET | ORAL | Status: DC | PRN
  Administered 2014-03-23: 50 mg via ORAL
  Administered 2014-03-24: 100 mg via ORAL

## 2014-03-21 MED ORDER — METOPROLOL TARTRATE 12.5 MG HALF TABLET
12.5000 mg | ORAL_TABLET | Freq: Two times a day (BID) | ORAL | Status: DC
  Administered 2014-03-22 – 2014-03-23 (×4): 12.5 mg via ORAL
  Filled 2014-03-21 (×7): qty 1

## 2014-03-21 MED ORDER — LIDOCAINE HCL (CARDIAC) 20 MG/ML IV SOLN
INTRAVENOUS | Status: AC
  Filled 2014-03-21: qty 5

## 2014-03-21 MED ORDER — OXYCODONE HCL 5 MG PO TABS
5.0000 mg | ORAL_TABLET | ORAL | Status: DC | PRN
  Administered 2014-03-22 (×3): 5 mg via ORAL
  Administered 2014-03-22 – 2014-03-24 (×5): 10 mg via ORAL
  Administered 2014-03-24: 5 mg via ORAL
  Administered 2014-03-24 – 2014-03-27 (×12): 10 mg via ORAL
  Filled 2014-03-21 (×4): qty 2
  Filled 2014-03-21 (×2): qty 1
  Filled 2014-03-21 (×8): qty 2
  Filled 2014-03-21: qty 1
  Filled 2014-03-21 (×4): qty 2

## 2014-03-21 MED ORDER — FENTANYL CITRATE 0.05 MG/ML IJ SOLN
INTRAMUSCULAR | Status: AC
Start: 2014-03-21 — End: 2014-03-21
  Filled 2014-03-21: qty 5

## 2014-03-21 MED ORDER — VANCOMYCIN HCL IN DEXTROSE 1-5 GM/200ML-% IV SOLN
1000.0000 mg | Freq: Once | INTRAVENOUS | Status: AC
  Administered 2014-03-21: 1000 mg via INTRAVENOUS
  Filled 2014-03-21: qty 200

## 2014-03-21 MED ORDER — LACTATED RINGERS IV SOLN: 500.0000 mL | Freq: Once | INTRAVENOUS | Status: AC | PRN

## 2014-03-21 MED ORDER — ACETAMINOPHEN 160 MG/5ML PO SOLN: 1000.0000 mg | Freq: Four times a day (QID) | ORAL | Status: DC

## 2014-03-21 MED ORDER — ACETAMINOPHEN 500 MG PO TABS
1000.0000 mg | ORAL_TABLET | Freq: Four times a day (QID) | ORAL | Status: DC
  Administered 2014-03-21 – 2014-03-23 (×6): 1000 mg via ORAL
  Filled 2014-03-21 (×10): qty 2

## 2014-03-21 MED ORDER — ACETAMINOPHEN 160 MG/5ML PO SOLN: 650.0000 mg | Freq: Once | ORAL | Status: AC

## 2014-03-21 MED ORDER — STERILE WATER FOR INJECTION IJ SOLN
INTRAMUSCULAR | Status: AC
  Filled 2014-03-21: qty 10

## 2014-03-21 MED ORDER — MIDAZOLAM HCL 2 MG/2ML IJ SOLN: 2.0000 mg | INTRAMUSCULAR | Status: DC | PRN

## 2014-03-21 MED ORDER — NITROGLYCERIN IN D5W 200-5 MCG/ML-% IV SOLN: 0.0000 ug/min | INTRAVENOUS | Status: DC

## 2014-03-21 MED ORDER — HEMOSTATIC AGENTS (NO CHARGE) OPTIME
TOPICAL | Status: DC | PRN
  Administered 2014-03-21: 1 via TOPICAL

## 2014-03-21 MED ORDER — PROTAMINE SULFATE 10 MG/ML IV SOLN
INTRAVENOUS | Status: DC | PRN
  Administered 2014-03-21: 300 mg via INTRAVENOUS

## 2014-03-21 MED ORDER — 0.9 % SODIUM CHLORIDE (POUR BTL) OPTIME
TOPICAL | Status: DC | PRN
  Administered 2014-03-21: 6000 mL

## 2014-03-21 MED ORDER — ONDANSETRON HCL 4 MG/2ML IJ SOLN: 4.0000 mg | Freq: Four times a day (QID) | INTRAMUSCULAR | Status: DC | PRN

## 2014-03-21 MED ORDER — INSULIN REGULAR BOLUS VIA INFUSION
0.0000 [IU] | Freq: Three times a day (TID) | INTRAVENOUS | Status: DC
  Filled 2014-03-21: qty 10

## 2014-03-21 MED ORDER — METOPROLOL TARTRATE 1 MG/ML IV SOLN: 2.5000 mg | INTRAVENOUS | Status: DC | PRN

## 2014-03-21 MED ORDER — SODIUM CHLORIDE 0.9 % IV SOLN
INTRAVENOUS | Status: DC
  Administered 2014-03-21: 15:00:00 via INTRAVENOUS

## 2014-03-21 MED ORDER — METOPROLOL TARTRATE 25 MG/10 ML ORAL SUSPENSION
12.5000 mg | Freq: Two times a day (BID) | ORAL | Status: DC
  Filled 2014-03-21 (×7): qty 5

## 2014-03-21 SURGICAL SUPPLY — 80 items
ADH SKN CLS APL DERMABOND .7 (GAUZE/BANDAGES/DRESSINGS) ×2
ATTRACTOMAT 16X20 MAGNETIC DRP (DRAPES) ×2 IMPLANT
BAG DECANTER FOR FLEXI CONT (MISCELLANEOUS) ×3 IMPLANT
BANDAGE ELASTIC 4 VELCRO ST LF (GAUZE/BANDAGES/DRESSINGS) ×3 IMPLANT
BANDAGE ELASTIC 6 VELCRO ST LF (GAUZE/BANDAGES/DRESSINGS) ×3 IMPLANT
BLADE STERNUM SYSTEM 6 (BLADE) ×3 IMPLANT
BLADE SURG 11 STRL SS (BLADE) ×1 IMPLANT
BNDG GAUZE ELAST 4 BULKY (GAUZE/BANDAGES/DRESSINGS) ×3 IMPLANT
CANISTER SUCTION 2500CC (MISCELLANEOUS) ×3 IMPLANT
CARDIAC SUCTION (MISCELLANEOUS) ×3 IMPLANT
CATH CPB KIT GERHARDT (MISCELLANEOUS) ×3 IMPLANT
CATH IV 16 GAUGE 2 1/4 (CATHETERS) ×1 IMPLANT
CATH THORACIC 28FR (CATHETERS) ×3 IMPLANT
CLIP RETRACTION 3.0MM CORONARY (MISCELLANEOUS) ×1 IMPLANT
COUNTER NEEDLE 20 DBL MAG RED (NEEDLE) ×1 IMPLANT
COVER SURGICAL LIGHT HANDLE (MISCELLANEOUS) ×3 IMPLANT
CRADLE DONUT ADULT HEAD (MISCELLANEOUS) ×3 IMPLANT
DERMABOND ADVANCED (GAUZE/BANDAGES/DRESSINGS) ×1
DERMABOND ADVANCED .7 DNX12 (GAUZE/BANDAGES/DRESSINGS) IMPLANT
DEVICE PMI PUNCTURE CLOSURE (MISCELLANEOUS) ×1 IMPLANT
DRAIN CHANNEL 28F RND 3/8 FF (WOUND CARE) ×3 IMPLANT
DRAPE CARDIOVASCULAR INCISE (DRAPES) ×3
DRAPE SLUSH/WARMER DISC (DRAPES) ×3 IMPLANT
DRAPE SRG 135X102X78XABS (DRAPES) ×2 IMPLANT
DRSG AQUACEL AG ADV 3.5X14 (GAUZE/BANDAGES/DRESSINGS) ×3 IMPLANT
ELECT BLADE 4.0 EZ CLEAN MEGAD (MISCELLANEOUS) ×3
ELECT REM PT RETURN 9FT ADLT (ELECTROSURGICAL) ×6
ELECTRODE BLDE 4.0 EZ CLN MEGD (MISCELLANEOUS) ×2 IMPLANT
ELECTRODE REM PT RTRN 9FT ADLT (ELECTROSURGICAL) ×4 IMPLANT
GAUZE SPONGE 4X4 12PLY STRL (GAUZE/BANDAGES/DRESSINGS) ×6 IMPLANT
GLOVE BIO SURGEON STRL SZ 6.5 (GLOVE) ×12 IMPLANT
GLOVE BIOGEL PI IND STRL 6 (GLOVE) IMPLANT
GLOVE BIOGEL PI IND STRL 6.5 (GLOVE) IMPLANT
GLOVE BIOGEL PI IND STRL 7.0 (GLOVE) IMPLANT
GLOVE BIOGEL PI INDICATOR 6 (GLOVE) ×5
GLOVE BIOGEL PI INDICATOR 6.5 (GLOVE) ×3
GLOVE BIOGEL PI INDICATOR 7.0 (GLOVE) ×5
GOWN STRL REUS W/ TWL LRG LVL3 (GOWN DISPOSABLE) ×8 IMPLANT
GOWN STRL REUS W/TWL LRG LVL3 (GOWN DISPOSABLE) ×30
HEMOSTAT POWDER SURGIFOAM 1G (HEMOSTASIS) ×9 IMPLANT
HEMOSTAT SURGICEL 2X14 (HEMOSTASIS) ×3 IMPLANT
KIT BASIN OR (CUSTOM PROCEDURE TRAY) ×3 IMPLANT
KIT ROOM TURNOVER OR (KITS) ×3 IMPLANT
KIT SUCTION CATH 14FR (SUCTIONS) ×6 IMPLANT
KIT VASOVIEW W/TROCAR VH 2000 (KITS) ×3 IMPLANT
LEAD PACING MYOCARDI (MISCELLANEOUS) ×5 IMPLANT
MARKER GRAFT CORONARY BYPASS (MISCELLANEOUS) ×9 IMPLANT
NS IRRIG 1000ML POUR BTL (IV SOLUTION) ×16 IMPLANT
PACK OPEN HEART (CUSTOM PROCEDURE TRAY) ×3 IMPLANT
PAD ARMBOARD 7.5X6 YLW CONV (MISCELLANEOUS) ×6 IMPLANT
PAD ELECT DEFIB RADIOL ZOLL (MISCELLANEOUS) ×3 IMPLANT
PENCIL BUTTON HOLSTER BLD 10FT (ELECTRODE) ×3 IMPLANT
PUNCH AORTIC ROTATE 4.5MM 8IN (MISCELLANEOUS) ×1 IMPLANT
SET CARDIOPLEGIA MPS 5001102 (MISCELLANEOUS) ×1 IMPLANT
SPONGE GAUZE 4X4 12PLY STER LF (GAUZE/BANDAGES/DRESSINGS) ×2 IMPLANT
SPONGE LAP 18X18 X RAY DECT (DISPOSABLE) ×1 IMPLANT
SUT BONE WAX W31G (SUTURE) ×3 IMPLANT
SUT MNCRL AB 4-0 PS2 18 (SUTURE) ×1 IMPLANT
SUT PROLENE 3 0 SH1 36 (SUTURE) ×4 IMPLANT
SUT PROLENE 4 0 TF (SUTURE) ×6 IMPLANT
SUT PROLENE 6 0 CC (SUTURE) ×6 IMPLANT
SUT PROLENE 7 0 BV1 MDA (SUTURE) ×4 IMPLANT
SUT PROLENE 7.0 RB 3 (SUTURE) ×3 IMPLANT
SUT PROLENE 8 0 BV175 6 (SUTURE) ×2 IMPLANT
SUT PROLENE POLY MONO (SUTURE) ×2 IMPLANT
SUT STEEL 6MS V (SUTURE) ×3 IMPLANT
SUT STEEL SZ 6 DBL 3X14 BALL (SUTURE) ×3 IMPLANT
SUT VIC AB 1 CTX 18 (SUTURE) ×6 IMPLANT
SUT VIC AB 2-0 CT1 27 (SUTURE) ×3
SUT VIC AB 2-0 CT1 TAPERPNT 27 (SUTURE) IMPLANT
SUTURE E-PAK OPEN HEART (SUTURE) ×3 IMPLANT
SYSTEM SAHARA CHEST DRAIN ATS (WOUND CARE) ×3 IMPLANT
TAPE CLOTH SURG 4X10 WHT LF (GAUZE/BANDAGES/DRESSINGS) ×1 IMPLANT
TOWEL OR 17X24 6PK STRL BLUE (TOWEL DISPOSABLE) ×6 IMPLANT
TOWEL OR 17X26 10 PK STRL BLUE (TOWEL DISPOSABLE) ×6 IMPLANT
TRAY FOLEY IC TEMP SENS 16FR (CATHETERS) ×3 IMPLANT
TUBE FEEDING 8FR 16IN STR KANG (MISCELLANEOUS) ×3 IMPLANT
TUBING INSUFFLATION (TUBING) ×3 IMPLANT
UNDERPAD 30X30 INCONTINENT (UNDERPADS AND DIAPERS) ×3 IMPLANT
WATER STERILE IRR 1000ML POUR (IV SOLUTION) ×6 IMPLANT

## 2014-03-21 NOTE — Anesthesia Postprocedure Evaluation (Signed)
  Anesthesia Post-op Note  Patient: Robert Boyle  Procedure(s) Performed: Procedure(s): CORONARY ARTERY BYPASS GRAFTING (CABG) TIMES FIVE USING LEFT INTERNAL MAMMARY TO LAD, SAPHENOUS VEIN GRAFT TO THE PD/PL, SAPHENOUS VEIN GRAFT TO DIAGONAL 1 AND 2 SEQUENTIALLY, SAPHENOUS VEIN HARVESTED ENDOSCOPICALLY (N/A) TRANSESOPHAGEAL ECHOCARDIOGRAM (TEE) (N/A)  Patient Location: SICU  Anesthesia Type:MAC  Level of Consciousness: sedated, unresponsive and Patient remains intubated per anesthesia plan  Airway and Oxygen Therapy: Patient remains intubated per anesthesia plan and Patient placed on Ventilator (see vital sign flow sheet for setting)  Post-op Pain: none  Post-op Assessment: Post-op Vital signs reviewed, Patient's Cardiovascular Status Stable and Respiratory Function Stable  Post-op Vital Signs: stable  Last Vitals:  Filed Vitals:   03/21/14 0534  BP:   Pulse: 84  Temp:   Resp:     Complications: No apparent anesthesia complications

## 2014-03-21 NOTE — Progress Notes (Signed)
Patient has CIWA assessments every 6 hours, patient is currently intubated s/p CABG x 5 and is on Precedex drip at 0.41mcg, therefore CIWA cannot be assessed.  Patient is awake, does follow commands, and did nod yes to being in pain, Morphine 2mg  IV was given at 1755.  Will monitor patient.    Side Note: post extubation, CIWA will be assessed and followed.

## 2014-03-21 NOTE — Anesthesia Procedure Notes (Signed)
Anesthesia Procedure Note PA catheter:  Routine monitors. Timeout, sterile prep, drape, FBP R neck.  Trendelenburg position.  1% Lido local, finder and trocar RIJ 1st pass with US guidance.  Cordis placed over J wire. PA catheter in easily.  Sterile dressing applied.  Patient tolerated well, VSS.  Jenita Seashore, MD [phone]

## 2014-03-21 NOTE — Progress Notes (Signed)
Patient dentures and cell phone and other belongings will be given to wife.

## 2014-03-21 NOTE — Brief Op Note (Addendum)
      [address]       [place] [address]             [phone]       03/21/2014  1:36 PM  PATIENT:  Robert Boyle  78 y.o. male  PRE-OPERATIVE DIAGNOSIS:  Coronary Artery Disease  POST-OPERATIVE DIAGNOSIS:  Coronary Artery Disease  PROCEDURE:  TRANSESOPHAGEAL ECHOCARDIOGRAM (TEE), MEDIAN STERNOTOMY for CORONARY ARTERY BYPASS GRAFTING (CABG) TIMES FIVE USING LEFT INTERNAL MAMMARY TO LAD, SAPHENOUS VEIN GRAFT to DIAGONAL 1 and DIAGONAL 2, SAPHENOUS VEIN GRAFT SEQUENTIALLY to PDA and PLB with  VEIN HARVESTED ENDOSCOPICALLY from the RIGHT THIGH and LOWER LEG  SURGEON:  Surgeon(s) and Role:    * Grace Isaac, MD - Primary  PHYSICIAN ASSISTANT: Lars Pinks PA-C  ANESTHESIA:   general  EBL:  Total I/O In: 250 [IV Piggyback:250] Out: 2297 [Urine:1075; Blood:1222]   DRAINS: Chest tubes placed in the mediastinal and pleural spaces   COUNTS CORRECT:  YES  DICTATION: .Dragon Dictation  PLAN OF CARE: Admit to inpatient   PATIENT DISPOSITION:  ICU - intubated and hemodynamically stable.   Delay start of Pharmacological VTE agent (>24hrs) due to surgical blood loss or risk of bleeding: yes  BASELINE WEIGHT: 95 kg

## 2014-03-21 NOTE — Progress Notes (Signed)
PatientMRSA pcr surg. Screening back and was positive for Staphylococcus Aureous.

## 2014-03-21 NOTE — Transfer of Care (Signed)
Immediate Anesthesia Transfer of Care Note  Patient: Robert Boyle  Procedure(s) Performed: Procedure(s): CORONARY ARTERY BYPASS GRAFTING (CABG) TIMES FIVE USING LEFT INTERNAL MAMMARY TO LAD, SAPHENOUS VEIN GRAFT TO THE PD/PL, SAPHENOUS VEIN GRAFT TO DIAGONAL 1 AND 2 SEQUENTIALLY, SAPHENOUS VEIN HARVESTED ENDOSCOPICALLY (N/A) TRANSESOPHAGEAL ECHOCARDIOGRAM (TEE) (N/A)  Patient Location: SICU  Anesthesia Type:General  Level of Consciousness: Patient remains intubated per anesthesia plan  Airway & Oxygen Therapy: Patient remains intubated per anesthesia plan and Patient placed on Ventilator (see vital sign flow sheet for setting)  Post-op Assessment: Report given to PACU RN and Post -op Vital signs reviewed and stable  Post vital signs: Reviewed and stable  Complications: No apparent anesthesia complications

## 2014-03-21 NOTE — OR Nursing (Signed)
Dr Servando Snare started at [phone]

## 2014-03-21 NOTE — Procedures (Signed)
Extubation Procedure Note  Patient Details:   Name: Robert Boyle DOB: February 27, 1936 MRN: [id number]   Airway Documentation:     Evaluation  O2 sats: stable throughout Complications: No apparent complications Patient did tolerate procedure well. Bilateral Breath Sounds: Rhonchi   Yes  FVC-535ml NIF-32 ABG results and parameters resulted to Dr. Jaclyn Shaggy, Jones Skene 03/21/2014, 6:49 PM

## 2014-03-21 NOTE — Progress Notes (Signed)
Utilization Review Completed.Donne Anon T12/05/2013

## 2014-03-21 NOTE — Progress Notes (Signed)
Patient ID: Robert Boyle, male   DOB: 1935-08-09, 78 y.o.   MRN: [id number] EVENING ROUNDS NOTE :     [address] [place]       [place] [address]             [phone]                 Day of Surgery Procedure(s) (LRB): CORONARY ARTERY BYPASS GRAFTING (CABG) TIMES FIVE USING LEFT INTERNAL MAMMARY TO LAD, SAPHENOUS VEIN GRAFT TO THE PD/PL, SAPHENOUS VEIN GRAFT TO DIAGONAL 1 AND 2 SEQUENTIALLY, SAPHENOUS VEIN HARVESTED ENDOSCOPICALLY (N/A) TRANSESOPHAGEAL ECHOCARDIOGRAM (TEE) (N/A)  Total Length of Stay:  LOS: 1 day  BP 108/50 mmHg  Pulse 79  Temp(Src) 99.5 F (37.5 C) (Core (Comment))  Resp 28  Ht 6\' 1"  (1.854 m)  Wt 210 lb 8.6 oz (95.5 kg)  BMI 27.78 kg/m2  SpO2 96%  .Intake/Output      12/02 0701 - 12/03 0700   P.O.    I.V. (mL/kg) 1916.9 (20.1)   Blood 422   IV Piggyback 1350   Total Intake(mL/kg) 3688.9 (38.6)   Urine (mL/kg/hr) 2080 (1.8)   Blood 1222 (1)   Chest Tube 150 (0.1)   Total Output 3452   Net +236.9         . sodium chloride 20 mL/hr at 03/21/14 1900  . [START ON 03/22/2014] sodium chloride    . sodium chloride 10 mL/hr at 03/21/14 1900  . dexmedetomidine Stopped (03/21/14 1900)  . insulin (NOVOLIN-R) infusion Stopped (03/21/14 1630)  . lactated ringers 10 mL/hr at 03/21/14 1900  . nitroGLYCERIN Stopped (03/21/14 1745)  . phenylephrine (NEO-SYNEPHRINE) Adult infusion Stopped (03/21/14 1800)     Lab Results  Component Value Date   WBC 5.7 03/21/2014   HGB 10.7* 03/21/2014   HCT 32.0* 03/21/2014   PLT 71* 03/21/2014   GLUCOSE 105* 03/21/2014   ALT 34 03/20/2014   AST 29 03/20/2014   NA 136* 03/21/2014   K 3.7 03/21/2014   CL 97 03/21/2014   CREATININE 0.80 03/21/2014   BUN 14 03/21/2014   CO2 21 03/20/2014   TSH 4.683* 03/12/2014   INR 1.32 03/21/2014   HGBA1C 5.6 03/21/2014   Extubated, neuro intact Not bleeding   Grace Isaac MD  Beeper [phone] Office [phone] 03/21/2014 7:26 PM

## 2014-03-21 NOTE — Anesthesia Preprocedure Evaluation (Signed)
Anesthesia Evaluation  Patient identified by MRN, date of birth, ID band Patient awake    Reviewed: Allergy & Precautions, H&P , NPO status   Airway    Neck ROM: Full    Dental   Pulmonary shortness of breath, former smoker,  breath sounds clear to auscultation        Cardiovascular hypertension, + angina + CAD Rhythm:Regular Rate:Normal     Neuro/Psych    GI/Hepatic GERD-  ,  Endo/Other    Renal/GU      Musculoskeletal   Abdominal   Peds  Hematology   Anesthesia Other Findings   Reproductive/Obstetrics                             Anesthesia Physical Anesthesia Plan  ASA: III  Anesthesia Plan: General   Post-op Pain Management:    Induction: Intravenous  Airway Management Planned: Oral ETT  Additional Equipment: Arterial line, PA Cath and TEE  Intra-op Plan:   Post-operative Plan: Post-operative intubation/ventilation  Informed Consent: I have reviewed the patients History and Physical, chart, labs and discussed the procedure including the risks, benefits and alternatives for the proposed anesthesia with the patient or authorized representative who has indicated his/her understanding and acceptance.   Dental advisory given  Plan Discussed with: CRNA and Surgeon  Anesthesia Plan Comments:         Anesthesia Quick Evaluation

## 2014-03-21 NOTE — Progress Notes (Signed)
  Echocardiogram Echocardiogram Transesophageal has been performed.  Robert Boyle 03/21/2014, 9:18 AM

## 2014-03-22 ENCOUNTER — Inpatient Hospital Stay (HOSPITAL_COMMUNITY): Payer: Medicare HMO

## 2014-03-22 ENCOUNTER — Encounter (HOSPITAL_COMMUNITY): Payer: Self-pay | Admitting: Cardiothoracic Surgery

## 2014-03-22 DIAGNOSIS — I208 Other forms of angina pectoris: Secondary | ICD-10-CM

## 2014-03-22 LAB — BASIC METABOLIC PANEL
Anion gap: 12 (ref 5–15)
BUN: 15 mg/dL (ref 6–23)
CO2: 21 mEq/L (ref 19–32)
CREATININE: 0.98 mg/dL (ref 0.50–1.35)
Calcium: 8.3 mg/dL — ABNORMAL LOW (ref 8.4–10.5)
Chloride: 104 mEq/L (ref 96–112)
GFR calc Af Amer: >90 mL/min (ref >90)
GFR calc non Af Amer: 78 mL/min — ABNORMAL LOW (ref >90)
Glucose, Bld: 115 mg/dL — ABNORMAL HIGH (ref 70–99)
Potassium: 4.3 mEq/L (ref 3.7–5.3)
Sodium: 137 mEq/L (ref 137–147)

## 2014-03-22 LAB — GLUCOSE, CAPILLARY
GLUCOSE-CAPILLARY: 99 mg/dL (ref 70–99)
GLUCOSE-CAPILLARY: 102 mg/dL — AB (ref 70–99)
Glucose-Capillary: 107 mg/dL — ABNORMAL HIGH (ref 70–99)
Glucose-Capillary: 116 mg/dL — ABNORMAL HIGH (ref 70–99)
Glucose-Capillary: 117 mg/dL — ABNORMAL HIGH (ref 70–99)
Glucose-Capillary: 117 mg/dL — ABNORMAL HIGH (ref 70–99)
Glucose-Capillary: 128 mg/dL — ABNORMAL HIGH (ref 70–99)

## 2014-03-22 LAB — POCT I-STAT, CHEM 8
BUN: 17 mg/dL (ref 6–23)
CALCIUM ION: 1.15 mmol/L (ref 1.13–1.30)
Chloride: 105 mEq/L (ref 96–112)
Creatinine, Ser: 1.2 mg/dL (ref 0.50–1.35)
GLUCOSE: 150 mg/dL — AB (ref 70–99)
HCT: 32 % — ABNORMAL LOW (ref 39.0–52.0)
HEMOGLOBIN: 10.9 g/dL — AB (ref 13.0–17.0)
Potassium: 3.6 mEq/L — ABNORMAL LOW (ref 3.7–5.3)
Sodium: 134 mEq/L — ABNORMAL LOW (ref 137–147)
TCO2: 20 mmol/L (ref 0–100)

## 2014-03-22 LAB — MAGNESIUM
Magnesium: 2.5 mg/dL (ref 1.5–2.5)
Magnesium: 2.3 mg/dL (ref 1.5–2.5)

## 2014-03-22 LAB — CBC
HCT: 32.3 % — ABNORMAL LOW (ref 39.0–52.0)
HEMATOCRIT: 29.9 % — AB (ref 39.0–52.0)
HEMOGLOBIN: 10.7 g/dL — AB (ref 13.0–17.0)
Hemoglobin: 9.8 g/dL — ABNORMAL LOW (ref 13.0–17.0)
MCH: 31.2 pg (ref 26.0–34.0)
MCH: 31.4 pg (ref 26.0–34.0)
MCHC: 32.8 g/dL (ref 30.0–36.0)
MCHC: 33.1 g/dL (ref 30.0–36.0)
MCV: 95.2 fL (ref 78.0–100.0)
MCV: 94.7 fL (ref 78.0–100.0)
PLATELETS: 77 10*3/uL — AB (ref 150–400)
Platelets: 65 10*3/uL — ABNORMAL LOW (ref 150–400)
RBC: 3.14 MIL/uL — ABNORMAL LOW (ref 4.22–5.81)
RBC: 3.41 MIL/uL — ABNORMAL LOW (ref 4.22–5.81)
RDW: 14.7 % (ref 11.5–15.5)
RDW: 14.6 % (ref 11.5–15.5)
WBC: 5.3 10*3/uL (ref 4.0–10.5)
WBC: 6.7 10*3/uL (ref 4.0–10.5)

## 2014-03-22 LAB — CREATININE, SERUM
Creatinine, Ser: 1.18 mg/dL (ref 0.50–1.35)
GFR calc Af Amer: 67 mL/min — ABNORMAL LOW (ref >90)
GFR calc non Af Amer: 58 mL/min — ABNORMAL LOW (ref >90)

## 2014-03-22 MED ORDER — SODIUM CHLORIDE 0.9 % IV SOLN
INTRAVENOUS | Status: DC
  Administered 2014-03-22: 10 mL/h via INTRAVENOUS

## 2014-03-22 MED ORDER — INSULIN ASPART 100 UNIT/ML ~~LOC~~ SOLN
0.0000 [IU] | SUBCUTANEOUS | Status: DC
Start: 2014-03-22 — End: 2014-03-23
  Administered 2014-03-22: 2 [IU] via SUBCUTANEOUS

## 2014-03-22 MED ORDER — POTASSIUM CHLORIDE ER 10 MEQ PO TBCR
20.0000 meq | EXTENDED_RELEASE_TABLET | Freq: Once | ORAL | Status: AC
  Administered 2014-03-22: 20 meq via ORAL
  Filled 2014-03-22 (×2): qty 2

## 2014-03-22 MED ORDER — AMLODIPINE BESYLATE 5 MG PO TABS
5.0000 mg | ORAL_TABLET | Freq: Every day | ORAL | Status: DC
  Administered 2014-03-22 – 2014-03-27 (×6): 5 mg via ORAL
  Filled 2014-03-22 (×6): qty 1

## 2014-03-22 MED ORDER — FUROSEMIDE 10 MG/ML IJ SOLN
40.0000 mg | Freq: Once | INTRAMUSCULAR | Status: AC
  Administered 2014-03-22: 40 mg via INTRAVENOUS
  Filled 2014-03-22: qty 4

## 2014-03-22 MED FILL — Mannitol IV Soln 20%: INTRAVENOUS | Qty: 500 | Status: AC

## 2014-03-22 MED FILL — Electrolyte-R (PH 7.4) Solution: INTRAVENOUS | Qty: 4000 | Status: AC

## 2014-03-22 MED FILL — Sodium Chloride IV Soln 0.9%: INTRAVENOUS | Qty: 2000 | Status: AC

## 2014-03-22 MED FILL — Sodium Bicarbonate IV Soln 8.4%: INTRAVENOUS | Qty: 50 | Status: AC

## 2014-03-22 MED FILL — Heparin Sodium (Porcine) Inj 1000 Unit/ML: INTRAMUSCULAR | Qty: 10 | Status: AC

## 2014-03-22 MED FILL — Lidocaine HCl IV Inj 20 MG/ML: INTRAVENOUS | Qty: 5 | Status: AC

## 2014-03-22 NOTE — Progress Notes (Signed)
CT surgery p .p.m. Rounds Patient examined and record reviewed.Hemodynamics stable,labs satisfactory.Patient had stable day.Continue current care. VAN TRIGT III,PETER 03/22/2014

## 2014-03-22 NOTE — Progress Notes (Signed)
Patient ID: Robert Boyle, male   DOB: [birth year], 78 y.o.   MRN: [id number] TCTS DAILY ICU PROGRESS NOTE                   [address]            [place] [address]          [phone]   1 Day Post-Op Procedure(s) (LRB): CORONARY ARTERY BYPASS GRAFTING (CABG) TIMES FIVE USING LEFT INTERNAL MAMMARY TO LAD, SAPHENOUS VEIN GRAFT TO THE PD/PL, SAPHENOUS VEIN GRAFT TO DIAGONAL 1 AND 2 SEQUENTIALLY, SAPHENOUS VEIN HARVESTED ENDOSCOPICALLY (N/A) TRANSESOPHAGEAL ECHOCARDIOGRAM (TEE) (N/A)  Total Length of Stay:  LOS: 2 days   Subjective: Awake and alert neuro intact, no confusion  Objective: Vital signs in last 24 hours: Temp:  [97.7 F (36.5 C)-100.9 F (38.3 C)] 100 F (37.8 C) (12/03 0700) Pulse Rate:  [54-97] 95 (12/03 0700) Cardiac Rhythm:  [-] Normal sinus rhythm (12/03 0741) Resp:  [6-29] 14 (12/03 0700) BP: (80-150)/(48-68) 117/57 mmHg (12/03 0700) SpO2:  [94 %-98 %] 95 % (12/03 0700) Arterial Line BP: (86-168)/(43-63) 131/48 mmHg (12/03 0700) FiO2 (%):  [40 %-50 %] 40 % (12/02 1830) Weight:  [210 lb 8.6 oz (95.5 kg)-220 lb 8 oz (100.018 kg)] 220 lb 8 oz (100.018 kg) (12/03 0500)  Filed Weights   03/20/14 1154 03/21/14 1500 03/22/14 0500  Weight: 210 lb 8.6 oz (95.5 kg) 210 lb 8.6 oz (95.5 kg) 220 lb 8 oz (100.018 kg)    Weight change: 0 lb (0 kg)   Hemodynamic parameters for last 24 hours: PAP: (24-38)/(9-23) 27/17 mmHg CVP:  [11 mmHg-15 mmHg] 11 mmHg CO:  [3.8 L/min-7.4 L/min] 7.4 L/min CI:  [1.7 L/min/m2-3.4 L/min/m2] 3.4 L/min/m2  Intake/Output from previous day: 12/02 0701 - 12/03 0700 In: 4380.6 [I.V.:2358.6; Blood:422; IV Piggyback:1600] Out: 6389 [Urine:2815; Blood:1222; Chest Tube:300]  Intake/Output this shift:    Current Meds: Scheduled Meds: . acetaminophen  1,000 mg Oral 4 times per day   Or  . acetaminophen (TYLENOL) oral liquid 160 mg/5 mL  1,000 mg Per Tube 4 times per day  . antiseptic oral rinse  7 mL Mouth Rinse BID    . aspirin EC  325 mg Oral Daily   Or  . aspirin  324 mg Per Tube Daily  . atorvastatin  80 mg Oral Daily  . bisacodyl  10 mg Oral Daily   Or  . bisacodyl  10 mg Rectal Daily  . cefUROXime (ZINACEF)  IV  1.5 g Intravenous Q12H  . docusate sodium  200 mg Oral Daily  . famotidine (PEPCID) IV  20 mg Intravenous Q12H  . folic acid  1 mg Oral Daily  . insulin aspart  0-24 Units Subcutaneous 6 times per day  . insulin regular  0-10 Units Intravenous TID WC  . metoprolol tartrate  12.5 mg Oral BID   Or  . metoprolol tartrate  12.5 mg Per Tube BID  . multivitamin with minerals  1 tablet Oral Daily  . mupirocin ointment  1 application Nasal BID  . [START ON 03/23/2014] pantoprazole  40 mg Oral Daily  . sodium chloride  3 mL Intravenous Q12H  . thiamine  100 mg Oral Daily   Or  . thiamine  100 mg Intravenous Daily   Continuous Infusions: . sodium chloride 20 mL/hr at 03/21/14 1900  . sodium chloride    . sodium chloride 10 mL/hr at 03/21/14 1900  . dexmedetomidine Stopped (03/22/14  0200)  . insulin (NOVOLIN-R) infusion Stopped (03/21/14 1630)  . lactated ringers 10 mL/hr at 03/21/14 1900  . nitroGLYCERIN 10 mcg/min (03/22/14 0700)  . phenylephrine (NEO-SYNEPHRINE) Adult infusion Stopped (03/21/14 1800)   PRN Meds:.albumin human, LORazepam **OR** LORazepam, metoprolol, midazolam, morphine injection, ondansetron (ZOFRAN) IV, oxyCODONE, sodium chloride, traMADol  General appearance: alert and cooperative Neurologic: intact Heart: regular rate and rhythm, S1, S2 normal, no murmur, click, rub or gallop Lungs: diminished breath sounds bibasilar Abdomen: soft, non-tender; bowel sounds normal; no masses,  no organomegaly Extremities: extremities normal, atraumatic, no cyanosis or edema and Homans sign is negative, no sign of DVT Wound: sternum intact  Lab Results: CBC: Recent Labs  03/21/14 2010 03/21/14 2029 03/22/14 0400  WBC 5.0  --  5.3  HGB 10.3* 10.5* 9.8*  HCT 30.6* 31.0*  29.9*  PLT 69*  --  65*   BMET:  Recent Labs  03/20/14 1850  03/21/14 2029 03/22/14 0400  NA 141  < > 138 137  K 3.6*  < > 4.3 4.3  CL 105  < > 105 104  CO2 21  --   --  21  GLUCOSE 107*  < > 134* 115*  BUN 25*  < > 13 15  CREATININE 1.05  < > 0.90 0.98  CALCIUM 9.0  --   --  8.3*  < > = values in this interval not displayed.  PT/INR:  Recent Labs  03/21/14 1430  LABPROT 16.5*  INR 1.32   Radiology: Dg Chest Port 1 View  03/21/2014   CLINICAL DATA:  Postop for CABG.  Evaluate support apparatus.  EXAM: PORTABLE CHEST - 1 VIEW  COMPARISON:  One day prior  FINDINGS: Endotracheal tube terminates 5.9 cm above carina. A right IJ Cordis sheath terminates at the pulmonary outflow tract. Nasogastric tube extends beyond the inferior aspect of the film. Left-sided chest tube.  Interval median sternotomy. Cardiomegaly accentuated by AP portable technique. Right hemidiaphragm elevation is moderate. Probable small layering bilateral pleural effusions. No pneumothorax. Low lung volumes with resultant pulmonary interstitial prominence. Patchy bibasilar airspace disease, new or increased.  IMPRESSION: Expected appearance after median sternotomy.  Swan-Ganz catheter with tip at pulmonary outflow tract. These results will be called to the ordering clinician or representative by the Radiologist Assistant, and communication documented in the PACS or zVision Dashboard.  Probable layering bilateral pleural effusions with bibasilar atelectasis.  No pneumothorax.   Electronically Signed   By: Abigail Miyamoto M.D.   On: 03/21/2014 14:59     Assessment/Plan: S/P Procedure(s) (LRB): CORONARY ARTERY BYPASS GRAFTING (CABG) TIMES FIVE USING LEFT INTERNAL MAMMARY TO LAD, SAPHENOUS VEIN GRAFT TO THE PD/PL, SAPHENOUS VEIN GRAFT TO DIAGONAL 1 AND 2 SEQUENTIALLY, SAPHENOUS VEIN HARVESTED ENDOSCOPICALLY (N/A) TRANSESOPHAGEAL ECHOCARDIOGRAM (TEE) (N/A) Mobilize Diuresis d/c tubes/lines Continue foley due to strict I&O  and urinary output monitoring See progression orders Expected Acute  Blood - loss Anemia pre op and post op thrombocytopenia- avoid heparin for now     [name],[name] 03/22/2014 7:54 AM

## 2014-03-23 ENCOUNTER — Inpatient Hospital Stay (HOSPITAL_COMMUNITY): Payer: Medicare HMO

## 2014-03-23 LAB — BASIC METABOLIC PANEL
Anion gap: 13 (ref 5–15)
BUN: 14 mg/dL (ref 6–23)
CO2: 22 mEq/L (ref 19–32)
Calcium: 8.8 mg/dL (ref 8.4–10.5)
Chloride: 104 mEq/L (ref 96–112)
Creatinine, Ser: 0.96 mg/dL (ref 0.50–1.35)
GFR calc Af Amer: >90 mL/min (ref >90)
GFR calc non Af Amer: 79 mL/min — ABNORMAL LOW (ref >90)
Glucose, Bld: 97 mg/dL (ref 70–99)
Potassium: 3.9 mEq/L (ref 3.7–5.3)
Sodium: 139 mEq/L (ref 137–147)

## 2014-03-23 LAB — TYPE AND SCREEN
ABO/RH(D): O POS
Antibody Screen: NEGATIVE
Unit division: 0
Unit division: 0

## 2014-03-23 LAB — CBC
HCT: 30.2 % — ABNORMAL LOW (ref 39.0–52.0)
Hemoglobin: 10 g/dL — ABNORMAL LOW (ref 13.0–17.0)
MCH: 31.3 pg (ref 26.0–34.0)
MCHC: 33.1 g/dL (ref 30.0–36.0)
MCV: 94.7 fL (ref 78.0–100.0)
Platelets: 71 10*3/uL — ABNORMAL LOW (ref 150–400)
RBC: 3.19 MIL/uL — ABNORMAL LOW (ref 4.22–5.81)
RDW: 14.3 % (ref 11.5–15.5)
WBC: 5.6 10*3/uL (ref 4.0–10.5)

## 2014-03-23 LAB — GLUCOSE, CAPILLARY
GLUCOSE-CAPILLARY: 100 mg/dL — AB (ref 70–99)
GLUCOSE-CAPILLARY: 132 mg/dL — AB (ref 70–99)
Glucose-Capillary: 98 mg/dL (ref 70–99)
Glucose-Capillary: 115 mg/dL — ABNORMAL HIGH (ref 70–99)

## 2014-03-23 MED ORDER — DOCUSATE SODIUM 100 MG PO CAPS
200.0000 mg | ORAL_CAPSULE | Freq: Every day | ORAL | Status: DC
  Administered 2014-03-24 – 2014-03-27 (×4): 200 mg via ORAL
  Filled 2014-03-23 (×5): qty 2

## 2014-03-23 MED ORDER — SODIUM CHLORIDE 0.9 % IV SOLN: 250.0000 mL | INTRAVENOUS | Status: DC | PRN

## 2014-03-23 MED ORDER — MOVING RIGHT ALONG BOOK
Freq: Once | Status: AC
  Administered 2014-03-23: 12:00:00
  Filled 2014-03-23: qty 1

## 2014-03-23 MED ORDER — METOPROLOL TARTRATE 12.5 MG HALF TABLET: 12.5000 mg | ORAL_TABLET | Freq: Two times a day (BID) | ORAL | Status: DC

## 2014-03-23 MED ORDER — TRAMADOL HCL 50 MG PO TABS: 50.0000 mg | ORAL_TABLET | ORAL | Status: DC | PRN

## 2014-03-23 MED ORDER — POTASSIUM CHLORIDE CRYS ER 20 MEQ PO TBCR
EXTENDED_RELEASE_TABLET | ORAL | Status: AC
  Filled 2014-03-23: qty 1

## 2014-03-23 MED ORDER — FUROSEMIDE 40 MG PO TABS
ORAL_TABLET | ORAL | Status: AC
Start: 2014-03-23 — End: 2014-03-24
  Filled 2014-03-23: qty 1

## 2014-03-23 MED ORDER — BISACODYL 5 MG PO TBEC
10.0000 mg | DELAYED_RELEASE_TABLET | Freq: Every day | ORAL | Status: DC | PRN
  Administered 2014-03-25: 10 mg via ORAL
  Filled 2014-03-23: qty 2

## 2014-03-23 MED ORDER — INSULIN ASPART 100 UNIT/ML ~~LOC~~ SOLN
0.0000 [IU] | Freq: Three times a day (TID) | SUBCUTANEOUS | Status: DC
  Administered 2014-03-23 – 2014-03-25 (×2): 2 [IU] via SUBCUTANEOUS

## 2014-03-23 MED ORDER — SODIUM CHLORIDE 0.9 % IJ SOLN: 3.0000 mL | INTRAMUSCULAR | Status: DC | PRN

## 2014-03-23 MED ORDER — ONDANSETRON HCL 4 MG/2ML IJ SOLN: 4.0000 mg | Freq: Four times a day (QID) | INTRAMUSCULAR | Status: DC | PRN

## 2014-03-23 MED ORDER — ALLOPURINOL 300 MG PO TABS
300.0000 mg | ORAL_TABLET | Freq: Every day | ORAL | Status: DC
  Administered 2014-03-23 – 2014-03-27 (×5): 300 mg via ORAL
  Filled 2014-03-23 (×5): qty 1

## 2014-03-23 MED ORDER — FUROSEMIDE 40 MG PO TABS
40.0000 mg | ORAL_TABLET | Freq: Every day | ORAL | Status: AC
  Administered 2014-03-23 – 2014-03-25 (×3): 40 mg via ORAL
  Filled 2014-03-23 (×2): qty 1

## 2014-03-23 MED ORDER — SODIUM CHLORIDE 0.9 % IJ SOLN
3.0000 mL | Freq: Two times a day (BID) | INTRAMUSCULAR | Status: DC
  Administered 2014-03-23 – 2014-03-26 (×6): 3 mL via INTRAVENOUS

## 2014-03-23 MED ORDER — OXYCODONE HCL 5 MG PO TABS: 5.0000 mg | ORAL_TABLET | ORAL | Status: DC | PRN

## 2014-03-23 MED ORDER — PANTOPRAZOLE SODIUM 40 MG PO TBEC: 40.0000 mg | DELAYED_RELEASE_TABLET | Freq: Every day | ORAL | Status: DC

## 2014-03-23 MED ORDER — BISACODYL 10 MG RE SUPP
10.0000 mg | Freq: Every day | RECTAL | Status: DC | PRN
Start: 2014-03-23 — End: 2014-03-27

## 2014-03-23 MED ORDER — ONDANSETRON HCL 4 MG PO TABS: 4.0000 mg | ORAL_TABLET | Freq: Four times a day (QID) | ORAL | Status: DC | PRN

## 2014-03-23 MED ORDER — ASPIRIN EC 325 MG PO TBEC
325.0000 mg | DELAYED_RELEASE_TABLET | Freq: Every day | ORAL | Status: DC
  Administered 2014-03-24 – 2014-03-27 (×4): 325 mg via ORAL
  Filled 2014-03-23 (×5): qty 1

## 2014-03-23 MED ORDER — POTASSIUM CHLORIDE CRYS ER 20 MEQ PO TBCR
20.0000 meq | EXTENDED_RELEASE_TABLET | Freq: Every day | ORAL | Status: AC
  Administered 2014-03-23 – 2014-03-25 (×3): 20 meq via ORAL
  Filled 2014-03-23 (×2): qty 1

## 2014-03-23 MED FILL — Heparin Sodium (Porcine) Inj 1000 Unit/ML: INTRAMUSCULAR | Qty: 30 | Status: AC

## 2014-03-23 MED FILL — Magnesium Sulfate Inj 50%: INTRAMUSCULAR | Qty: 10 | Status: AC

## 2014-03-23 MED FILL — Potassium Chloride Inj 2 mEq/ML: INTRAVENOUS | Qty: 40 | Status: AC

## 2014-03-23 NOTE — Progress Notes (Signed)
Patient ID: Robert Boyle, male   DOB: [birth year], 78 y.o.   MRN: [id number] TCTS DAILY ICU PROGRESS NOTE                   [address]            [place] [address]          [phone]   2 Days Post-Op Procedure(s) (LRB): CORONARY ARTERY BYPASS GRAFTING (CABG) TIMES FIVE USING LEFT INTERNAL MAMMARY TO LAD, SAPHENOUS VEIN GRAFT TO THE PD/PL, SAPHENOUS VEIN GRAFT TO DIAGONAL 1 AND 2 SEQUENTIALLY, SAPHENOUS VEIN HARVESTED ENDOSCOPICALLY (N/A) TRANSESOPHAGEAL ECHOCARDIOGRAM (TEE) (N/A)  Total Length of Stay:  LOS: 3 days   Subjective: Walked part way around the unit, neuro intact, no sign of alcohol withdrawel  Objective: Vital signs in last 24 hours: Temp:  [97.8 F (36.6 C)-99.1 F (37.3 C)] 97.8 F (36.6 C) (12/04 0800) Pulse Rate:  [88-125] 125 (12/04 0800) Cardiac Rhythm:  [-] Normal sinus rhythm (12/04 0800) Resp:  [13-30] 20 (12/04 0800) BP: (113-145)/(54-66) 129/61 mmHg (12/04 0800) SpO2:  [90 %-97 %] 96 % (12/04 0800) Arterial Line BP: (116-169)/(42-71) 169/58 mmHg (12/03 1800)  Filed Weights   03/20/14 1154 03/21/14 1500 03/22/14 0500  Weight: 210 lb 8.6 oz (95.5 kg) 210 lb 8.6 oz (95.5 kg) 220 lb 8 oz (100.018 kg)    Weight change:    Hemodynamic parameters for last 24 hours:    Intake/Output from previous day: 12/03 0701 - 12/04 0700 In: 315.3 [I.V.:265.3; IV Piggyback:50] Out: 2765 [Urine:2695; Chest Tube:70]  Intake/Output this shift:    Current Meds: Scheduled Meds: . acetaminophen  1,000 mg Oral 4 times per day   Or  . acetaminophen (TYLENOL) oral liquid 160 mg/5 mL  1,000 mg Per Tube 4 times per day  . amLODipine  5 mg Oral Daily  . antiseptic oral rinse  7 mL Mouth Rinse BID  . aspirin EC  325 mg Oral Daily   Or  . aspirin  324 mg Per Tube Daily  . atorvastatin  80 mg Oral Daily  . bisacodyl  10 mg Oral Daily   Or  . bisacodyl  10 mg Rectal Daily  . cefUROXime (ZINACEF)  IV  1.5 g Intravenous Q12H  . docusate sodium   200 mg Oral Daily  . folic acid  1 mg Oral Daily  . insulin aspart  0-24 Units Subcutaneous 6 times per day  . metoprolol tartrate  12.5 mg Oral BID   Or  . metoprolol tartrate  12.5 mg Per Tube BID  . multivitamin with minerals  1 tablet Oral Daily  . mupirocin ointment  1 application Nasal BID  . pantoprazole  40 mg Oral Daily  . sodium chloride  3 mL Intravenous Q12H  . thiamine  100 mg Oral Daily   Or  . thiamine  100 mg Intravenous Daily   Continuous Infusions: . sodium chloride 10 mL/hr at 03/23/14 0600  . dexmedetomidine Stopped (03/22/14 0200)  . lactated ringers 10 mL/hr at 03/22/14 1700  . nitroGLYCERIN Stopped (03/22/14 1200)  . phenylephrine (NEO-SYNEPHRINE) Adult infusion Stopped (03/21/14 1800)   PRN Meds:.LORazepam **OR** LORazepam, metoprolol, midazolam, morphine injection, ondansetron (ZOFRAN) IV, oxyCODONE, sodium chloride, traMADol  General appearance: alert and cooperative Neurologic: intact Heart: regular rate and rhythm, S1, S2 normal, no murmur, click, rub or gallop Lungs: diminished breath sounds bibasilar Abdomen: soft, non-tender; bowel sounds normal; no masses,  no organomegaly Extremities: extremities normal, atraumatic,  no cyanosis or edema and Homans sign is negative, no sign of DVT Wound: sternum stable  Lab Results: CBC: Recent Labs  03/22/14 1700 03/22/14 1750 03/23/14 0430  WBC 6.7  --  5.6  HGB 10.7* 10.9* 10.0*  HCT 32.3* 32.0* 30.2*  PLT 77*  --  71*   BMET:  Recent Labs  03/22/14 0400  03/22/14 1750 03/23/14 0430  NA 137  --  134* 139  K 4.3  --  3.6* 3.9  CL 104  --  105 104  CO2 21  --   --  22  GLUCOSE 115*  --  150* 97  BUN 15  --  17 14  CREATININE 0.98  < > 1.20 0.96  CALCIUM 8.3*  --   --  8.8  < > = values in this interval not displayed.  PT/INR:  Recent Labs  03/21/14 1430  LABPROT 16.5*  INR 1.32   Radiology: Dg Chest Port 1 View  03/23/2014   CLINICAL DATA:  Post CABG procedure. History of coronary  artery disease.  EXAM: PORTABLE CHEST - 1 VIEW  COMPARISON:  03/22/2014  FINDINGS: Swan-Ganz catheter has been removed but the right jugular central line is still present. There is marked elevation of the right hemidiaphragm with associated volume loss in the right lung. Again noted is a left chest tube without a pneumothorax. Persistent consolidation in the retrocardiac space. Mediastinal drain has been removed.  IMPRESSION: Marked volume loss in the right lung with elevation of the right hemidiaphragm.  Persistent consolidation at the left lung base.  No evidence for a pneumothorax.   Electronically Signed   By: Markus Daft M.D.   On: 03/23/2014 08:47   Dg Chest Port 1 View  03/22/2014   CLINICAL DATA:  Status post CABG  EXAM: PORTABLE CHEST - 1 VIEW  COMPARISON:  Portable chest x-ray of March 21, 2014  FINDINGS: The trachea and esophagus have been extubated. There is persistent volume loss on the right with elevation of the hemidiaphragm. There is no pneumothorax on the right. On the left the lung is better inflated. A chest tube is present overlying the lateral aspect of the fifth rib. There is no pneumothorax or significant pleural effusion. The retrocardiac region on the left remains dense. The cardiac silhouette where visualized is mildly enlarged. A Swan-Ganz catheter tip lies in the region of the proximal main pulmonary artery. There are 7 intact sternal wires. A mediastinal drain is present with the tip overlying the inferior aspect of the T4 vertebral body.  IMPRESSION: Stable post CABG changes without evidence of a pneumothorax. The trachea is been extubated. The left-sided chest tube and the mediastinal drain are unchanged.   Electronically Signed   By: David  Martinique   On: 03/22/2014 08:12   Dg Chest Port 1 View  03/21/2014   CLINICAL DATA:  Postop for CABG.  Evaluate support apparatus.  EXAM: PORTABLE CHEST - 1 VIEW  COMPARISON:  One day prior  FINDINGS: Endotracheal tube terminates 5.9 cm above  carina. A right IJ Cordis sheath terminates at the pulmonary outflow tract. Nasogastric tube extends beyond the inferior aspect of the film. Left-sided chest tube.  Interval median sternotomy. Cardiomegaly accentuated by AP portable technique. Right hemidiaphragm elevation is moderate. Probable small layering bilateral pleural effusions. No pneumothorax. Low lung volumes with resultant pulmonary interstitial prominence. Patchy bibasilar airspace disease, new or increased.  IMPRESSION: Expected appearance after median sternotomy.  Swan-Ganz catheter with tip at pulmonary outflow tract.  These results will be called to the ordering clinician or representative by the Radiologist Assistant, and communication documented in the PACS or zVision Dashboard.  Probable layering bilateral pleural effusions with bibasilar atelectasis.  No pneumothorax.   Electronically Signed   By: Abigail Miyamoto M.D.   On: 03/21/2014 14:59     Assessment/Plan: S/P Procedure(s) (LRB): CORONARY ARTERY BYPASS GRAFTING (CABG) TIMES FIVE USING LEFT INTERNAL MAMMARY TO LAD, SAPHENOUS VEIN GRAFT TO THE PD/PL, SAPHENOUS VEIN GRAFT TO DIAGONAL 1 AND 2 SEQUENTIALLY, SAPHENOUS VEIN HARVESTED ENDOSCOPICALLY (N/A) TRANSESOPHAGEAL ECHOCARDIOGRAM (TEE) (N/A) Mobilize Diuresis d/c tubes/lines Plan for transfer to step-down: see transfer orders     Monita Swier,Romie B 03/23/2014 11:44 AM

## 2014-03-23 NOTE — Plan of Care (Signed)
Problem: Phase I - Pre-Op Goal: Pre-Op Consults completed as appropriate Outcome: Completed/Met Date Met:  03/23/14 Goal: Treatment completed per MD order Outcome: Completed/Met Date Met:  03/23/14 Goal: Patient progressed to Phase II; barriers addressed Outcome: Completed/Met Date Met:  03/23/14  Problem: Phase II - Intermediate Post-Op Goal: Wean to Extubate Outcome: Completed/Met Date Met:  03/23/14 Goal: Maintain Hemodynamic Stability Outcome: Completed/Met Date Met:  03/23/14 Goal: CBGs/Blood Glucose per SCIP Criteria Outcome: Completed/Met Date Met:  03/23/14 Goal: Pain controlled with appropriate interventions Outcome: Completed/Met Date Met:  03/23/14 Goal: Advance Diet Outcome: Completed/Met Date Met:  03/23/14 Goal: Activity Progressed Outcome: Completed/Met Date Met:  03/23/14 Goal: Patient advanced to Phase III: Barriers addressed Outcome: Completed/Met Date Met:  03/23/14

## 2014-03-23 NOTE — Op Note (Signed)
NAME:  Robert Boyle, Robert Boyle             ACCOUNT NO.:  [id number]  MEDICAL RECORD NO.:  [id number]  LOCATION:  2S07C                        FACILITY:  [facility]  PHYSICIAN:  Lanelle Bal, MD    DATE OF BIRTH:  1936/04/15  DATE OF PROCEDURE:  03/21/2014 DATE OF DISCHARGE:                              OPERATIVE REPORT   PREOPERATIVE DIAGNOSIS:  New-onset angina with three-vessel coronary artery disease and positive stress test.  POSTOPERATIVE DIAGNOSIS:  New-onset angina with three-vessel coronary artery disease and positive stress test.  SURGICAL PROCEDURE:  Coronary artery bypass grafting x5 with the left internal mammary to the left anterior descending coronary artery, sequential reverse saphenous vein graft to the first and second diagonal coronary artery, sequential reverse saphenous vein graft to the posterior descending and posterolateral branches of the right coronary artery with right thigh and calf endo vein harvesting.  SURGEON:  Lanelle Bal, MD  FIRST ASSISTANT:  Lars Pinks, PA  BRIEF HISTORY:  The patient is a 78 year old male, who has noted over the last several months increasing shortness of breath with exertion and vague chest discomfort.  A Cardiolite stress test was performed and because of the patient's increasing symptoms and stress test, he was seen by Dr. Ellyn Hack and a cardiac catheterization was performed.  At the time of catheterization, he was noted to have a 70-80% stenosis in the proximal LAD at the site of a previous bare metal stent.  In addition, there was 80% stenosis of the first diagonal and in the second diagonal which had also been stented.  The right coronary artery was totally occluded with collateral filling from the left faintly.  The circumflex was a relatively small vessel without significant disease.  Overall ejection fraction appeared preserved.  Because of the patient's symptoms and positive stress test and three-vessel coronary  artery disease, coronary artery bypass grafting was recommended to the patient who agreed and signed informed consent.  DESCRIPTION OF PROCEDURE:  With Swan-Ganz and arterial line monitors in place, the patient underwent general endotracheal anesthesia without incident.  Skin of the chest and legs was prepped with Betadine and draped in usual sterile manner.  TEE probe was placed by Dr. Orene Desanctis and the findings are dictated under separate note.  However, there was no significant valvular disease noted and overall ejection fraction was just mildly globally depressed.  After appropriate time-out was performed, we proceeded with vein harvesting endoscopically from the right thigh and calf.  The vein was of adequate quality and caliber. Median sternotomy was performed.  Left internal mammary artery was dissected down as a pedicle graft.  The distal artery was divided and had good free flow.  Pericardium was opened.  Overall ventricular function appeared normal.  The patient was systemically heparinized. The aorta was noted to be mildly dilated just under 4 cm in size.  At his age of 67 years, it did not warrant replacement.  The patient was placed on cardiopulmonary bypass at 2.4 L/min/m2.  After full heparinization, sites of anastomosis were dissected out of the epicardium.  The patient's body temperature was cooled to 32 degrees. Aortic crossclamp was applied, and 600 mL of cold blood potassium cardioplegia was administered antegrade with diastolic  arrest of the heart.  Myocardial septal temperature was monitored throughout the crossclamp.  Attention was turned first to the patient's right coronary system.  The distal right coronary artery was severely calcified.  The posterior descending coronary artery was slightly small but was opened and had an adequate lumen admitting a 1-mm probe distally.  The vessel was approximately 1.2 mm in size.  Using a diamond-type side-to-side anastomosis,  a segment of reverse saphenous vein graft was anastomosed to the posterior descending coronary artery with a running 8-0 Prolene. The distal extent of the same vein was then trimmed to the appropriate length and slightly larger posterolateral branch of the right coronary artery was opened and admitted a 1.5-mm probe.  Using a running 8-0 Prolene, the vein was anastomosed to the posterolateral branch. Additional cold blood cardioplegia was administered down the vein graft. Attention was then turned to the diagonal branch which were relatively small 1-1.2 mm in size.  The first diagonal vessel was opened, and a longitudinal side-to-side anastomosis was carried out with a second reverse saphenous vein graft.  The distal extent of the same vein was then carried to the second diagonal vessel which was also small, and anastomosis was carried out with a running 8-0 Prolene.  We then turned to the left anterior descending coronary artery.  Between the distal and mid third of the LAD, the left internal mammary artery was opened. Using a running 8-0 Prolene, left internal mammary artery was anastomosed to the left anterior descending coronary artery.  With the crossclamp still in place, 2 punch aortotomies were performed.  Each of the two vein grafts anastomosed to the ascending aorta.  The heart was allowed to passively fill and de-air.  The crossclamp was removed with total cross- clamp time of 92 minutes.  The patient spontaneously converted to a sinus rhythm.  Sites of anastomosis were inspected and free of bleeding. The patient was then ventilated and weaned from cardiopulmonary bypass without difficulty.  He remained hemodynamically stable.  He was decannulated in usual fashion.  Protamine sulfate was administered. With the operative field hemostatic, 2 atrial and ventricular wires were applied.  A left pleural tube and Blake mediastinal drain were left in place.  Pericardium was loosely  reapproximated.  Sternum closed with #6 stainless steel wire.  Fascia was closed with interrupted 0 Vicryl, running 3-0 Vicryl in subcutaneous tissue, 4-0 subcuticular stitch.  Dry dressings were applied.  Sponge and needle count was reported as correct at the completion of procedure.  The patient tolerated the procedure without obvious complication, and was transferred to Surgical Intensive Care Unit for further postoperative care.  Total pump time was 120 minutes.  He did not require any blood bank blood products during the operative procedure.     Lanelle Bal, MD     EG/MEDQ  D:  03/23/2014  T:  03/23/2014  Job:  [id number]  cc:   Dr. Ellyn Hack

## 2014-03-23 NOTE — Progress Notes (Signed)
Medicare Important Message given.

## 2014-03-24 ENCOUNTER — Inpatient Hospital Stay (HOSPITAL_COMMUNITY): Payer: Medicare HMO

## 2014-03-24 LAB — CBC
HCT: 31 % — ABNORMAL LOW (ref 39.0–52.0)
Hemoglobin: 10.2 g/dL — ABNORMAL LOW (ref 13.0–17.0)
MCH: 31.2 pg (ref 26.0–34.0)
MCHC: 32.9 g/dL (ref 30.0–36.0)
MCV: 94.8 fL (ref 78.0–100.0)
Platelets: 89 10*3/uL — ABNORMAL LOW (ref 150–400)
RBC: 3.27 MIL/uL — ABNORMAL LOW (ref 4.22–5.81)
RDW: 14.4 % (ref 11.5–15.5)
WBC: 5.9 10*3/uL (ref 4.0–10.5)

## 2014-03-24 LAB — BASIC METABOLIC PANEL
Anion gap: 13 (ref 5–15)
BUN: 16 mg/dL (ref 6–23)
CO2: 23 mEq/L (ref 19–32)
Calcium: 9.3 mg/dL (ref 8.4–10.5)
Chloride: 99 mEq/L (ref 96–112)
Creatinine, Ser: 0.94 mg/dL (ref 0.50–1.35)
GFR calc Af Amer: >90 mL/min (ref >90)
GFR calc non Af Amer: 79 mL/min — ABNORMAL LOW (ref >90)
Glucose, Bld: 95 mg/dL (ref 70–99)
Potassium: 4.1 mEq/L (ref 3.7–5.3)
Sodium: 135 mEq/L — ABNORMAL LOW (ref 137–147)

## 2014-03-24 LAB — GLUCOSE, CAPILLARY
GLUCOSE-CAPILLARY: 101 mg/dL — AB (ref 70–99)
Glucose-Capillary: 97 mg/dL (ref 70–99)
Glucose-Capillary: 99 mg/dL (ref 70–99)
Glucose-Capillary: 105 mg/dL — ABNORMAL HIGH (ref 70–99)
Glucose-Capillary: 105 mg/dL — ABNORMAL HIGH (ref 70–99)

## 2014-03-24 MED ORDER — METOPROLOL TARTRATE 25 MG PO TABS
25.0000 mg | ORAL_TABLET | Freq: Two times a day (BID) | ORAL | Status: DC
  Administered 2014-03-24 (×2): 25 mg via ORAL
  Filled 2014-03-24 (×4): qty 1

## 2014-03-24 MED ORDER — METOPROLOL TARTRATE 25 MG/10 ML ORAL SUSPENSION
25.0000 mg | Freq: Two times a day (BID) | ORAL | Status: DC
  Filled 2014-03-24 (×4): qty 10

## 2014-03-24 NOTE — Progress Notes (Signed)
CARDIAC REHAB PHASE I   PRE:  Rate/Rhythm: 104 ST    BP: sitting 155/69    SaO2: 93 RA  MODE:  Ambulation: 500 ft   POST:  Rate/Rhythm: 128 ST    BP: sitting 169/91     SaO2: 95 RA  Pt stood fairly independently from chair. Used RW, assist x1. Some difficulty steering RW, walking straight. Hit object in floor x2. Increased distance from yesterday (150 ft yest). HR elevated with distance, SOB toward end with higher HR. Tired after walk, to bed. Very pleasant. Encouraged x2 more walks today. Pt is planning for d/c Monday. [id number]   Josephina Shih [place] CES, ACSM 03/24/2014 10:17 AM

## 2014-03-24 NOTE — Progress Notes (Signed)
Nursing note Patient ambulated in hallway with walker and assist x1 300 feet. Back in room call bell within reach. Will continue to monitor patient. Etta Gassett, Bettina Gavia RN

## 2014-03-24 NOTE — Progress Notes (Addendum)
[address]       [place] [address]             [phone]      3 Days Post-Op Procedure(s) (LRB): CORONARY ARTERY BYPASS GRAFTING (CABG) TIMES FIVE USING LEFT INTERNAL MAMMARY TO LAD, SAPHENOUS VEIN GRAFT TO THE PD/PL, SAPHENOUS VEIN GRAFT TO DIAGONAL 1 AND 2 SEQUENTIALLY, SAPHENOUS VEIN HARVESTED ENDOSCOPICALLY (N/A) TRANSESOPHAGEAL ECHOCARDIOGRAM (TEE) (N/A) Subjective: Feeling better, some SOB, some incis pain  Objective: Vital signs in last 24 hours: Temp:  [97.5 F (36.4 C)-98.8 F (37.1 C)] 98.4 F (36.9 C) (12/05 0455) Pulse Rate:  [104-109] 105 (12/05 0455) Cardiac Rhythm:  [-] Normal sinus rhythm (12/05 0746) Resp:  [15-24] 18 (12/05 0455) BP: (100-165)/(54-76) 125/72 mmHg (12/05 0455) SpO2:  [93 %-97 %] 93 % (12/05 0455) Weight:  [213 lb 8 oz (96.843 kg)-214 lb 11.7 oz (97.4 kg)] 213 lb 8 oz (96.843 kg) (12/05 0455)  Hemodynamic parameters for last 24 hours:    Intake/Output from previous day: 12/04 0701 - 12/05 0700 In: 50 [I.V.:50] Out: 675 [Urine:675] Intake/Output this shift:    General appearance: alert, cooperative and no distress Heart: regular rate and rhythm Lungs: dim in right base Abdomen: benign Extremities: min edema Wound: chest dressing CDI  Lab Results:  Recent Labs  03/23/14 0430 03/24/14 0450  WBC 5.6 5.9  HGB 10.0* 10.2*  HCT 30.2* 31.0*  PLT 71* 89*   BMET:  Recent Labs  03/23/14 0430 03/24/14 0450  NA 139 135*  K 3.9 4.1  CL 104 99  CO2 22 23  GLUCOSE 97 95  BUN 14 16  CREATININE 0.96 0.94  CALCIUM 8.8 9.3    PT/INR:  Recent Labs  03/21/14 1430  LABPROT 16.5*  INR 1.32   ABG    Component Value Date/Time   PHART 7.327* 03/21/2014 2009   HCO3 20.6 03/21/2014 2009   TCO2 20 03/22/2014 1750   ACIDBASEDEF 5.0* 03/21/2014 2009   O2SAT 92.0 03/21/2014 2009   CBG (last 3)   Recent Labs  03/23/14 1626 03/23/14 2137 03/24/14 0609  GLUCAP 132* 115* 101*    Meds Scheduled  Meds: . allopurinol  300 mg Oral Daily  . amLODipine  5 mg Oral Daily  . antiseptic oral rinse  7 mL Mouth Rinse BID  . aspirin EC  325 mg Oral Daily  . atorvastatin  80 mg Oral Daily  . docusate sodium  200 mg Oral Daily  . folic acid  1 mg Oral Daily  . furosemide  40 mg Oral Daily  . insulin aspart  0-24 Units Subcutaneous TID AC & HS  . metoprolol tartrate  12.5 mg Oral BID   Or  . metoprolol tartrate  12.5 mg Per Tube BID  . multivitamin with minerals  1 tablet Oral Daily  . mupirocin ointment  1 application Nasal BID  . pantoprazole  40 mg Oral Daily  . potassium chloride  20 mEq Oral Daily  . sodium chloride  3 mL Intravenous Q12H  . thiamine  100 mg Oral Daily   Or  . thiamine  100 mg Intravenous Daily   Continuous Infusions: . sodium chloride Stopped (03/23/14 1200)  . phenylephrine (NEO-SYNEPHRINE) Adult infusion Stopped (03/21/14 1800)   PRN Meds:.sodium chloride, bisacodyl **OR** bisacodyl, ondansetron **OR** ondansetron (ZOFRAN) IV, oxyCODONE, sodium chloride, traMADol  Xrays Dg Chest Port 1 View  03/23/2014   CLINICAL DATA:  Post CABG procedure. History of coronary artery  disease.  EXAM: PORTABLE CHEST - 1 VIEW  COMPARISON:  03/22/2014  FINDINGS: Swan-Ganz catheter has been removed but the right jugular central line is still present. There is marked elevation of the right hemidiaphragm with associated volume loss in the right lung. Again noted is a left chest tube without a pneumothorax. Persistent consolidation in the retrocardiac space. Mediastinal drain has been removed.  IMPRESSION: Marked volume loss in the right lung with elevation of the right hemidiaphragm.  Persistent consolidation at the left lung base.  No evidence for a pneumothorax.   Electronically Signed   By: Markus Daft M.D.   On: 03/23/2014 08:47    Assessment/Plan: S/P Procedure(s) (LRB): CORONARY ARTERY BYPASS GRAFTING (CABG) TIMES FIVE USING LEFT INTERNAL MAMMARY TO LAD, SAPHENOUS VEIN GRAFT TO THE  PD/PL, SAPHENOUS VEIN GRAFT TO DIAGONAL 1 AND 2 SEQUENTIALLY, SAPHENOUS VEIN HARVESTED ENDOSCOPICALLY (N/A) TRANSESOPHAGEAL ECHOCARDIOGRAM (TEE) (N/A)  1 doing well, HR/BP up at times- will increase beta blocker , may need to increase norvasc 2 push pulm toilet/rehab as able 3 H/H is stable, renal fxn is stable 4 needs some diuresis     LOS: 4 days    GOLD,WAYNE E 03/24/2014   Chart reviewed, patient examined, agree with above. He is progressing well and should be able to go home tomorrow or Monday. No BM yet.

## 2014-03-24 NOTE — Plan of Care (Signed)
Problem: Phase III - Recovery through Discharge Goal: Activity Progressed Outcome: Progressing

## 2014-03-24 NOTE — Progress Notes (Signed)
Nursing note Patient ambulated in hallway with rolling walker, 340 feet. Tolerated well, however slightly short of breath. Will continue to monitor patient. Hollyanne Schloesser, Bettina Gavia RN

## 2014-03-25 LAB — GLUCOSE, CAPILLARY
GLUCOSE-CAPILLARY: 94 mg/dL (ref 70–99)
Glucose-Capillary: 99 mg/dL (ref 70–99)
Glucose-Capillary: 134 mg/dL — ABNORMAL HIGH (ref 70–99)
Glucose-Capillary: 119 mg/dL — ABNORMAL HIGH (ref 70–99)
Glucose-Capillary: 121 mg/dL — ABNORMAL HIGH (ref 70–99)

## 2014-03-25 MED ORDER — METOPROLOL TARTRATE 50 MG PO TABS
50.0000 mg | ORAL_TABLET | Freq: Two times a day (BID) | ORAL | Status: DC
  Administered 2014-03-25 – 2014-03-26 (×4): 50 mg via ORAL
  Filled 2014-03-25 (×6): qty 1

## 2014-03-25 MED ORDER — METOPROLOL TARTRATE 25 MG/10 ML ORAL SUSPENSION
50.0000 mg | Freq: Two times a day (BID) | ORAL | Status: DC
  Filled 2014-03-25 (×6): qty 20

## 2014-03-25 NOTE — Progress Notes (Addendum)
[address]       [place] [address]             [phone]      4 Days Post-Op Procedure(s) (LRB): CORONARY ARTERY BYPASS GRAFTING (CABG) TIMES FIVE USING LEFT INTERNAL MAMMARY TO LAD, SAPHENOUS VEIN GRAFT TO THE PD/PL, SAPHENOUS VEIN GRAFT TO DIAGONAL 1 AND 2 SEQUENTIALLY, SAPHENOUS VEIN HARVESTED ENDOSCOPICALLY (N/A) TRANSESOPHAGEAL ECHOCARDIOGRAM (TEE) (N/A) Subjective: Tired , but conts to feel better overall  Objective: Vital signs in last 24 hours: Temp:  [98.4 F (36.9 C)-98.7 F (37.1 C)] 98.4 F (36.9 C) (12/06 0555) Pulse Rate:  [105-111] 108 (12/06 0555) Cardiac Rhythm:  [-] Normal sinus rhythm (12/06 0733) Resp:  [17-18] 18 (12/06 0555) BP: (127-169)/(63-91) 138/63 mmHg (12/06 0555) SpO2:  [91 %-94 %] 94 % (12/06 0555) Weight:  [210 lb 12.8 oz (95.618 kg)] 210 lb 12.8 oz (95.618 kg) (12/06 0555)  Hemodynamic parameters for last 24 hours:    Intake/Output from previous day: 12/05 0701 - 12/06 0700 In: 303 [P.O.:300; I.V.:3] Out: 1325 [Urine:1325] Intake/Output this shift:    General appearance: alert, cooperative and no distress Heart: regular rate and rhythm and tachy Lungs: dim in the right base Abdomen: benign Extremities: no edema Wound: incis healing well  Lab Results:  Recent Labs  03/23/14 0430 03/24/14 0450  WBC 5.6 5.9  HGB 10.0* 10.2*  HCT 30.2* 31.0*  PLT 71* 89*   BMET:  Recent Labs  03/23/14 0430 03/24/14 0450  NA 139 135*  K 3.9 4.1  CL 104 99  CO2 22 23  GLUCOSE 97 95  BUN 14 16  CREATININE 0.96 0.94  CALCIUM 8.8 9.3    PT/INR: No results for input(s): LABPROT, INR in the last 72 hours. ABG    Component Value Date/Time   PHART 7.327* 03/21/2014 2009   HCO3 20.6 03/21/2014 2009   TCO2 20 03/22/2014 1750   ACIDBASEDEF 5.0* 03/21/2014 2009   O2SAT 92.0 03/21/2014 2009   CBG (last 3)   Recent Labs  03/24/14 1625 03/24/14 2056 03/25/14 0559  GLUCAP 105* 105* 94    Meds Scheduled  Meds: . allopurinol  300 mg Oral Daily  . amLODipine  5 mg Oral Daily  . antiseptic oral rinse  7 mL Mouth Rinse BID  . aspirin EC  325 mg Oral Daily  . atorvastatin  80 mg Oral Daily  . docusate sodium  200 mg Oral Daily  . folic acid  1 mg Oral Daily  . furosemide  40 mg Oral Daily  . insulin aspart  0-24 Units Subcutaneous TID AC & HS  . metoprolol tartrate  25 mg Oral BID   Or  . metoprolol tartrate  25 mg Per Tube BID  . multivitamin with minerals  1 tablet Oral Daily  . mupirocin ointment  1 application Nasal BID  . pantoprazole  40 mg Oral Daily  . potassium chloride  20 mEq Oral Daily  . sodium chloride  3 mL Intravenous Q12H  . thiamine  100 mg Oral Daily   Continuous Infusions: . sodium chloride Stopped (03/23/14 1200)  . phenylephrine (NEO-SYNEPHRINE) Adult infusion Stopped (03/21/14 1800)   PRN Meds:.sodium chloride, bisacodyl **OR** bisacodyl, ondansetron **OR** ondansetron (ZOFRAN) IV, oxyCODONE, sodium chloride, traMADol  Xrays Dg Chest 2 View  03/24/2014   CLINICAL DATA:  Postop CABG. History of hypertension and smoking. Initial encounter.  EXAM: CHEST  2 VIEW  COMPARISON:  Radiographs 03/22/2014 and 03/23/2014.  FINDINGS: 0647 hr. Interval removal of the right IJ sheath. There is persistent eventration of the right hemidiaphragm with associated right basilar atelectasis. There is mild residual left lower lobe atelectasis, also improved. Small bilateral pleural effusions are present. There is no pneumothorax. The heart size and mediastinal contours are stable status post CABG.  IMPRESSION: Interval improved aeration of both lung bases.  No pneumothorax.   Electronically Signed   By: Camie Patience M.D.   On: 03/24/2014 10:05   Dg Chest Port 1 View  03/23/2014   CLINICAL DATA:  Post CABG procedure. History of coronary artery disease.  EXAM: PORTABLE CHEST - 1 VIEW  COMPARISON:  03/22/2014  FINDINGS: Swan-Ganz catheter has been removed but the right jugular central line is  still present. There is marked elevation of the right hemidiaphragm with associated volume loss in the right lung. Again noted is a left chest tube without a pneumothorax. Persistent consolidation in the retrocardiac space. Mediastinal drain has been removed.  IMPRESSION: Marked volume loss in the right lung with elevation of the right hemidiaphragm.  Persistent consolidation at the left lung base.  No evidence for a pneumothorax.   Electronically Signed   By: Markus Daft M.D.   On: 03/23/2014 08:47    Assessment/Plan: S/P Procedure(s) (LRB): CORONARY ARTERY BYPASS GRAFTING (CABG) TIMES FIVE USING LEFT INTERNAL MAMMARY TO LAD, SAPHENOUS VEIN GRAFT TO THE PD/PL, SAPHENOUS VEIN GRAFT TO DIAGONAL 1 AND 2 SEQUENTIALLY, SAPHENOUS VEIN HARVESTED ENDOSCOPICALLY (N/A) TRANSESOPHAGEAL ECHOCARDIOGRAM (TEE) (N/A)  1 conts to progress well 2 will up-titrate betablocker further to control HR- BP is also improving 3 cont rehab/pulm toilet 4 recheck platelets in am to make sure still trending up   LOS: 5 days    GOLD,WAYNE E 03/25/2014   Chart reviewed, patient examined, agree with above.

## 2014-03-25 NOTE — Progress Notes (Signed)
Patient ambulated in hallway x1 assist with walker 300 feet. Back in chair call bell within reach will monitor patient. Blayze Haen, Bettina Gavia RN

## 2014-03-25 NOTE — Plan of Care (Signed)
Problem: Surgery Discharge Goal: Tolerating diet Outcome: Completed/Met Date Met:  03/25/14     

## 2014-03-25 NOTE — Plan of Care (Signed)
Problem: Surgery Discharge Goal: Discharge plan in place and appropriate Outcome: Completed/Met Date Met:  03/25/14

## 2014-03-25 NOTE — Progress Notes (Signed)
Nursing note Patient ambulated in hallway x1 assist with walker, slight difficulty steering rolling walker. 300 feet. Back in bed call bell with in reach. Will monitor patient. Mitcheal Sweetin, Bettina Gavia RN

## 2014-03-25 NOTE — Progress Notes (Signed)
Patient requesting something for constipation, dulcolax given as ordered as needed for constipation. Will monitor patient . Batool Majid, Bettina Gavia RN

## 2014-03-25 NOTE — Op Note (Signed)
Robert Boyle, Robert Boyle             ACCOUNT NO.:  [id number]  MEDICAL RECORD NO.:  [id number]  LOCATION:  2W23C                        FACILITY:  [facility]  PHYSICIAN:  Ala Dach, M.D.DATE OF BIRTH:  07-30-1935  DATE OF PROCEDURE:  03/21/2014 DATE OF DISCHARGE:                              OPERATIVE REPORT   PROCEDURE:  Intraoperative transesophageal echocardiography.  INDICATIONS FOR PROCEDURE:  [name] is a 78 year old gentleman who presents today for coronary artery bypass grafting to be performed by Dr. Lanelle Bal.  He is brought to the holding area the morning of surgery where under local anesthesia with sedation, pulmonary artery and radial arterial lines are begun.  He was then taken to the OR for routine induction of general anesthesia after which the TEE probe was prepared and then passed oropharyngeally into the stomach, then slightly withdrawn for imaging of cardiac structures.  PRE-CARDIOPULMONARY BYPASS TEE EXAMINATION:  Left ventricle:  The left ventricular chamber is seen initially in the short axis view.  There appeared to be good excellent left ventricular function and contractile pattern appreciated.  Overall, there are some septal and inferior wall hypokinesis appreciated, this is especially noted in the inferior wall per se.  The anterior and anterolateral walls were contractile and normally thickening.  On long-axis views, the papillary muscles are well outlined.  There are no other masses appreciated.  Mitral valve:  This is a thin, compliant, mobile apparatus.  It is initially seen in the four-chamber view.  On pulse-wave Doppler, there is a blunted E-wave suggested of increased diastolic or some degree of diastolic dysfunction.  Overall, the mitral valve appeared normal in its function and coaptation points.  Color Doppler revealed trivial mitral regurgitant jet.  The left atrium is normal.  Left atrial chamber is appreciated.   The interatrial septum is interrogated and is intact.  Aortic valve:  The aortic valve is seen initially in the short axis view.  It is trileaflet.  There is mild aortic sclerosis appreciated, all 3 cusps open satisfactorily and closed appropriately.  There is no aortic insufficiency or an aortic obstruction or stenosis appreciated.  The right ventricular chamber is dynamic, but mildly enlarged chamber.  Tricuspid valve:  Normal, thin, compliant, mobile apparatus.  Right atrium:  Normal right atrial chamber is appreciated.  The patient was placed on cardiopulmonary bypass.  Hypothermia is begun. Coronary artery bypass grafting is carried out.  Subsequently, the patient is rewarmed and separated from cardiopulmonary bypass with the initial attempt.  POST CARDIOPULMONARY BYPASS TEE EXAMINATION:  Left ventricle:  The left ventricular chamber is again viewed in the short axis view.  It is vigorous, dynamic with essentially no significant changes in the post bypass period.  Multiple images are carried out, both long and short axis view, it remains with good excellent contractile pattern.  A short scan of the other areas and chambers of the heart revealed no significant changes from the prebypass period.  The patient ultimately returned to the cardiac intensive care unit in stable condition.          ______________________________ Ala Dach, M.D.     JTM/MEDQ  D:  03/25/2014  T:  03/25/2014  Job:  [id number] 

## 2014-03-25 NOTE — Plan of Care (Signed)
Problem: Surgery Discharge Goal: Pain controlled with appropriate interventions Outcome: Completed/Met Date Met:  03/25/14

## 2014-03-26 DIAGNOSIS — E785 Hyperlipidemia, unspecified: Secondary | ICD-10-CM

## 2014-03-26 DIAGNOSIS — Z951 Presence of aortocoronary bypass graft: Secondary | ICD-10-CM

## 2014-03-26 DIAGNOSIS — R931 Abnormal findings on diagnostic imaging of heart and coronary circulation: Secondary | ICD-10-CM

## 2014-03-26 DIAGNOSIS — Z9861 Coronary angioplasty status: Secondary | ICD-10-CM

## 2014-03-26 DIAGNOSIS — I10 Essential (primary) hypertension: Secondary | ICD-10-CM

## 2014-03-26 LAB — GLUCOSE, CAPILLARY
GLUCOSE-CAPILLARY: 94 mg/dL (ref 70–99)
GLUCOSE-CAPILLARY: 115 mg/dL — AB (ref 70–99)
Glucose-Capillary: 84 mg/dL (ref 70–99)
Glucose-Capillary: 115 mg/dL — ABNORMAL HIGH (ref 70–99)

## 2014-03-26 LAB — CBC
HEMATOCRIT: 28.8 % — AB (ref 39.0–52.0)
Hemoglobin: 9.6 g/dL — ABNORMAL LOW (ref 13.0–17.0)
MCH: 31.5 pg (ref 26.0–34.0)
MCHC: 33.3 g/dL (ref 30.0–36.0)
MCV: 94.4 fL (ref 78.0–100.0)
Platelets: 140 10*3/uL — ABNORMAL LOW (ref 150–400)
RBC: 3.05 MIL/uL — ABNORMAL LOW (ref 4.22–5.81)
RDW: 14.4 % (ref 11.5–15.5)
WBC: 5.1 10*3/uL (ref 4.0–10.5)

## 2014-03-26 MED ORDER — POTASSIUM CHLORIDE ER 10 MEQ PO TBCR
10.0000 meq | EXTENDED_RELEASE_TABLET | Freq: Every day | ORAL | Status: DC
  Administered 2014-03-26 – 2014-03-27 (×2): 10 meq via ORAL
  Filled 2014-03-26 (×2): qty 1

## 2014-03-26 MED ORDER — FUROSEMIDE 40 MG PO TABS
40.0000 mg | ORAL_TABLET | Freq: Every day | ORAL | Status: DC
  Administered 2014-03-26 – 2014-03-27 (×2): 40 mg via ORAL
  Filled 2014-03-26 (×2): qty 1

## 2014-03-26 MED ORDER — LACTULOSE 10 GM/15ML PO SOLN
30.0000 g | Freq: Every day | ORAL | Status: DC | PRN
  Administered 2014-03-26: 30 g via ORAL
  Filled 2014-03-26 (×2): qty 45

## 2014-03-26 MED ORDER — LISINOPRIL 10 MG PO TABS
10.0000 mg | ORAL_TABLET | Freq: Every day | ORAL | Status: DC
  Administered 2014-03-26 – 2014-03-27 (×2): 10 mg via ORAL
  Filled 2014-03-26 (×2): qty 1

## 2014-03-26 NOTE — Progress Notes (Signed)
EPW discontinued per protocol and orders. Patient tolerated procedure well. Wires intact. Patient verbalized understanding of bedrest x 1 hour and will call RN with need. Call light within reach. Alfredo Bach RN BSN 03/26/2014 10:40 AM

## 2014-03-26 NOTE — Evaluation (Signed)
Physical Therapy Evaluation Patient Details Name: Robert Boyle MRN: [id number] DOB: [birth year] Today's Date: 03/26/2014   History of Present Illness  s/p CABG x 5  due to abnormal stress test with h/o stent placement and SOB  Clinical Impression  Pt is a pleasant gentleman and motivated to return to PLOF. Pt demonstrating below listed impairments limiting functional mobility at this time; especially decreased endurance and dynamic standing balance. Recommend RW for gait and mobility at this time due to the above mentioned and follow up therapy. Plan for wife and other family to provide 24/7 assist as pt reports he has a lot of family support available.     Follow Up Recommendations Home health PT;Supervision/Assistance - 24 hour    Equipment Recommendations  Rolling walker with 5" wheels    Recommendations for Other Services       Precautions / Restrictions Precautions Precautions: Sternal Precaution Comments: Pt able to verbalize precautions independently and adhere to these with cues Restrictions Weight Bearing Restrictions: Yes (Sternal precautions))      Mobility  Bed Mobility               General bed mobility comments: Pt already up in recliner. Educated on technique to maintain precautions  Transfers Overall transfer level: Needs assistance Equipment used: Rolling walker (2 wheeled) Transfers: Sit to/from Stand Sit to Stand: Min guard         General transfer comment: Sit to stand without use of UE's x 2 attempts from low recliner but able to perform with steady A  Ambulation/Gait Ambulation/Gait assistance: Min guard Ambulation Distance (Feet): 500 Feet Assistive device: Rolling walker (2 wheeled) Gait Pattern/deviations: Shuffle;Trunk flexed     General Gait Details: Decreased balance noted with obstacle negotiation and cues and steady A needed when backing up to recliner once back in room. Cues to stay inside RW and not push too far ahead as  tendency to flex at trunk  Stairs            Wheelchair Mobility    Modified Rankin (Stroke Patients Only)       Balance Overall balance assessment: Needs assistance         Standing balance support: Single extremity supported Standing balance-Leahy Scale: Fair Standing balance comment: slight LOB posteriorly initially wiht sit to stand and decreased balance during retro gait or obstacle negotiation functionally                             Pertinent Vitals/Pain Pain Assessment: No/denies pain    Home Living Family/patient expects to be discharged to:: Private residence Living Arrangements: Spouse/significant other Available Help at Discharge: Family;Available 24 hours/day Type of Home: House Home Access: Stairs to enter Entrance Stairs-Rails: None Entrance Stairs-Number of Steps: 2 Home Layout: Two level Home Equipment: None      Prior Function Level of Independence: Independent         Comments: Pt likes to golf with group of retired friends and travel     Journalist, newspaper        Extremity/Trunk Assessment   Upper Extremity Assessment: Generalized weakness (sternal precautions)           Lower Extremity Assessment: Generalized weakness      Cervical / Trunk Assessment: Kyphotic  Communication   Communication: No difficulties  Cognition Arousal/Alertness: Awake/alert Behavior During Therapy: WFL for tasks assessed/performed Overall Cognitive Status: Within Functional Limits for tasks assessed  General Comments      Exercises        Assessment/Plan    PT Assessment Patient needs continued PT services  PT Diagnosis Difficulty walking;Generalized weakness   PT Problem List Decreased strength;Decreased activity tolerance;Decreased balance;Decreased mobility;Decreased coordination;Decreased knowledge of use of DME;Decreased safety awareness;Decreased knowledge of precautions;Cardiopulmonary  status limiting activity  PT Treatment Interventions DME instruction;Gait training;Stair training;Functional mobility training;Therapeutic activities;Therapeutic exercise;Balance training;Patient/family education   PT Goals (Current goals can be found in the Care Plan section) Acute Rehab PT Goals Patient Stated Goal: go home soon PT Goal Formulation: With patient Time For Goal Achievement: 04/02/14 Potential to Achieve Goals: Good    Frequency Min 3X/week   Barriers to discharge   Pt likely to require min A for stair negotiation due to no rails for home entry    Co-evaluation               End of Session Equipment Utilized During Treatment: Gait belt Activity Tolerance: Patient tolerated treatment well Patient left: in chair;with call bell/phone within reach Nurse Communication: Mobility status         Time: 5053-9767 PT Time Calculation (min) (ACUTE ONLY): 23 min   Charges:   PT Evaluation $Initial PT Evaluation Tier I: 1 Procedure PT Treatments $Gait Training: 8-22 mins   PT G Codes:          Allayne Gitelman 03/26/2014, 1:54 PM

## 2014-03-26 NOTE — Progress Notes (Signed)
Subjective:  POD #5 CABG X5. Looks good. NSR. No CP  Objective:  Temp:  [98.4 F (36.9 C)-99.1 F (37.3 C)] 99.1 F (37.3 C) (12/07 0530) Pulse Rate:  [100-112] 106 (12/07 0944) Resp:  [16-18] 16 (12/06 2133) BP: (122-158)/(61-70) 158/65 mmHg (12/07 0944) SpO2:  [91 %-92 %] 92 % (12/07 0530) Weight:  [209 lb 12.8 oz (95.165 kg)] 209 lb 12.8 oz (95.165 kg) (12/07 0500) Weight change: -1 lb (-0.454 kg)  Intake/Output from previous day: 12/06 0701 - 12/07 0700 In: 600 [P.O.:600] Out: 700 [Urine:700]  Intake/Output from this shift: Total I/O In: 240 [P.O.:240] Out: -   Physical Exam: General appearance: alert and no distress Neck: no adenopathy, no carotid bruit, no JVD, supple, symmetrical, trachea midline and thyroid not enlarged, symmetric, no tenderness/mass/nodules Lungs: clear to auscultation bilaterally Heart: regular rate and rhythm, S1, S2 normal, no murmur, click, rub or gallop Extremities: extremities normal, atraumatic, no cyanosis or edema  Lab Results: Results for orders placed or performed during the hospital encounter of 03/20/14 (from the past 48 hour(s))  Glucose, capillary     Status: None   Collection Time: 03/24/14 11:14 AM  Result Value Ref Range   Glucose-Capillary 99 70 - 99 mg/dL   Comment 1 Notify RN   Glucose, capillary     Status: Abnormal   Collection Time: 03/24/14  4:25 PM  Result Value Ref Range   Glucose-Capillary 105 (H) 70 - 99 mg/dL  Glucose, capillary     Status: Abnormal   Collection Time: 03/24/14  8:56 PM  Result Value Ref Range   Glucose-Capillary 105 (H) 70 - 99 mg/dL  Glucose, capillary     Status: None   Collection Time: 03/25/14  5:59 AM  Result Value Ref Range   Glucose-Capillary 94 70 - 99 mg/dL  Glucose, capillary     Status: None   Collection Time: 03/25/14 11:36 AM  Result Value Ref Range   Glucose-Capillary 99 70 - 99 mg/dL   Comment 1 Notify RN   Glucose, capillary     Status: Abnormal   Collection Time:  03/25/14  4:09 PM  Result Value Ref Range   Glucose-Capillary 134 (H) 70 - 99 mg/dL  Glucose, capillary     Status: Abnormal   Collection Time: 03/25/14  4:39 PM  Result Value Ref Range   Glucose-Capillary 119 (H) 70 - 99 mg/dL  Glucose, capillary     Status: Abnormal   Collection Time: 03/25/14  9:07 PM  Result Value Ref Range   Glucose-Capillary 121 (H) 70 - 99 mg/dL  CBC     Status: Abnormal   Collection Time: 03/26/14  3:28 AM  Result Value Ref Range   WBC 5.1 4.0 - 10.5 K/uL   RBC 3.05 (L) 4.22 - 5.81 MIL/uL   Hemoglobin 9.6 (L) 13.0 - 17.0 g/dL   HCT 28.8 (L) 39.0 - 52.0 %   MCV 94.4 78.0 - 100.0 fL   MCH 31.5 26.0 - 34.0 pg   MCHC 33.3 30.0 - 36.0 g/dL   RDW 14.4 11.5 - 15.5 %   Platelets 140 (L) 150 - 400 K/uL    Comment: DELTA CHECK NOTED REPEATED TO VERIFY SPECIMEN CHECKED FOR CLOTS   Glucose, capillary     Status: None   Collection Time: 03/26/14  6:45 AM  Result Value Ref Range   Glucose-Capillary 84 70 - 99 mg/dL    Imaging: Imaging results have been reviewed  Tele: NSR  Assessment/Plan:  1. Principal Problem: 2.   Angina, class IV 3. Active Problems: 4.   CAD STATUS post bare-metal stent to diagonal 2000 (AVE660 2.5x18), treated for in stent restenosis 2001 5.   Hyperlipidemia with target LDL less than 70 6.   Essential hypertension 7.   Abnormal nuclear stress test 8.   S/P CABG x 5: LIMA-LAD, SVG-D1-D2, SVG-RPDA-RPL 9.   CAD, multiple vessel 10.   Time Spent Directly with Patient:  20 minutes  Length of Stay:  LOS: 6 days   POD #5 CABG X5 after abn myoview which led to cath 12/1 by Dr [facility] revealing 3VD. Looks good. Labs [place]. Exam benign. Getting [facility]. Nl progression per TCTS. Prob home tomorrow. F/U with Dr. Sallyanne Kuster.  Lorretta Harp 03/26/2014, 9:56 AM

## 2014-03-26 NOTE — Progress Notes (Signed)
CARDIAC REHAB PHASE I   PRE:  Rate/Rhythm: 106 ST  BP:  Supine:   Sitting: 158/65  Standing:    SaO2: 93%RA  MODE:  Ambulation: 550 ft   POST:  Rate/Rhythm: 120 ST  BP:  Supine:   Sitting: 160/69  Standing:    SaO2: 94%RA 1007-1035 Pt walked 550 ft on RA with gait belt use, rolling walker and asst x 1 with fairly steady gait. Pt removed hand from walker to scratch his head and became wobbly. Did not notice shuffling gait but pt stated he could tell that his gait is not up to par. To bed for pacing wire removal. Think PT consult is appropriate.   Graylon Good, RN BSN  03/26/2014 10:32 AM

## 2014-03-26 NOTE — Progress Notes (Signed)
Pt ambulated approximately 350 ft with assist x1 and RW.  Pt c/o mild SOB, otherwise tolerated well.  SpO2 on return on RA 93%.  Pt back in bed with call bell in reach.  Will continue to monitor.

## 2014-03-26 NOTE — Progress Notes (Signed)
Pt ambulated 450 ft x1 assist on RA using front wheel walker. Pt slightly unsteady during ambulation with a few moments of shuffling. Pt had no complaints of pain after ambulation.

## 2014-03-26 NOTE — Progress Notes (Signed)
CARE MANAGEMENT NOTE 03/26/2014  Patient:  Robert Boyle, Robert Boyle   Account Number:  [id number]  Date Initiated:  03/26/2014  Documentation initiated by:  [facility]  Subjective/Objective Assessment:   s/p CABG     Action/Plan:   home with wife, Jovita Gamma   Anticipated DC Date:  03/27/2014   Anticipated DC Plan:  [place]  CM consult      Choice offered to / List presented to:             Status of service:  Completed, signed off Medicare Important Message given?  YES (If response is "NO", the following Medicare IM given date fields will be blank) Date Medicare IM given:  03/26/2014 Medicare IM given by:  [facility] Date Additional Medicare IM given:   Additional Medicare IM given by:    Discharge Disposition:  HOME/SELF CARE  Per UR Regulation:    If discussed at Long Length of Stay Meetings, dates discussed:    Comments:  03/26/2014 1530 NCM spoke to pt and states he needs a RW for home. Will need signed DME order from attending. Will contact [facility] for RW on 12/7. Jonnie Finner RN CCM Case Mgmt phone [phone]

## 2014-03-26 NOTE — Discharge Instructions (Signed)
Endoscopic Saphenous Vein Harvesting °Care After °Refer to this sheet in the next few weeks. These instructions provide you with information on caring for yourself after your procedure. Your health care provider may also give you more specific instructions. Your treatment has been planned according to current medical practices, but problems sometimes occur. Call your health care provider if you have any problems or questions after your procedure. °HOME CARE INSTRUCTIONS °Medicine °· Take whatever pain medicine your surgeon prescribes. Follow the directions carefully. Do not take over-the-counter pain medicine unless your surgeon says it is okay. Some pain medicine can cause bleeding problems for several weeks after surgery. °· Follow your surgeon's instructions about driving. You will probably not be permitted to drive after heart surgery. °· Take any medicines your surgeon prescribes. Any medicines you took before your heart surgery should be checked with your health care provider before you start taking them again. °Wound care °· If your surgeon has prescribed an elastic bandage or stocking, ask how long you should wear it. °· Check the area around your surgical cuts (incisions) whenever your bandages (dressings) are changed. Look for any redness or swelling. °· You will need to return to have the stitches (sutures) or staples taken out. Ask your surgeon when to do that. °· Ask your surgeon when you can shower or bathe. °Activity °· Try to keep your legs raised when you are sitting. °· Do any exercises your health care providers have given you. These may include deep breathing exercises, coughing, walking, or other exercises. °SEEK MEDICAL CARE IF: °· You have any questions about your medicines. °· You have more leg pain, especially if your pain medicine stops working. °· New or growing bruises develop on your leg. °· Your leg swells, feels tight, or becomes red. °· You have numbness in your leg. °SEEK IMMEDIATE  MEDICAL CARE IF: °· Your pain gets much worse. °· Blood or fluid leaks from any of the incisions. °· Your incisions become warm, swollen, or red. °· You have chest pain. °· You have trouble breathing. °· You have a fever. °· You have more pain near your leg incision. °MAKE SURE YOU: °· Understand these instructions. °· Will watch your condition. °· Will get help right away if you are not doing well or get worse. °Document Released: 12/17/2010 Document Revised: 04/11/2013 Document Reviewed: 12/17/2010 °Exit[employer]® Patient Information ©2015 [employer]. This information is not intended to replace advice given to you by your health care provider. Make sure you discuss any questions you have with your health care provider. °Coronary Artery Bypass Grafting, Care After °These instructions give you information on caring for yourself after your procedure. Your doctor may also give you more specific instructions. Call your doctor if you have any problems or questions after your procedure.  °HOME CARE °· Only take medicine as told by your doctor. Take medicines exactly as told. Do not stop taking medicines or start any new medicines without talking to your doctor first. °· Take your pulse as told by your doctor. °· Do deep breathing as told by your doctor. Use your breathing device (incentive spirometer), if given, to practice deep breathing several times a day. Support your chest with a pillow or your arms when you take deep breaths or cough. °· Keep the area clean, dry, and protected where the surgery cuts (incisions) were made. Remove bandages (dressings) only as told by your doctor. If strips were applied to surgical area, do not take them off. They fall off   on their own. °· Check the surgery area daily for puffiness (swelling), redness, or leaking fluid. °· If surgery cuts were made in your legs: °· Avoid crossing your legs. °· Avoid sitting for long periods of time. Change positions every 30 minutes. °· Raise your legs  when you are sitting. Place them on pillows. °· Wear stockings that help keep blood clots from forming in your legs (compression stockings). °· Only take sponge baths until your doctor says it is okay to take showers. Pat the surgery area dry. Do not rub the surgery area with a washcloth or towel. Do not bathe, swim, or use a hot tub until your doctor says it is okay. °· Eat foods that are high in fiber. These include raw fruits and vegetables, whole grains, beans, and nuts. Choose lean meats. Avoid canned, processed, and fried foods. °· Drink enough fluids to keep your pee (urine) clear or pale yellow. °· Weigh yourself every day. °· Rest and limit activity as told by your doctor. You may be told to: °· Stop any activity if you have chest pain, shortness of breath, changes in heartbeat, or dizziness. Get help right away if this happens. °· Move around often for short amounts of time or take short walks as told by your doctor. Gradually become more active. You may need help to strengthen your muscles and build endurance. °· Avoid lifting, pushing, or pulling anything heavier than 10 pounds (4.5 kg) for at least 6 weeks after surgery. °· Do not drive until your doctor says it is okay. °· Ask your doctor when you can go back to work. °· Ask your doctor when you can begin sexual activity again. °· Follow up with your doctor as told. °GET HELP IF: °· You have puffiness, redness, more pain, or fluid draining from the incision site. °· You have a fever. °· You have puffiness in your ankles or legs. °· You have pain in your legs. °· You gain 2 or more pounds (0.9 kg) a day. °· You feel sick to your stomach (nauseous) or throw up (vomit). °· You have watery poop (diarrhea). °GET HELP RIGHT AWAY IF: °· You have chest pain that goes to your jaw or arms. °· You have shortness of breath. °· You have a fast or irregular heartbeat. °· You notice a "clicking" in your breastbone when you move. °· You have numbness or weakness in  your arms or legs. °· You feel dizzy or light-headed. °MAKE SURE YOU: °· Understand these instructions. °· Will watch your condition. °· Will get help right away if you are not doing well or get worse. °Document Released: 04/11/2013 Document Reviewed: 04/11/2013 °ExitCare® Patient Information ©2015 [employer]. This information is not intended to replace advice given to you by your health care provider. Make sure you discuss any questions you have with your health care provider. ° °

## 2014-03-26 NOTE — Progress Notes (Addendum)
[address]       [place] [address]             [phone]      5 Days Post-Op Procedure(s) (LRB): CORONARY ARTERY BYPASS GRAFTING (CABG) TIMES FIVE USING LEFT INTERNAL MAMMARY TO LAD, SAPHENOUS VEIN GRAFT TO THE PD/PL, SAPHENOUS VEIN GRAFT TO DIAGONAL 1 AND 2 SEQUENTIALLY, SAPHENOUS VEIN HARVESTED ENDOSCOPICALLY (N/A) TRANSESOPHAGEAL ECHOCARDIOGRAM (TEE) (N/A) Subjective: Needs BM, nurses are concerned he is not walking safely yet- some unsteadyness. HR is better controlled  Objective: Vital signs in last 24 hours: Temp:  [98.4 F (36.9 C)-99.1 F (37.3 C)] 99.1 F (37.3 C) (12/07 0530) Pulse Rate:  [100-112] 100 (12/07 0530) Cardiac Rhythm:  [-] Normal sinus rhythm (12/06 0733) Resp:  [16-18] 16 (12/06 2133) BP: (122-147)/(61-70) 142/63 mmHg (12/07 0530) SpO2:  [91 %-92 %] 92 % (12/07 0530) Weight:  [209 lb 12.8 oz (95.165 kg)] 209 lb 12.8 oz (95.165 kg) (12/07 0500)  Hemodynamic parameters for last 24 hours:    Intake/Output from previous day: 12/06 0701 - 12/07 0700 In: 600 [P.O.:600] Out: 700 [Urine:700] Intake/Output this shift:    General appearance: alert, cooperative and no distress Heart: regular rate and rhythm Lungs: dim in right base Abdomen: benign Extremities: no sig edema Wound: incis healing well  Lab Results:  Recent Labs  03/24/14 0450 03/26/14 0328  WBC 5.9 5.1  HGB 10.2* 9.6*  HCT 31.0* 28.8*  PLT 89* 140*   BMET:  Recent Labs  03/24/14 0450  NA 135*  K 4.1  CL 99  CO2 23  GLUCOSE 95  BUN 16  CREATININE 0.94  CALCIUM 9.3    PT/INR: No results for input(s): LABPROT, INR in the last 72 hours. ABG    Component Value Date/Time   PHART 7.327* 03/21/2014 2009   HCO3 20.6 03/21/2014 2009   TCO2 20 03/22/2014 1750   ACIDBASEDEF 5.0* 03/21/2014 2009   O2SAT 92.0 03/21/2014 2009   CBG (last 3)   Recent Labs  03/25/14 1639 03/25/14 2107 03/26/14 0645  GLUCAP 119* 121* 84    Meds Scheduled  Meds: . allopurinol  300 mg Oral Daily  . amLODipine  5 mg Oral Daily  . antiseptic oral rinse  7 mL Mouth Rinse BID  . aspirin EC  325 mg Oral Daily  . atorvastatin  80 mg Oral Daily  . docusate sodium  200 mg Oral Daily  . folic acid  1 mg Oral Daily  . insulin aspart  0-24 Units Subcutaneous TID AC & HS  . metoprolol tartrate  50 mg Oral BID   Or  . metoprolol tartrate  50 mg Per Tube BID  . multivitamin with minerals  1 tablet Oral Daily  . pantoprazole  40 mg Oral Daily  . sodium chloride  3 mL Intravenous Q12H  . thiamine  100 mg Oral Daily   Continuous Infusions: . sodium chloride Stopped (03/23/14 1200)  . phenylephrine (NEO-SYNEPHRINE) Adult infusion Stopped (03/21/14 1800)   PRN Meds:.sodium chloride, bisacodyl **OR** bisacodyl, ondansetron **OR** ondansetron (ZOFRAN) IV, oxyCODONE, sodium chloride, traMADol  Xrays No results found.  Assessment/Plan: S/P Procedure(s) (LRB): CORONARY ARTERY BYPASS GRAFTING (CABG) TIMES FIVE USING LEFT INTERNAL MAMMARY TO LAD, SAPHENOUS VEIN GRAFT TO THE PD/PL, SAPHENOUS VEIN GRAFT TO DIAGONAL 1 AND 2 SEQUENTIALLY, SAPHENOUS VEIN HARVESTED ENDOSCOPICALLY (N/A) TRANSESOPHAGEAL ECHOCARDIOGRAM (TEE) (N/A)  1 will give stronger laxative 2 obtain PT consult to assess safety with ambulation 3 d/c epw's  4 poss home soon   LOS: 6 days    Boyle,Robert E 03/26/2014  Patient neuro intact, co constipation to get lactulose this am PT to evaluate shuffling gate and need for outpatient PT Wires out today Poss. Home in am I have seen and examined Robert Boyle and agree with the above assessment  and plan.  Grace Isaac MD Beeper [phone] Office [phone] 03/26/2014 8:43 AM

## 2014-03-26 NOTE — Plan of Care (Signed)
Problem: Surgery Discharge Goal: Activity appropriate for discharge plan Outcome: Progressing

## 2014-03-26 NOTE — Discharge Summary (Signed)
Physician Discharge Summary  Patient ID: Robert Boyle MRN: [id number] DOB/AGE: 05/12/1935 78 y.o.  Admit date: 03/20/2014 Discharge date: 03/27/2014  Admission Diagnoses: Class IV unstable angina  Discharge Diagnoses:  Principal Problem:   Angina, class IV Active Problems:   CAD STATUS post bare-metal stent to diagonal 2000 (AVE660 2.5x18), treated for in stent restenosis 2001   Hyperlipidemia with target LDL less than 70   Essential hypertension   Abnormal nuclear stress test   S/P CABG x 5: LIMA-LAD, SVG-D1-D2, SVG-RPDA-RPL   CAD, multiple vessel   Patient Active Problem List   Diagnosis Date Noted  . S/P CABG x 5: LIMA-LAD, SVG-D1-D2, SVG-RPDA-RPL 03/21/2014  . CAD (coronary artery disease) 03/20/2014  . CAD, multiple vessel 03/20/2014  . Angina, class IV 03/13/2014  . Abnormal nuclear stress test 03/13/2014  . Other fatigue 03/13/2014  . Weight gain 03/13/2014  . Prostate cancer   . Gout 12/19/2012  . CAD STATUS post bare-metal stent to diagonal 2000 (AVE660 2.5x18), treated for in stent restenosis 2001 10/17/1998  . Hyperlipidemia with target LDL less than 70 10/17/1998  . Essential hypertension 09/19/1998   History of Present Illness: At time of consultation This is a 78 year old Caucasian male with cardiac risk factors that include history of CAD (that includes PCI with BMS to Diagonal 1 in 2000, ISR of Diagonal 1), hyperlipidemia, hypertension, and remote tobacco abuse who presented with complaints of worsening shortness of breath. He is a former patient and neighbor of Dr. Aldona Bar, who has seen Dr. Sallyanne Kuster once since Dr. Eddie Dibbles retirement on 12/13/2012. Repeat catheterization one year later (after BMS stent placed) showed an in-stent restenosis treated. This was treated with PTCA with a 2.5 mm balloon. Since then, he has not had any significant anginal symptoms. He finished undergoing radiation treatment this past July for prostate cancer and  subsequently, has not been seen yet this year by Dr. Sallyanne Kuster.  Over the past month or so, he has noted progressively worsening exertional dyspnea. This is most notable when he goes up a flight of steps or up a hill. His exertional shortness of breath has worsened to the point where now walking in from the waiting room to the clinical room made him profoundly dyspneic. This past weekend, he was walking back to his car from a sporting event and had to walk up a slight hill. He was unable to do so and had a car drive down to get him. At that particular time, he noticed a little discomfort (not chest pain) in his chest but for the most part, his primary symtom is exertional dyspnea. He is not wanting to play golf in the last 2 months because of the dyspnea. He denies any PND, orthopnea,or any significant edema. He was eating dinner last month with Dr. Rex Kras, who noted dyspnea while eating his meal. Based on Dr. Eddie Dibbles recommendations, he went to see his PCP Dr. Melford Aase who recommended that he set up appointment to see Dr. Sallyanne Kuster for dyspnea on exertion. The recommendation was made that he go ahead and have a stress test done prior to seeing Dr. Sallyanne Kuster. Stress test was performed on November 11th and was read as INTERMEDIATE RISK with new ischemia in the inferior-inferolateral region. This was read as being a small in size moderate intensity region;however, on Dr. Allison Quarry evaluation, the risk appeared in more moderate to large in size with moderate intensity partially reversible defect. He denies any irregular heartbeat,palpitations, lightheadedness, dizziness, syncope,near syncope, TIA, or amaurosis  fugax symptoms.  He underwent a cardiac catheterization by Dr. Ellyn Hack today and was found to have multivessel coronary artery disease (please see cath results below) with a preserved LVEF. Cardiac Surgery was consulted for the consideration of coronary artery bypass grafting surgery. Currently, the  patient has no shortness of breath or chest pain. Vital signs are stable.  Past Medical History  Diagnosis Date  . Essential hypertension   . Hyperlipidemia with target LDL less than 70   . CAD S/P percutaneous coronary angioplasty 2000; 2001    a) BMS to D1 75% stenosis (~2.5 mm x 18 mm AVE BMS), mid LAD ~40-50%, mRCA 30-40%, small Cx;; b) 75% ISR of D1 stent - PTCA with 2.5 mm x 15 mm balloon;; c) Myoview 7/'13: EF 63% is inferoseptal gut attenuation artifact but no reversible ischemia  . Gastrointestinal complaints   . Prostate cancer 05/23/13    gleason 4+3=7, vloume 34.32 cc; s/p Chemo-XRT  . History of radiation therapy 09/06/13- 11/02/13    prostate 7600 cGy 40 sessions, seminal vesicles 5600 cGy 40 sessions  . Gout   . Arthritis   . GERD (gastroesophageal reflux disease)   . H/O renal calculi     Past Surgical History  Procedure Laterality Date  . Coronary angioplasty with stent placement  10/17/1998    PCI with ~2.5 mm x 18 mm AVE BMS to D1  . Coronary angioplasty  07/21/1999    PTCA to ISR stenosis to LAD  . Nm myocar perf wall motion  11/06/2011    low risk 63% ; FIXED INFEROSEPTAL GUT ATTENUATION  . Prostate biopsy  05/23/13    gleason 4+3=7, vol 34.32 cc  . Cholecystectomy  01/22/10  . Back surgery  1995    lumbar  Appendectomy  History  Smoking status  . Former Smoker -- 2.00 packs/day for 25 years  . Quit date: 04/20/1983   History  Alcohol Use  . Yes    Comment: 3 a day    History   Social History  . Marital Status: Single    Spouse Name: N/A    Number of Children: N/A  . Years of Education: N/A   Occupational History  . Retired   Social History Main Topics  . Smoking status: Former Smoker -- 2.00 packs/day for 25 years    Quit date: 04/20/1983  . Smokeless tobacco: Not on file  . Alcohol Use: Yes      Comment: 3 Scotch per day  . Drug Use: No  . Sexual Activity: Not on file    Social History Narrative   Retired.    Divorced, but lives with his ex-wife.   Plays golf with his friends and sons.   Allergies: No Known Drug Allergies   Discharged Condition: good  Hospital Course: The patient was admitted and underwent cardiac catheterization by Dr. Glenetta Hew. As he had significant three-vessel coronary artery disease and symptoms of shortness of breath with exertion as well as positive stress test recommendations were made for CABG. He was seen in cardiothoracic surgical consultation by Dr. Lanelle Bal. Dr. Servando Snare evaluated the patient and his studies and agree with recommendations to proceed with coronary artery surgical revascularization.  Treatments: {DATE OF PROCEDURE: 03/21/2014    OPERATIVE REPORT   PREOPERATIVE DIAGNOSIS: New-onset angina with three-vessel coronary artery disease and positive stress test.  POSTOPERATIVE DIAGNOSIS: New-onset angina with three-vessel coronary artery disease and positive stress test.  SURGICAL PROCEDURE: Coronary artery bypass grafting x5 with the left internal  mammary to the left anterior descending coronary artery, sequential reverse saphenous vein graft to the first and second diagonal coronary artery, sequential reverse saphenous vein graft to the posterior descending and posterolateral branches of the right coronary artery with right thigh and calf endo vein harvesting.  SURGEON: Lanelle Bal, MD  FIRST ASSISTANT: Lars Pinks, PA The patient tolerated the procedure well was taken to the surgical intensive care unit in stable condition.  Post operative hospital course: The patient has remained overall hemodynamically stable. He has had some tachycardia and beta blocker has been adjusted. His blood pressure has also been somewhat elevated but has also improved  with manipulation of medications. He has been started on an ACE inhibitor. He has had some mild volume overload but is responding well to diuretics. He has a mild acute blood loss anemia which is stable. He was started on Trinsicon.He is tolerating gradually increasing activities using standard protocols. He has had some imbalance with ambulation and a physical therapy consult is being obtained to assess safety and need for further outpatient physical therapy. Incisions are noted to be healing well without evidence of infection. He is tolerating diet. Oxygen has been weaned and he is maintaining good saturations on room air. He is maintaining sinus rhythm and has had no significant cardiac dysrhythmias or ectopy. He is felt surgically stable for discharge today.  Discharge Exam: Blood pressure 123/60, pulse 87, temperature 98.7 F (37.1 C), temperature source Oral, resp. rate 18, height 6\' 1"  (1.854 m), weight 208 lb 4.8 oz (94.484 kg), SpO2 93 %.  Physical Exam: Cardiovascular: RRR, no murmurs, gallops, or rubs. Pulmonary: Slightly decreased at bases; no rales, wheezes, or rhonchi. Abdomen: Soft, non tender, bowel sounds present. Extremities: Trace bilateral lower extremity edema. Wounds: Clean and dry. No erythema or signs of infection. Disposition:  discharged home in good condition     Medication List    STOP taking these medications        celecoxib 200 MG capsule  Commonly known as:  CELEBREX     isosorbide mononitrate 30 MG 24 hr tablet  Commonly known as:  IMDUR     metoprolol succinate 25 MG 24 hr tablet  Commonly known as:  TOPROL XL     nitroGLYCERIN 0.4 MG SL tablet  Commonly known as:  NITROSTAT      TAKE these medications        allopurinol 300 MG tablet  Commonly known as:  ZYLOPRIM  Take 300 mg by mouth daily.     alprazolam 2 MG tablet  Commonly known as:  XANAX  Take 1 tablet (2 mg total) by mouth at bedtime.     amLODipine 5 MG tablet  Commonly known  as:  NORVASC  Take 5 mg by mouth daily.     aspirin 325 MG EC tablet  Take 325 mg by mouth daily.     atorvastatin 80 MG tablet  Commonly known as:  LIPITOR  Take 80 mg by mouth daily.     ferrous [id number] C-folic acid capsule  Commonly known as:  TRINSICON / FOLTRIN  Take 1 capsule by mouth daily with breakfast. For one month then stop.  Start taking on:  03/28/2014     furosemide 20 MG tablet  Commonly known as:  LASIX  Take 20 mg by mouth daily.     lisinopril 10 MG tablet  Commonly known as:  PRINIVIL,ZESTRIL  Take 1 tablet (10 mg total) by mouth daily.  metoprolol 50 MG tablet  Commonly known as:  LOPRESSOR  Take 1 tablet (50 mg total) by mouth 2 (two) times daily.     oxyCODONE 5 MG immediate release tablet  Commonly known as:  Oxy IR/ROXICODONE  Take 1-2 tablets (5-10 mg total) by mouth every 4 (four) hours as needed for severe pain.        The patient has been discharged on:   1.Beta Blocker:  Yes [ x  ]                              No   [   ]                              If No, reason:  2.Ace Inhibitor/ARB: Yes [  x ]                                     No  [    ]                                     If No, reason:  3.Statin:   Yes [ x  ]                  No  [   ]                  If No, reason:  4.Ecasa:  Yes  [ x  ]                  No   [   ]                  If No, reason:  Follow-up Information    Follow up with Grace Isaac, MD.   Specialty:  Cardiothoracic Surgery   Why:  04/26/2014 at 2 PM to see the surgeon. Please obtain a chest x-ray at [facility] at 1 PM. [facility] imaging is located in the same office complex.   Contact information:   [address] [place] [address] [phone]       Follow up with SIMMONS, BRITTAINY, PA-C.   Specialty:  Cardiology   Why:  04/19/2014 at 9 am for cardiology consult   Contact information:   [address] [place]  [address] [phone]       Signed: Nani Skillern PA-C 03/27/2014, 8:25 AM

## 2014-03-27 LAB — GLUCOSE, CAPILLARY
GLUCOSE-CAPILLARY: 101 mg/dL — AB (ref 70–99)
Glucose-Capillary: 88 mg/dL (ref 70–99)

## 2014-03-27 MED ORDER — FE FUMARATE-B12-VIT C-FA-IFC PO CAPS
1.0000 | ORAL_CAPSULE | Freq: Every day | ORAL | Status: DC
  Filled 2014-03-27: qty 1

## 2014-03-27 MED ORDER — METOPROLOL TARTRATE 50 MG PO TABS
50.0000 mg | ORAL_TABLET | Freq: Two times a day (BID) | ORAL | Status: DC
  Filled 2014-03-27 (×2): qty 1

## 2014-03-27 MED ORDER — LISINOPRIL 10 MG PO TABS: 10.0000 mg | ORAL_TABLET | Freq: Every day | ORAL | Status: DC

## 2014-03-27 MED ORDER — METOPROLOL TARTRATE 50 MG PO TABS: 50.0000 mg | ORAL_TABLET | Freq: Two times a day (BID) | ORAL | Status: DC

## 2014-03-27 MED ORDER — OXYCODONE HCL 5 MG PO TABS: 5.0000 mg | ORAL_TABLET | ORAL | Status: DC | PRN

## 2014-03-27 MED ORDER — METOPROLOL TARTRATE 25 MG/10 ML ORAL SUSPENSION
50.0000 mg | Freq: Two times a day (BID) | ORAL | Status: DC
  Administered 2014-03-27: 50 mg
  Filled 2014-03-27 (×2): qty 20

## 2014-03-27 MED ORDER — FE FUMARATE-B12-VIT C-FA-IFC PO CAPS: 1.0000 | ORAL_CAPSULE | Freq: Every day | ORAL | Status: DC

## 2014-03-27 NOTE — Progress Notes (Signed)
Ed completed with family present. Voiced understanding. Requests his name be sent to [place]. [place], ACSM 11:31 AM 03/27/2014

## 2014-03-27 NOTE — Progress Notes (Signed)
Subjective:  No CP, mild SOB. POD # 6 CABG X 5  Objective:  Temp:  [97.8 F (36.6 C)-98.7 F (37.1 C)] 98.7 F (37.1 C) (12/08 0407) Pulse Rate:  [87-113] 113 (12/08 0837) Resp:  [18-20] 18 (12/08 0407) BP: (119-154)/(57-68) 144/60 mmHg (12/08 0837) SpO2:  [92 %-96 %] 95 % (12/08 0837) Weight:  [208 lb 4.8 oz (94.484 kg)] 208 lb 4.8 oz (94.484 kg) (12/08 0407) Weight change: -1 lb 8 oz (-0.68 kg)  Intake/Output from previous day: 12/07 0701 - 12/08 0700 In: 240 [P.O.:240] Out: -   Intake/Output from this shift: Total I/O In: 240 [P.O.:240] Out: 300 [Urine:300]  Physical Exam: General appearance: alert and no distress Neck: no adenopathy, no carotid bruit, no JVD, supple, symmetrical, trachea midline and thyroid not enlarged, symmetric, no tenderness/mass/nodules Lungs: clear to auscultation bilaterally Heart: regular rate and rhythm, S1, S2 normal, no murmur, click, rub or gallop Extremities: extremities normal, atraumatic, no cyanosis or edema  Lab Results: Results for orders placed or performed during the hospital encounter of 03/20/14 (from the past 48 hour(s))  Glucose, capillary     Status: None   Collection Time: 03/25/14 11:36 AM  Result Value Ref Range   Glucose-Capillary 99 70 - 99 mg/dL   Comment 1 Notify RN   Glucose, capillary     Status: Abnormal   Collection Time: 03/25/14  4:09 PM  Result Value Ref Range   Glucose-Capillary 134 (H) 70 - 99 mg/dL  Glucose, capillary     Status: Abnormal   Collection Time: 03/25/14  4:39 PM  Result Value Ref Range   Glucose-Capillary 119 (H) 70 - 99 mg/dL  Glucose, capillary     Status: Abnormal   Collection Time: 03/25/14  9:07 PM  Result Value Ref Range   Glucose-Capillary 121 (H) 70 - 99 mg/dL  CBC     Status: Abnormal   Collection Time: 03/26/14  3:28 AM  Result Value Ref Range   WBC 5.1 4.0 - 10.5 K/uL   RBC 3.05 (L) 4.22 - 5.81 MIL/uL   Hemoglobin 9.6 (L) 13.0 - 17.0 g/dL   HCT 28.8 (L) 39.0 - 52.0  %   MCV 94.4 78.0 - 100.0 fL   MCH 31.5 26.0 - 34.0 pg   MCHC 33.3 30.0 - 36.0 g/dL   RDW 14.4 11.5 - 15.5 %   Platelets 140 (L) 150 - 400 K/uL    Comment: DELTA CHECK NOTED REPEATED TO VERIFY SPECIMEN CHECKED FOR CLOTS   Glucose, capillary     Status: None   Collection Time: 03/26/14  6:45 AM  Result Value Ref Range   Glucose-Capillary 84 70 - 99 mg/dL  Glucose, capillary     Status: None   Collection Time: 03/26/14 11:11 AM  Result Value Ref Range   Glucose-Capillary 94 70 - 99 mg/dL  Glucose, capillary     Status: Abnormal   Collection Time: 03/26/14  4:12 PM  Result Value Ref Range   Glucose-Capillary 115 (H) 70 - 99 mg/dL  Glucose, capillary     Status: Abnormal   Collection Time: 03/26/14  9:03 PM  Result Value Ref Range   Glucose-Capillary 115 (H) 70 - 99 mg/dL   Comment 1 Documented in Chart    Comment 2 Notify RN   Glucose, capillary     Status: None   Collection Time: 03/27/14  6:16 AM  Result Value Ref Range   Glucose-Capillary 88 70 - 99 mg/dL   Comment  1 Documented in Chart    Comment 2 Notify RN     Imaging: Imaging results have been reviewed  Tele: NSR  Assessment/Plan:   1. Principal Problem: 2.   Angina, class IV 3. Active Problems: 4.   CAD STATUS post bare-metal stent to diagonal 2000 (AVE660 2.5x18), treated for in stent restenosis 2001 5.   Hyperlipidemia with target LDL less than 70 6.   Essential hypertension 7.   Abnormal nuclear stress test 8.   S/P CABG x 5: LIMA-LAD, SVG-D1-D2, SVG-RPDA-RPL 9.   CAD, multiple vessel 10.   Time Spent Directly with Patient:  15 minutes  Length of Stay:  LOS: 7 days   POD # 6 CABG X 5. Looks good. RRR, Exam benign. Labs OK. Going home today per [facility]. OP [facility]. Will need ROV with Dr. Ellyn Hack.   Kianah Harries J 03/27/2014, 11:05 AM

## 2014-03-27 NOTE — Progress Notes (Signed)
Pt discharged per MD order and protocol. Discharge instructions reviewed with patient and all questions answered. Pt given all prescriptions and aware of follow up appointments.  

## 2014-03-27 NOTE — Progress Notes (Addendum)
      [address]       [place] [address]             [phone]        6 Days Post-Op Procedure(s) (LRB): CORONARY ARTERY BYPASS GRAFTING (CABG) TIMES FIVE USING LEFT INTERNAL MAMMARY TO LAD, SAPHENOUS VEIN GRAFT TO THE PD/PL, SAPHENOUS VEIN GRAFT TO DIAGONAL 1 AND 2 SEQUENTIALLY, SAPHENOUS VEIN HARVESTED ENDOSCOPICALLY (N/A) TRANSESOPHAGEAL ECHOCARDIOGRAM (TEE) (N/A)  Subjective: Patient eating breakfast. Had 2 bowel movements. Feels fairly good and wants to go home.  Objective: Vital signs in last 24 hours: Temp:  [97.8 F (36.6 C)-98.7 F (37.1 C)] 98.7 F (37.1 C) (12/08 0407) Pulse Rate:  [87-106] 87 (12/08 0407) Cardiac Rhythm:  [-] Normal sinus rhythm (12/07 2020) Resp:  [18-20] 18 (12/08 0407) BP: (119-168)/(57-68) 123/60 mmHg (12/08 0407) SpO2:  [92 %-96 %] 93 % (12/08 0407) Weight:  [208 lb 4.8 oz (94.484 kg)] 208 lb 4.8 oz (94.484 kg) (12/08 0407)  Pre op weight 95 kg Current Weight  03/27/14 208 lb 4.8 oz (94.484 kg)      Intake/Output from previous day: 12/07 0701 - 12/08 0700 In: 240 [P.O.:240] Out: -    Physical Exam:  Cardiovascular: RRR, no murmurs, gallops, or rubs. Pulmonary: Slightly decreased at bases; no rales, wheezes, or rhonchi. Abdomen: Soft, non tender, bowel sounds present. Extremities: Trace bilateral lower extremity edema. Wounds: Clean and dry.  No erythema or signs of infection.  Lab Results: CBC: Recent Labs  03/26/14 0328  WBC 5.1  HGB 9.6*  HCT 28.8*  PLT 140*   BMET: No results for input(s): NA, K, CL, CO2, GLUCOSE, BUN, CREATININE, CALCIUM in the last 72 hours.  PT/INR:  Lab Results  Component Value Date   INR 1.32 03/21/2014   INR 1.01 03/20/2014   INR 0.95 03/12/2014   ABG:  INR: Will add last result for INR, ABG once components are confirmed Will add last 4 CBG results once components are confirmed  Assessment/Plan:  1. CV - ST in the low 100's. On Norvasc 5 daily, Lisinopril 10  daily, and Lopressor 50 bid. Will give Lopressor now. 2.  Pulmonary - Encourage incentive spirometer 3. Volume Overload - On Lasix 40 daily 4.  Acute blood loss anemia - H and H decreased to 9.6 and 28.8. Start Trinsicon 5. Mild thrombocytopenia-platelets up to 140,000 6. PT evaluated yesterday-recommends HH, home PT, rolling walker 7. Likely discharge today  ZIMMERMAN,DONIELLE MPA-C 03/27/2014,8:00 AM  Plan d/c today with home pt I have seen and examined [name] and agree with the above assessment  and plan.  Grace Isaac MD [facility] [phone] Office [phone] 03/27/2014 9:57 AM

## 2014-03-27 NOTE — Progress Notes (Signed)
UR Completed.  [phone]  

## 2014-03-27 NOTE — Progress Notes (Signed)
NCM spoke to pt and gave permission to speak to wife, Asher Muir. Offered choice for [facility]. Wife requested [facility] for [facility]. Contacted [employer] for RW. Faxed orders and facesheet to [facility]. They will set arrangements with family to deliver RW to home. Made wife aware. Jonnie Finner RN CCM Case Mgmt phone [phone]

## 2014-03-27 NOTE — Progress Notes (Signed)
CARE MANAGEMENT NOTE 03/27/2014  Patient:  Robert Boyle, Robert Boyle   Account Number:  [id number]  Date Initiated:  03/26/2014  Documentation initiated by:  [facility]  Subjective/Objective Assessment:   s/p CABG     Action/Plan:   home with wife, Robert Boyle   Anticipated DC Date:  03/27/2014   Anticipated DC Plan:  [place]  CM consult      Choice offered to / List presented to:             Status of service:  Completed, signed off Medicare Important Message given?  YES (If response is "NO", the following Medicare IM given date fields will be blank) Date Medicare IM given:  03/26/2014 Medicare IM given by:  [facility] Date Additional Medicare IM given:   Additional Medicare IM given by:    Discharge Disposition:  HOME/SELF CARE  Per UR Regulation:    If discussed at Long Length of Stay Meetings, dates discussed:    Comments:  03/27/2014 10:58 AM NCM spoke to pt and gave permission to speak to wife, Robert Boyle. Offered choice for [facility]. Wife requested [facility] for [facility]. Contacted [employer] for RW. Faxed orders and facesheet to [facility]. They will set arrangements with family to deliver RW to home. Made wife aware. Notified [facility] [place] [place] for scheduled dc home today.  Jonnie Finner RN CCM Case Mgmt phone [phone]    03/26/2014 1530 NCM spoke to pt and states he needs a RW for home. Will need signed DME order from attending. Will contact [employer] for RW on 12/7. Jonnie Finner RN CCM Case Mgmt phone [phone]

## 2014-03-28 ENCOUNTER — Telehealth: Payer: Self-pay | Admitting: Cardiology

## 2014-03-28 NOTE — Telephone Encounter (Signed)
Chance called in stating that he needs a date for the pt's prescription for Valium

## 2014-03-28 NOTE — Telephone Encounter (Signed)
RN SPOKE TO PHARMACIST RN INFORMED PHARMACIST WILL HAVE TO CONTACT DR Humphreys WITH THE ANSWER.   RN SPOKE TO DR Ellyn Hack. PER DR HARDING VALIUM  PRESCRIPTION WRITTEN ON 03/20/14 WITH NO REFILL PER DR HARDING.  RN CALLED NOTIFIED PHARMACY AND SPOKE TO Lars Mage

## 2014-03-29 ENCOUNTER — Encounter (HOSPITAL_COMMUNITY): Payer: Self-pay | Admitting: Cardiology

## 2014-04-02 ENCOUNTER — Other Ambulatory Visit: Payer: Self-pay | Admitting: *Deleted

## 2014-04-02 ENCOUNTER — Telehealth: Payer: Self-pay | Admitting: *Deleted

## 2014-04-02 DIAGNOSIS — G8918 Other acute postprocedural pain: Secondary | ICD-10-CM

## 2014-04-02 MED ORDER — OXYCODONE HCL 5 MG PO TABS: 5.0000 mg | ORAL_TABLET | ORAL | Status: DC | PRN

## 2014-04-02 NOTE — Telephone Encounter (Signed)
Signed order for cardiac rehab faxed to [facility]. 

## 2014-04-02 NOTE — Telephone Encounter (Signed)
Robert Boyle has called with request for a pain med refill for Oxycodone s/p CABG 03/21/14. I informed him that a refill would be available for pick up from our office today and he agreed.

## 2014-04-09 ENCOUNTER — Encounter: Payer: Self-pay | Admitting: Cardiothoracic Surgery

## 2014-04-09 DIAGNOSIS — Z48812 Encounter for surgical aftercare following surgery on the circulatory system: Secondary | ICD-10-CM

## 2014-04-13 ENCOUNTER — Inpatient Hospital Stay (HOSPITAL_COMMUNITY)
Admission: EM | Admit: 2014-04-13 | Discharge: 2014-04-18 | DRG: 176 | Disposition: A | Discharge status: Home Health | Payer: Medicare HMO | Attending: Internal Medicine | Admitting: Internal Medicine

## 2014-04-13 ENCOUNTER — Encounter (HOSPITAL_COMMUNITY): Payer: Self-pay | Admitting: *Deleted

## 2014-04-13 DIAGNOSIS — Z8249 Family history of ischemic heart disease and other diseases of the circulatory system: Secondary | ICD-10-CM

## 2014-04-13 DIAGNOSIS — R7989 Other specified abnormal findings of blood chemistry: Secondary | ICD-10-CM | POA: Diagnosis present

## 2014-04-13 DIAGNOSIS — M549 Dorsalgia, unspecified: Secondary | ICD-10-CM

## 2014-04-13 DIAGNOSIS — N179 Acute kidney failure, unspecified: Secondary | ICD-10-CM | POA: Diagnosis present

## 2014-04-13 DIAGNOSIS — R0602 Shortness of breath: Secondary | ICD-10-CM

## 2014-04-13 DIAGNOSIS — I2510 Atherosclerotic heart disease of native coronary artery without angina pectoris: Secondary | ICD-10-CM | POA: Diagnosis present

## 2014-04-13 DIAGNOSIS — K219 Gastro-esophageal reflux disease without esophagitis: Secondary | ICD-10-CM | POA: Diagnosis present

## 2014-04-13 DIAGNOSIS — E785 Hyperlipidemia, unspecified: Secondary | ICD-10-CM | POA: Diagnosis present

## 2014-04-13 DIAGNOSIS — Z8546 Personal history of malignant neoplasm of prostate: Secondary | ICD-10-CM

## 2014-04-13 DIAGNOSIS — I2699 Other pulmonary embolism without acute cor pulmonale: Secondary | ICD-10-CM | POA: Diagnosis not present

## 2014-04-13 DIAGNOSIS — I959 Hypotension, unspecified: Secondary | ICD-10-CM | POA: Diagnosis present

## 2014-04-13 DIAGNOSIS — Z7982 Long term (current) use of aspirin: Secondary | ICD-10-CM

## 2014-04-13 DIAGNOSIS — Z923 Personal history of irradiation: Secondary | ICD-10-CM

## 2014-04-13 DIAGNOSIS — Z87891 Personal history of nicotine dependence: Secondary | ICD-10-CM

## 2014-04-13 DIAGNOSIS — M109 Gout, unspecified: Secondary | ICD-10-CM | POA: Diagnosis present

## 2014-04-13 DIAGNOSIS — Z823 Family history of stroke: Secondary | ICD-10-CM

## 2014-04-13 DIAGNOSIS — Z951 Presence of aortocoronary bypass graft: Secondary | ICD-10-CM

## 2014-04-13 DIAGNOSIS — I10 Essential (primary) hypertension: Secondary | ICD-10-CM | POA: Diagnosis present

## 2014-04-13 HISTORY — DX: Other pulmonary embolism without acute cor pulmonale: I26.99

## 2014-04-13 NOTE — ED Notes (Signed)
Pt.had a quintuple bypass 3 weeks ago at [facility]. Today at 330pm pt. Started having mid lower 7/10 sharp back pain. Pt. hasnt had a BM in 3 days. Pt. Is experiencing SOB with exertion

## 2014-04-14 ENCOUNTER — Encounter (HOSPITAL_COMMUNITY): Payer: Self-pay | Admitting: Radiology

## 2014-04-14 ENCOUNTER — Emergency Department (HOSPITAL_COMMUNITY): Payer: Medicare HMO

## 2014-04-14 DIAGNOSIS — Z86711 Personal history of pulmonary embolism: Secondary | ICD-10-CM | POA: Insufficient documentation

## 2014-04-14 DIAGNOSIS — Z951 Presence of aortocoronary bypass graft: Secondary | ICD-10-CM | POA: Diagnosis not present

## 2014-04-14 DIAGNOSIS — Z7982 Long term (current) use of aspirin: Secondary | ICD-10-CM | POA: Diagnosis not present

## 2014-04-14 DIAGNOSIS — Z823 Family history of stroke: Secondary | ICD-10-CM | POA: Diagnosis not present

## 2014-04-14 DIAGNOSIS — I10 Essential (primary) hypertension: Secondary | ICD-10-CM | POA: Diagnosis present

## 2014-04-14 DIAGNOSIS — Z87891 Personal history of nicotine dependence: Secondary | ICD-10-CM | POA: Diagnosis not present

## 2014-04-14 DIAGNOSIS — M109 Gout, unspecified: Secondary | ICD-10-CM | POA: Diagnosis present

## 2014-04-14 DIAGNOSIS — I2699 Other pulmonary embolism without acute cor pulmonale: Principal | ICD-10-CM

## 2014-04-14 DIAGNOSIS — I959 Hypotension, unspecified: Secondary | ICD-10-CM | POA: Diagnosis present

## 2014-04-14 DIAGNOSIS — Z8249 Family history of ischemic heart disease and other diseases of the circulatory system: Secondary | ICD-10-CM | POA: Diagnosis not present

## 2014-04-14 DIAGNOSIS — Z923 Personal history of irradiation: Secondary | ICD-10-CM | POA: Diagnosis not present

## 2014-04-14 DIAGNOSIS — K219 Gastro-esophageal reflux disease without esophagitis: Secondary | ICD-10-CM | POA: Diagnosis present

## 2014-04-14 DIAGNOSIS — I2510 Atherosclerotic heart disease of native coronary artery without angina pectoris: Secondary | ICD-10-CM | POA: Diagnosis present

## 2014-04-14 DIAGNOSIS — R0602 Shortness of breath: Secondary | ICD-10-CM | POA: Diagnosis present

## 2014-04-14 DIAGNOSIS — N179 Acute kidney failure, unspecified: Secondary | ICD-10-CM | POA: Diagnosis present

## 2014-04-14 DIAGNOSIS — E785 Hyperlipidemia, unspecified: Secondary | ICD-10-CM | POA: Diagnosis present

## 2014-04-14 DIAGNOSIS — R7989 Other specified abnormal findings of blood chemistry: Secondary | ICD-10-CM | POA: Diagnosis present

## 2014-04-14 DIAGNOSIS — Z8546 Personal history of malignant neoplasm of prostate: Secondary | ICD-10-CM | POA: Diagnosis not present

## 2014-04-14 LAB — CBC WITH DIFFERENTIAL/PLATELET
BASOS PCT: 0 % (ref 0–1)
Basophils Absolute: 0 10*3/uL (ref 0.0–0.1)
Eosinophils Absolute: 0.1 10*3/uL (ref 0.0–0.7)
Eosinophils Relative: 1 % (ref 0–5)
HEMATOCRIT: 32.5 % — AB (ref 39.0–52.0)
HEMOGLOBIN: 10.8 g/dL — AB (ref 13.0–17.0)
Lymphocytes Relative: 5 % — ABNORMAL LOW (ref 12–46)
Lymphs Abs: 0.7 10*3/uL (ref 0.7–4.0)
MCH: 30.3 pg (ref 26.0–34.0)
MCHC: 33.2 g/dL (ref 30.0–36.0)
MCV: 91.3 fL (ref 78.0–100.0)
Monocytes Absolute: 1.2 10*3/uL — ABNORMAL HIGH (ref 0.1–1.0)
Monocytes Relative: 9 % (ref 3–12)
NEUTROS ABS: 10.7 10*3/uL — AB (ref 1.7–7.7)
NEUTROS PCT: 85 % — AB (ref 43–77)
PLATELETS: 187 10*3/uL (ref 150–400)
RBC: 3.56 MIL/uL — ABNORMAL LOW (ref 4.22–5.81)
RDW: 14.2 % (ref 11.5–15.5)
WBC: 12.6 10*3/uL — AB (ref 4.0–10.5)

## 2014-04-14 LAB — COMPREHENSIVE METABOLIC PANEL
ALBUMIN: 3.1 g/dL — AB (ref 3.5–5.2)
ALT: 28 U/L (ref 0–53)
AST: 23 U/L (ref 0–37)
Alkaline Phosphatase: 109 U/L (ref 39–117)
Anion gap: 12 (ref 5–15)
BILIRUBIN TOTAL: 1.3 mg/dL — AB (ref 0.3–1.2)
BUN: 15 mg/dL (ref 6–23)
CHLORIDE: 100 meq/L (ref 96–112)
CO2: 20 mmol/L (ref 19–32)
CREATININE: 1.1 mg/dL (ref 0.50–1.35)
Calcium: 9.1 mg/dL (ref 8.4–10.5)
GFR calc Af Amer: 73 mL/min — ABNORMAL LOW (ref >90)
GFR calc non Af Amer: 63 mL/min — ABNORMAL LOW (ref >90)
Glucose, Bld: 111 mg/dL — ABNORMAL HIGH (ref 70–99)
Potassium: 4 mmol/L (ref 3.5–5.1)
Sodium: 132 mmol/L — ABNORMAL LOW (ref 135–145)
Total Protein: 6.5 g/dL (ref 6.0–8.3)

## 2014-04-14 LAB — BRAIN NATRIURETIC PEPTIDE: B NATRIURETIC PEPTIDE 5: 82.7 pg/mL (ref 0.0–100.0)

## 2014-04-14 LAB — HEPARIN LEVEL (UNFRACTIONATED)
HEPARIN UNFRACTIONATED: 0.39 [IU]/mL (ref 0.30–0.70)
HEPARIN UNFRACTIONATED: 0.38 [IU]/mL (ref 0.30–0.70)

## 2014-04-14 LAB — TROPONIN I

## 2014-04-14 LAB — MRSA PCR SCREENING: MRSA by PCR: NEGATIVE

## 2014-04-14 MED ORDER — METOPROLOL TARTRATE 50 MG PO TABS
50.0000 mg | ORAL_TABLET | Freq: Two times a day (BID) | ORAL | Status: DC
  Filled 2014-04-14: qty 1

## 2014-04-14 MED ORDER — PANTOPRAZOLE SODIUM 40 MG PO TBEC
40.0000 mg | DELAYED_RELEASE_TABLET | Freq: Every day | ORAL | Status: DC
  Administered 2014-04-14 – 2014-04-18 (×5): 40 mg via ORAL
  Filled 2014-04-14 (×4): qty 1

## 2014-04-14 MED ORDER — DIAZEPAM 2 MG PO TABS: 2.0000 mg | ORAL_TABLET | Freq: Two times a day (BID) | ORAL | Status: DC | PRN

## 2014-04-14 MED ORDER — ALLOPURINOL 300 MG PO TABS: 300.0000 mg | ORAL_TABLET | Freq: Every day | ORAL | Status: DC

## 2014-04-14 MED ORDER — IOHEXOL 350 MG/ML SOLN
75.0000 mL | Freq: Once | INTRAVENOUS | Status: AC | PRN
  Administered 2014-04-14: 75 mL via INTRAVENOUS

## 2014-04-14 MED ORDER — ALLOPURINOL 300 MG PO TABS
300.0000 mg | ORAL_TABLET | Freq: Every day | ORAL | Status: DC
  Administered 2014-04-14 – 2014-04-18 (×5): 300 mg via ORAL
  Filled 2014-04-14 (×5): qty 1

## 2014-04-14 MED ORDER — METOPROLOL TARTRATE 50 MG PO TABS
50.0000 mg | ORAL_TABLET | Freq: Two times a day (BID) | ORAL | Status: DC
  Administered 2014-04-14 – 2014-04-18 (×7): 50 mg via ORAL
  Filled 2014-04-14 (×12): qty 1

## 2014-04-14 MED ORDER — ONDANSETRON HCL 4 MG/2ML IJ SOLN: 4.0000 mg | Freq: Four times a day (QID) | INTRAMUSCULAR | Status: DC | PRN

## 2014-04-14 MED ORDER — PANTOPRAZOLE SODIUM 40 MG PO TBEC: 40.0000 mg | DELAYED_RELEASE_TABLET | Freq: Every day | ORAL | Status: DC

## 2014-04-14 MED ORDER — ASPIRIN EC 325 MG PO TBEC
325.0000 mg | DELAYED_RELEASE_TABLET | Freq: Every day | ORAL | Status: DC
  Administered 2014-04-14 – 2014-04-17 (×4): 325 mg via ORAL
  Filled 2014-04-14 (×4): qty 1

## 2014-04-14 MED ORDER — ASPIRIN EC 325 MG PO TBEC: 325.0000 mg | DELAYED_RELEASE_TABLET | Freq: Every day | ORAL | Status: DC

## 2014-04-14 MED ORDER — ATORVASTATIN CALCIUM 80 MG PO TABS
80.0000 mg | ORAL_TABLET | Freq: Every day | ORAL | Status: DC
  Administered 2014-04-14 – 2014-04-18 (×5): 80 mg via ORAL
  Filled 2014-04-14 (×5): qty 1

## 2014-04-14 MED ORDER — AMLODIPINE BESYLATE 5 MG PO TABS
5.0000 mg | ORAL_TABLET | Freq: Every day | ORAL | Status: DC
  Administered 2014-04-14: 5 mg via ORAL
  Filled 2014-04-14 (×2): qty 1

## 2014-04-14 MED ORDER — MORPHINE SULFATE 2 MG/ML IJ SOLN
2.0000 mg | INTRAMUSCULAR | Status: DC | PRN
  Administered 2014-04-14 – 2014-04-16 (×5): 4 mg via INTRAVENOUS
  Administered 2014-04-16: 2 mg via INTRAVENOUS
  Administered 2014-04-16: 4 mg via INTRAVENOUS
  Administered 2014-04-17 (×2): 2 mg via INTRAVENOUS
  Filled 2014-04-14 (×3): qty 2
  Filled 2014-04-14: qty 1
  Filled 2014-04-14 (×2): qty 2
  Filled 2014-04-14: qty 1
  Filled 2014-04-14: qty 2
  Filled 2014-04-14: qty 1

## 2014-04-14 MED ORDER — MENTHOL 3 MG MT LOZG
1.0000 | LOZENGE | OROMUCOSAL | Status: DC | PRN
  Administered 2014-04-14: 3 mg via ORAL
  Filled 2014-04-14: qty 9

## 2014-04-14 MED ORDER — LISINOPRIL 10 MG PO TABS: 10.0000 mg | ORAL_TABLET | Freq: Every day | ORAL | Status: DC

## 2014-04-14 MED ORDER — ONDANSETRON HCL 4 MG PO TABS: 4.0000 mg | ORAL_TABLET | Freq: Four times a day (QID) | ORAL | Status: DC | PRN

## 2014-04-14 MED ORDER — SODIUM CHLORIDE 0.9 % IJ SOLN
3.0000 mL | Freq: Two times a day (BID) | INTRAMUSCULAR | Status: DC
  Administered 2014-04-14 – 2014-04-18 (×6): 3 mL via INTRAVENOUS

## 2014-04-14 MED ORDER — AMLODIPINE BESYLATE 5 MG PO TABS: 5.0000 mg | ORAL_TABLET | Freq: Every day | ORAL | Status: DC

## 2014-04-14 MED ORDER — HEPARIN (PORCINE) IN NACL 100-0.45 UNIT/ML-% IJ SOLN
1450.0000 [IU]/h | INTRAMUSCULAR | Status: DC
  Administered 2014-04-14: 1450 [IU]/h via INTRAVENOUS
  Filled 2014-04-14 (×4): qty 250

## 2014-04-14 MED ORDER — HEPARIN BOLUS VIA INFUSION
4000.0000 [IU] | Freq: Once | INTRAVENOUS | Status: AC
  Administered 2014-04-14: 4000 [IU] via INTRAVENOUS
  Filled 2014-04-14: qty 4000

## 2014-04-14 MED ORDER — LISINOPRIL 10 MG PO TABS
10.0000 mg | ORAL_TABLET | Freq: Every day | ORAL | Status: DC
  Administered 2014-04-14: 10 mg via ORAL
  Filled 2014-04-14 (×2): qty 1

## 2014-04-14 MED ORDER — ACETAMINOPHEN 325 MG PO TABS: 650.0000 mg | ORAL_TABLET | Freq: Four times a day (QID) | ORAL | Status: DC | PRN

## 2014-04-14 MED ORDER — MORPHINE SULFATE 4 MG/ML IJ SOLN
4.0000 mg | Freq: Once | INTRAMUSCULAR | Status: AC
  Administered 2014-04-14: 4 mg via INTRAVENOUS
  Filled 2014-04-14: qty 1

## 2014-04-14 MED ORDER — OXYCODONE HCL 5 MG PO TABS: 5.0000 mg | ORAL_TABLET | ORAL | Status: DC | PRN

## 2014-04-14 MED ORDER — ACETAMINOPHEN 650 MG RE SUPP: 650.0000 mg | Freq: Four times a day (QID) | RECTAL | Status: DC | PRN

## 2014-04-14 MED ORDER — LIDOCAINE 5 % EX PTCH
1.0000 | MEDICATED_PATCH | CUTANEOUS | Status: DC
  Administered 2014-04-14 – 2014-04-18 (×5): 1 via TRANSDERMAL
  Filled 2014-04-14 (×6): qty 1

## 2014-04-14 NOTE — H&P (Signed)
[facility] History and Physical  Patient: Robert Boyle  ZHY:[id number]  DOB: [birth year]  DOS: the patient was seen and examined on 04/14/2014 PCP: Chesley Noon, MD  Chief Complaint: Chest pain and back pain  HPI: Robert Boyle is a 78 y.o. male with Past medical history of essential hypertension, dyslipidemia, coronary artery disease status post CABG this month, GERD, history of prostate cancer. The patient presented with complaints of low back pain. He mentions 3 days ago he was at his baseline and initially he started with a severe pain on his right hip as well as lower back. The pain lasted for a few minutes and then reoccurred later on. On Thursday he continues to have pain but this time it was on the lower back as well as mid back area. And the the pain was severe enough for him to breathe comfortably. His breathing was also worsened over last 3 days. He denies any dizziness or chest pain denies any fever or chills denies any nausea or vomiting denies any abdominal pain diarrhea or constipation or active bleeding. He denies any swelling in his legs as well. On Friday the pain was progressively worsening and was not improving as well as his breathing was progressively worsening and therefore he decided to come to the hospital. He denies any recent change in his medication. He mentions after the surgery he has been walking 3 times a day around the house but his family thinks that he is not active enough after the surgery.  He sees physical therapy twice a week.  The patient is coming from home And at his baseline independent for most of his ADL.  Review of Systems: as mentioned in the history of present illness.  A Comprehensive review of the other systems is negative.  Past Medical History  Diagnosis Date  . Essential hypertension   . Hyperlipidemia with target LDL less than 70   . CAD S/P percutaneous coronary angioplasty 2000; 2001    a) BMS to D1 75%  stenosis (~2.5 mm x 18 mm AVE BMS), mid LAD ~40-50%, mRCA 30-40%, small Cx;; b) 75% ISR of D1 stent - PTCA with 2.5 mm x 15 mm balloon;; c) Myoview 7/'13: EF 63% is inferoseptal gut attenuation artifact but no reversible ischemia  . Gastrointestinal complaints   . Prostate cancer 05/23/13    gleason 4+3=7, vloume 34.32 cc; s/p Chemo-XRT  . History of radiation therapy 09/06/13- 11/02/13    prostate 7600 cGy 40 sessions, seminal vesicles 5600 cGy 40 sessions  . Gout   . Arthritis   . GERD (gastroesophageal reflux disease)   . H/O renal calculi   . Shortness of breath dyspnea    Past Surgical History  Procedure Laterality Date  . Coronary angioplasty with stent placement  10/17/1998    PCI with ~2.5 mm x 18 mm AVE BMS to D1  . Coronary angioplasty  07/21/1999    PTCA to ISR stenosis to LAD  . Nm myocar perf wall motion  11/06/2011    low risk 63% ; FIXED INFEROSEPTAL GUT ATTENUATION  . Prostate biopsy  05/23/13    gleason 4+3=7, vol 34.32 cc  . Cholecystectomy  01/22/10  . Back surgery  1995    lumbar  . Coronary artery bypass graft N/A 03/21/2014    Procedure: CORONARY ARTERY BYPASS GRAFTING (CABG) TIMES FIVE USING LEFT INTERNAL MAMMARY TO LAD, SAPHENOUS VEIN GRAFT TO THE PD/PL, SAPHENOUS VEIN GRAFT TO DIAGONAL 1 AND 2 SEQUENTIALLY, SAPHENOUS VEIN HARVESTED  ENDOSCOPICALLY;  Surgeon: Grace Isaac, MD;  Location: [facility];  Service: Open Heart Surgery;  Laterality: N/A;  . Tee without cardioversion N/A 03/21/2014    Procedure: TRANSESOPHAGEAL ECHOCARDIOGRAM (TEE);  Surgeon: Grace Isaac, MD;  Location: [place];  Service: Open Heart Surgery;  Laterality: N/A;  . Left heart catheterization with coronary angiogram N/A 03/20/2014    Procedure: LEFT HEART CATHETERIZATION WITH CORONARY ANGIOGRAM;  Surgeon: Leonie Man, MD;  Location: [facility];  Service: Cardiovascular;  Laterality: N/A;   Social History:  reports that he quit smoking about 31 years ago. He has never used smokeless tobacco. He  reports that he drinks alcohol. He reports that he does not use illicit drugs.  No Known Allergies  Family History  Problem Relation Age of Onset  . Heart attack Father   . Stroke Mother   . Heart attack Brother   . Cancer Brother     one brother-prostate    Prior to Admission medications   Medication Sig Start Date End Date Taking? Authorizing Provider  allopurinol (ZYLOPRIM) 300 MG tablet Take 300 mg by mouth daily.   Yes Historical Provider, MD  amLODipine (NORVASC) 5 MG tablet Take 5 mg by mouth daily.   Yes Historical Provider, MD  aspirin 325 MG EC tablet Take 325 mg by mouth daily.   Yes Historical Provider, MD  atorvastatin (LIPITOR) 80 MG tablet Take 80 mg by mouth daily.   Yes Historical Provider, MD  diazepam (VALIUM) 2 MG tablet Take 2 mg by mouth every 12 (twelve) hours as needed for anxiety.   Yes Historical Provider, MD  ferrous [id number] C-folic acid (TRINSICON / FOLTRIN) capsule Take 1 capsule by mouth daily with breakfast. For one month then stop. 03/28/14  Yes Donielle Liston Alba, PA-C  furosemide (LASIX) 20 MG tablet Take 20 mg by mouth daily.   Yes Historical Provider, MD  lisinopril (PRINIVIL,ZESTRIL) 10 MG tablet Take 1 tablet (10 mg total) by mouth daily. 03/27/14  Yes Donielle Liston Alba, PA-C  metoprolol (LOPRESSOR) 50 MG tablet Take 1 tablet (50 mg total) by mouth 2 (two) times daily. 03/27/14  Yes Donielle Liston Alba, PA-C  Multiple Vitamin (MULTIVITAMIN WITH MINERALS) TABS tablet Take 1 tablet by mouth daily.   Yes Historical Provider, MD  omeprazole (PRILOSEC) 20 MG capsule Take 20 mg by mouth daily.   Yes Historical Provider, MD  oxyCODONE (OXY IR/ROXICODONE) 5 MG immediate release tablet Take 1-2 tablets (5-10 mg total) by mouth every 4 (four) hours as needed for severe pain. 04/02/14  Yes Rexene Alberts, MD  alprazolam Duanne Moron) 2 MG tablet Take 1 tablet (2 mg total) by mouth at bedtime. Patient not taking: Reported on 04/13/2014 03/20/14   Leonie Man, MD    Physical Exam: Filed Vitals:   04/14/14 0415 04/14/14 0430 04/14/14 0445 04/14/14 0500  BP: 131/67 126/59 107/60 120/68  Pulse: 115 116 113 114  Resp: 19 24 20 24   Weight:      SpO2: 93% 94% 92% 93%    General: Alert, Awake and Oriented to Time, Place and Person. Appear in mild distress Eyes: PERRL ENT: Oral Mucosa clear moist. Neck: Mild JVD Cardiovascular: S1 and S2 Present, no Murmur, Peripheral Pulses Present The scar appears healing well Respiratory: Bilateral Air entry equal and Decreased, right basal Crackles, no wheezes Abdomen: Bowel Sound  present, Soft and non tender Skin: no Rash Extremities: Trace Pedal edema, no calf tenderness Neurologic: Grossly no focal neuro deficit.  Labs on Admission:  CBC:  Recent Labs Lab 04/14/14 0050  WBC 12.6*  NEUTROABS 10.7*  HGB 10.8*  HCT 32.5*  MCV 91.3  PLT 187    CMP     Component Value Date/Time   NA 132* 04/14/2014 0050   K 4.0 04/14/2014 0050   CL 100 04/14/2014 0050   CO2 20 04/14/2014 0050   GLUCOSE 111* 04/14/2014 0050   BUN 15 04/14/2014 0050   CREATININE 1.10 04/14/2014 0050   CREATININE 1.17 03/12/2014 1026   CALCIUM 9.1 04/14/2014 0050   PROT 6.5 04/14/2014 0050   ALBUMIN 3.1* 04/14/2014 0050   AST 23 04/14/2014 0050   ALT 28 04/14/2014 0050   ALKPHOS 109 04/14/2014 0050   BILITOT 1.3* 04/14/2014 0050   GFRNONAA 63* 04/14/2014 0050   GFRAA 73* 04/14/2014 0050    No results for input(s): LIPASE, AMYLASE in the last 168 hours. No results for input(s): AMMONIA in the last 168 hours.   Recent Labs Lab 04/14/14 0050  TROPONINI <0.03   BNP (last 3 results) No results for input(s): PROBNP in the last 8760 hours.  Radiological Exams on Admission: Dg Chest 2 View  04/14/2014   CLINICAL DATA:  Right-sided abdominal pain for 2 days  EXAM: CHEST  2 VIEW  COMPARISON:  Chest radiograph 03/24/2014  FINDINGS: Sternotomy wires overlie normal cardiac silhouette. There is chronic  elevation the right hemidiaphragm with associated right lower lobe atelectasis. No effusion, infiltrate, or pneumothorax.  IMPRESSION: 1. No clear acute cardiopulmonary findings. 2. Chronic elevation right hemidiaphragm with associated atelectasis.   Electronically Signed   By: [name]   On: 04/14/2014 00:32   Ct Angio Chest Pe W/cm &/or Wo Cm  04/14/2014   CLINICAL DATA:  Acute onset of generalized chest pain and back pain. Shortness of breath. Recent CABG. Initial encounter.  EXAM: CT ANGIOGRAPHY CHEST WITH CONTRAST  TECHNIQUE: Multidetector CT imaging of the chest was performed using the standard protocol during bolus administration of intravenous contrast. Multiplanar CT image reconstructions and MIPs were obtained to evaluate the vascular anatomy.  CONTRAST:  68mL OMNIPAQUE IOHEXOL 350 MG/ML SOLN  COMPARISON:  Chest radiograph performed earlier today at 12:07 a.m.  FINDINGS: There is pulmonary embolus within both main pulmonary arteries, tracking into the pulmonary arteries to all lobes of both lungs. The RV/LV ratio of 1.4 corresponds to right heart strain and concern for submassive pulmonary embolus.  Focal right basilar airspace opacification is most compatible with pulmonary infarct. Scattered blebs are noted at the lung apices. Trace left basilar pleural fluid is noted, with mild associated atelectasis. There is no evidence of pneumothorax. No masses are identified; no abnormal focal contrast enhancement is seen.  A trace pericardial effusion is noted. Diffuse coronary artery calcification is seen. The patient is status post median sternotomy. No mediastinal lymphadenopathy is seen. Scattered calcific atherosclerotic disease is noted along the aortic arch and proximal great vessels. There is mild dilatation of the ascending thoracic aorta, measuring up to 3.8 cm, without evidence of aneurysmal dilatation. No axillary lymphadenopathy is seen. The visualized portions of the thyroid gland  are unremarkable in appearance.  The visualized portions of the liver and spleen are unremarkable. The visualized portions of the pancreas, stomach, adrenal glands and kidneys are within normal limits. Nonspecific perinephric stranding is noted bilaterally. The patient is status post cholecystectomy, with clips noted at the gallbladder fossa.  No acute osseous abnormalities are seen.  Review of the MIP images confirms the above  findings.  IMPRESSION: 1. Pulmonary embolus within both main pulmonary arteries, tracking into the pulmonary arteries to all lobes of both lungs. RV/LV ratio of 1.4 corresponds to right heart strain and concern for submassive pulmonary embolus. 2. Focal right basilar pulmonary infarct noted. 3. Trace left basilar pleural fluid, with mild associated atelectasis. 4. Scattered blebs at the lung apices. 5. Trace pericardial effusion seen. 6. Diffuse coronary artery calcification noted. 7. Mild dilatation of the ascending thoracic aorta to 3.8 cm, without evidence of aneurysmal dilatation.  Critical Value/emergent results were called by telephone at the time of interpretation on 04/14/2014 at 2:40 am to Dr. Delora Fuel, who verbally acknowledged these results.   Electronically Signed   By: [name]   On: 04/14/2014 02:45    EKG: Independently reviewed. sinus tachycardia.  Assessment/Plan Principal Problem:   Acute pulmonary embolism Active Problems:   Essential hypertension   CAD (coronary artery disease)   S/P CABG x 5: LIMA-LAD, SVG-D1-D2, SVG-RPDA-RPL   1. Acute pulmonary embolism  the patient is presenting with complaints of right lower back pain. Due to his recent hospitalization and presentation with tachycardia and a CT scan of the chest was obtained which was showing acute bilateral pulmonary embolism with right heart strain. Patient at present is hemodynamically stable other than sinus tachycardia. He is not hypoxic and is not hypotensive. With this the  patient will be closely monitored in the step down overnight. I would continue him on heparin drip. We will discuss with cardiothoracic surgery. We will update an echocardiogram as well as serial troponin for the patient. Oxygen as needed. Morphine as needed for pain control.  2. recent CABG. Continue with aspirin. Discuss with cardiology thoracic surgery for further management.  3. hypertension. Blood pressure at present stable. Continue close monitoring and continue home medications.  4. leukocytosis. Likely secondary to stress reaction continue close monitoring.  Advance goals of care discussion:  Full code   DVT Prophylaxis: on chronic anticoagulation Nutrition:  Cardiac diet  Family Communication:  Family was present at bedside, opportunity was given to ask question and all questions were answered satisfactorily at the time of interview. Disposition: Admitted to inpatient in step-down nit.  Author: Berle Mull, MD [facility] Pager: [phone] 04/14/2014, 6:38 AM    If 7PM-7AM, please contact night-coverage [email or web address] Password [email or web address]

## 2014-04-14 NOTE — Progress Notes (Signed)
[facility] PROGRESS NOTE  Robert Boyle XHB:[id number] DOB: 22-Mar-1936 DOA: 04/13/2014 PCP: Chesley Noon, MD  Assessment/Plan: 1. Pulmonary Embolism -Patient presenting with pleuritic CP -CT scan showed pulmonary embolus within both main pulmonary arteries, tracking into the pulmonary arteries to all lobes of both lungs.  -He was recently hospitalized undergoing CABG x 5 on 03/25/2014 -Will monitor closely overnight in the SDU -CT showing evidence of heart strain. He is hemodynamically stable.   2. CAD -S/p 5 vessel CABG performed on 03/25/2014  -He reported chest pain having atypical feaures -CT showed clot with Troponin within normal limits -Continue ASA 325 mg PO q daily, metoprolol 50 mg PO BID, lisinopril 10 mg PO q daily  3. Sinus Tachycardia -Patient's heart rates remain in the 110's -Likely secondary to PE  4.  Hypertension -On multiple blood pressure agents, SBP's remain stable in the 130's -Monitor closely as he has PE with evidence of right heart strain   Code Status: Full Code Family Communication:  Disposition Plan: Contnue close monitoring in SDU    HPI/Subjective: Patient is a 78 year old gentleman with a past medical history of hypertension, dyslipidemia, coronary artery disease status post coronary artery bypass grafting, presented to the emergency room on 04/13/2014 with complaints of pleuritic chest pain associated with sinus tachycardia. CT scan of lungs showed Pulmonary embolus within both main pulmonary arteries, tracking into the pulmonary arteries to all lobes of both lungs. He was started on IV Heparin and admitted to the SDU for close monitoring.     Objective: Filed Vitals:   04/14/14 1400  BP: 137/63  Pulse: 116  Resp: 24    Intake/Output Summary (Last 24 hours) at 04/14/14 1452 Last data filed at 04/14/14 1400  Gross per 24 hour  Intake 456.12 ml  Output    500 ml  Net -43.88 ml   Filed Weights   04/14/14 0241   Weight: 94.5 kg (208 lb 5.4 oz)    Exam:   General:  Patient is in no acute distress, states feeling better, breathing easier  Cardiovascular: Tachycardic, regular rate and rhythm, normal S1S2  Respiratory: Clear to auscultation bilaterally, does not appear dyspneic or in respiratory distress  Abdomen: Soft, nontender nondistended  Musculoskeletal: no edema  Data Reviewed: Basic Metabolic Panel:  Recent Labs Lab 04/14/14 0050  NA 132*  K 4.0  CL 100  CO2 20  GLUCOSE 111*  BUN 15  CREATININE 1.10  CALCIUM 9.1   Liver Function Tests:  Recent Labs Lab 04/14/14 0050  AST 23  ALT 28  ALKPHOS 109  BILITOT 1.3*  PROT 6.5  ALBUMIN 3.1*   No results for input(s): LIPASE, AMYLASE in the last 168 hours. No results for input(s): AMMONIA in the last 168 hours. CBC:  Recent Labs Lab 04/14/14 0050  WBC 12.6*  NEUTROABS 10.7*  HGB 10.8*  HCT 32.5*  MCV 91.3  PLT 187   Cardiac Enzymes:  Recent Labs Lab 04/14/14 0050  TROPONINI <0.03   BNP (last 3 results) No results for input(s): PROBNP in the last 8760 hours. CBG: No results for input(s): GLUCAP in the last 168 hours.  No results found for this or any previous visit (from the past 240 hour(s)).   Studies: Dg Chest 2 View  04/14/2014   CLINICAL DATA:  Right-sided abdominal pain for 2 days  EXAM: CHEST  2 VIEW  COMPARISON:  Chest radiograph 03/24/2014  FINDINGS: Sternotomy wires overlie normal cardiac silhouette. There is chronic elevation the right hemidiaphragm with  associated right lower lobe atelectasis. No effusion, infiltrate, or pneumothorax.  IMPRESSION: 1. No clear acute cardiopulmonary findings. 2. Chronic elevation right hemidiaphragm with associated atelectasis.   Electronically Signed   By: [name]   On: 04/14/2014 00:32   Ct Angio Chest Pe W/cm &/or Wo Cm  04/14/2014   CLINICAL DATA:  Acute onset of generalized chest pain and back pain. Shortness of breath. Recent CABG. Initial  encounter.  EXAM: CT ANGIOGRAPHY CHEST WITH CONTRAST  TECHNIQUE: Multidetector CT imaging of the chest was performed using the standard protocol during bolus administration of intravenous contrast. Multiplanar CT image reconstructions and MIPs were obtained to evaluate the vascular anatomy.  CONTRAST:  53mL OMNIPAQUE IOHEXOL 350 MG/ML SOLN  COMPARISON:  Chest radiograph performed earlier today at 12:07 a.m.  FINDINGS: There is pulmonary embolus within both main pulmonary arteries, tracking into the pulmonary arteries to all lobes of both lungs. The RV/LV ratio of 1.4 corresponds to right heart strain and concern for submassive pulmonary embolus.  Focal right basilar airspace opacification is most compatible with pulmonary infarct. Scattered blebs are noted at the lung apices. Trace left basilar pleural fluid is noted, with mild associated atelectasis. There is no evidence of pneumothorax. No masses are identified; no abnormal focal contrast enhancement is seen.  A trace pericardial effusion is noted. Diffuse coronary artery calcification is seen. The patient is status post median sternotomy. No mediastinal lymphadenopathy is seen. Scattered calcific atherosclerotic disease is noted along the aortic arch and proximal great vessels. There is mild dilatation of the ascending thoracic aorta, measuring up to 3.8 cm, without evidence of aneurysmal dilatation. No axillary lymphadenopathy is seen. The visualized portions of the thyroid gland are unremarkable in appearance.  The visualized portions of the liver and spleen are unremarkable. The visualized portions of the pancreas, stomach, adrenal glands and kidneys are within normal limits. Nonspecific perinephric stranding is noted bilaterally. The patient is status post cholecystectomy, with clips noted at the gallbladder fossa.  No acute osseous abnormalities are seen.  Review of the MIP images confirms the above findings.  IMPRESSION: 1. Pulmonary embolus within both  main pulmonary arteries, tracking into the pulmonary arteries to all lobes of both lungs. RV/LV ratio of 1.4 corresponds to right heart strain and concern for submassive pulmonary embolus. 2. Focal right basilar pulmonary infarct noted. 3. Trace left basilar pleural fluid, with mild associated atelectasis. 4. Scattered blebs at the lung apices. 5. Trace pericardial effusion seen. 6. Diffuse coronary artery calcification noted. 7. Mild dilatation of the ascending thoracic aorta to 3.8 cm, without evidence of aneurysmal dilatation.  Critical Value/emergent results were called by telephone at the time of interpretation on 04/14/2014 at 2:40 am to Dr. Delora Fuel, who verbally acknowledged these results.   Electronically Signed   By: [name]   On: 04/14/2014 02:45    Scheduled Meds: . allopurinol  300 mg Oral Daily  . amLODipine  5 mg Oral Daily  . aspirin  325 mg Oral Daily  . atorvastatin  80 mg Oral Daily  . lidocaine  1 patch Transdermal Q24H  . lisinopril  10 mg Oral Daily  . metoprolol  50 mg Oral BID WC  . pantoprazole  40 mg Oral Daily  . sodium chloride  3 mL Intravenous Q12H   Continuous Infusions: . heparin 1,450 Units/hr (04/14/14 0314)    Principal Problem:   Acute pulmonary embolism Active Problems:   Essential hypertension   CAD (coronary artery disease)  S/P CABG x 5: LIMA-LAD, SVG-D1-D2, SVG-RPDA-RPL    Time spent:     Kelvin Cellar  [facility] Pager [phone]. If 7PM-7AM, please contact night-coverage at [email or web address], password [facility] 04/14/2014, 2:52 PM  LOS: 1 day

## 2014-04-14 NOTE — Progress Notes (Addendum)
ANTICOAGULATION CONSULT NOTE - Initial Consult  Pharmacy Consult for Heparin Indication: pulmonary embolus  No Known Allergies  Patient Measurements: Weight: 208 lb 5.4 oz (94.5 kg) Heparin Dosing Weight: 90 kg  Vital Signs: BP: 141/55 mmHg (12/26 1200) Pulse Rate: 114 (12/26 1200)  Labs:  Recent Labs  04/14/14 0050 04/14/14 1217  HGB 10.8*  --   HCT 32.5*  --   PLT 187  --   HEPARINUNFRC  --  0.39  CREATININE 1.10  --   TROPONINI <0.03  --     Estimated Creatinine Clearance: 64.6 mL/min (by C-G formula based on Cr of 1.1).   Medical History: Past Medical History  Diagnosis Date  . Essential hypertension   . Hyperlipidemia with target LDL less than 70   . CAD S/P percutaneous coronary angioplasty 2000; 2001    a) BMS to D1 75% stenosis (~2.5 mm x 18 mm AVE BMS), mid LAD ~40-50%, mRCA 30-40%, small Cx;; b) 75% ISR of D1 stent - PTCA with 2.5 mm x 15 mm balloon;; c) Myoview 7/'13: EF 63% is inferoseptal gut attenuation artifact but no reversible ischemia  . Gastrointestinal complaints   . Prostate cancer 05/23/13    gleason 4+3=7, vloume 34.32 cc; s/p Chemo-XRT  . History of radiation therapy 09/06/13- 11/02/13    prostate 7600 cGy 40 sessions, seminal vesicles 5600 cGy 40 sessions  . Gout   . Arthritis   . GERD (gastroesophageal reflux disease)   . H/O renal calculi   . Shortness of breath dyspnea     Medications:  Allopurinol  Norvasc  ASA  Lipitor  Trinsicon  Lasix  Zestril  Lopressor  MVI  Prilosec  Oxy IR  Assessment: 78 yo male s/p CABG 12/2 admitted on 04/13/2014 with PE, for heparin. Initial HL is therapeutic at 0.39. H/H is low (consistent with baseline), plt wnl with no reported s/s bleeding.  Goal of Therapy:  Heparin level 0.3-0.7 units/ml Monitor platelets by anticoagulation protocol: Yes   Plan:  - Continue heparin gtt @ 1450 u/hr - Confirmatory HL in 8 hours - Daily HL/CBC - Monitor for s/s bleeding - F/u long-term anticoagulation  plans  Erika K. Velva Harman, PharmD Clinical Pharmacist - Resident Pager: [phone] Pharmacy: [phone] 04/14/2014 1:21 PM  -------------------------------------------------------------------------------------------------- Addendum:  Confirmatory heparin level this evening remains therapeutic (HL 0.38 << 0.39, goal of 0.3-0.7). Per discussion with RN - no overt s/sx of bleeding noted. Continue at current rate - will f/u with HL in the AM.  Plan 1. Continue heparin at 1450 units/hr (14.5 ml/hr) 2. Will continue to monitor for any signs/symptoms of bleeding and will follow up with heparin level in the a.m.   Alycia Rossetti, PharmD, BCPS Clinical Pharmacist Pager: [phone] 04/14/2014 9:52 PM

## 2014-04-14 NOTE — ED Notes (Signed)
Pt returned from scans. Monitored by pulse ox, bp cuff, and 5-lead. 

## 2014-04-14 NOTE — Progress Notes (Signed)
[address]       [place] [address]             [phone]          Subjective: Feels better since arrived in hospital   Objective: Vital signs in last 24 hours: Pulse Rate:  [110-122] 117 (12/26 1145) Cardiac Rhythm:  [-] Sinus tachycardia (12/25 2122) Resp:  [17-28] 20 (12/26 1145) BP: (103-140)/(29-76) 140/61 mmHg (12/26 1145) SpO2:  [92 %-97 %] 95 % (12/26 1145) Weight:  [208 lb 5.4 oz (94.5 kg)] 208 lb 5.4 oz (94.5 kg) (12/26 0241)  Hemodynamic parameters for last 24 hours:    Intake/Output from previous day:   Intake/Output this shift: Total I/O In: 127.1 [I.V.:127.1] Out: 200 [Urine:200]  General appearance: alert, cooperative and no distress Heart: regular rate and rhythm and tachy Lungs: dim right>left base Abdomen: benign Extremities: no edema Wound: incis well healed  Lab Results:  Recent Labs  04/14/14 0050  WBC 12.6*  HGB 10.8*  HCT 32.5*  PLT 187   BMET:  Recent Labs  04/14/14 0050  NA 132*  K 4.0  CL 100  CO2 20  GLUCOSE 111*  BUN 15  CREATININE 1.10  CALCIUM 9.1    PT/INR: No results for input(s): LABPROT, INR in the last 72 hours. ABG    Component Value Date/Time   PHART 7.327* 03/21/2014 2009   HCO3 20.6 03/21/2014 2009   TCO2 20 03/22/2014 1750   ACIDBASEDEF 5.0* 03/21/2014 2009   O2SAT 92.0 03/21/2014 2009   CBG (last 3)  No results for input(s): GLUCAP in the last 72 hours.  Meds Scheduled Meds: . allopurinol  300 mg Oral Daily  . amLODipine  5 mg Oral Daily  . aspirin  325 mg Oral Daily  . atorvastatin  80 mg Oral Daily  . lidocaine  1 patch Transdermal Q24H  . lisinopril  10 mg Oral Daily  . metoprolol  50 mg Oral BID WC  . pantoprazole  40 mg Oral Daily  . sodium chloride  3 mL Intravenous Q12H   Continuous Infusions: . heparin 1,450 Units/hr (04/14/14 0314)   PRN Meds:.acetaminophen **OR** acetaminophen, diazepam, morphine injection, ondansetron **OR** ondansetron (ZOFRAN) IV,  oxyCODONE  Xrays Dg Chest 2 View  04/14/2014   CLINICAL DATA:  Right-sided abdominal pain for 2 days  EXAM: CHEST  2 VIEW  COMPARISON:  Chest radiograph 03/24/2014  FINDINGS: Sternotomy wires overlie normal cardiac silhouette. There is chronic elevation the right hemidiaphragm with associated right lower lobe atelectasis. No effusion, infiltrate, or pneumothorax.  IMPRESSION: 1. No clear acute cardiopulmonary findings. 2. Chronic elevation right hemidiaphragm with associated atelectasis.   Electronically Signed   By: [name]   On: 04/14/2014 00:32   Ct Angio Chest Pe W/cm &/or Wo Cm  04/14/2014   CLINICAL DATA:  Acute onset of generalized chest pain and back pain. Shortness of breath. Recent CABG. Initial encounter.  EXAM: CT ANGIOGRAPHY CHEST WITH CONTRAST  TECHNIQUE: Multidetector CT imaging of the chest was performed using the standard protocol during bolus administration of intravenous contrast. Multiplanar CT image reconstructions and MIPs were obtained to evaluate the vascular anatomy.  CONTRAST:  63mL OMNIPAQUE IOHEXOL 350 MG/ML SOLN  COMPARISON:  Chest radiograph performed earlier today at 12:07 a.m.  FINDINGS: There is pulmonary embolus within both main pulmonary arteries, tracking into the pulmonary arteries to all lobes of both lungs. The RV/LV ratio of 1.4 corresponds to right heart  strain and concern for submassive pulmonary embolus.  Focal right basilar airspace opacification is most compatible with pulmonary infarct. Scattered blebs are noted at the lung apices. Trace left basilar pleural fluid is noted, with mild associated atelectasis. There is no evidence of pneumothorax. No masses are identified; no abnormal focal contrast enhancement is seen.  A trace pericardial effusion is noted. Diffuse coronary artery calcification is seen. The patient is status post median sternotomy. No mediastinal lymphadenopathy is seen. Scattered calcific atherosclerotic disease is noted along the  aortic arch and proximal great vessels. There is mild dilatation of the ascending thoracic aorta, measuring up to 3.8 cm, without evidence of aneurysmal dilatation. No axillary lymphadenopathy is seen. The visualized portions of the thyroid gland are unremarkable in appearance.  The visualized portions of the liver and spleen are unremarkable. The visualized portions of the pancreas, stomach, adrenal glands and kidneys are within normal limits. Nonspecific perinephric stranding is noted bilaterally. The patient is status post cholecystectomy, with clips noted at the gallbladder fossa.  No acute osseous abnormalities are seen.  Review of the MIP images confirms the above findings.  IMPRESSION: 1. Pulmonary embolus within both main pulmonary arteries, tracking into the pulmonary arteries to all lobes of both lungs. RV/LV ratio of 1.4 corresponds to right heart strain and concern for submassive pulmonary embolus. 2. Focal right basilar pulmonary infarct noted. 3. Trace left basilar pleural fluid, with mild associated atelectasis. 4. Scattered blebs at the lung apices. 5. Trace pericardial effusion seen. 6. Diffuse coronary artery calcification noted. 7. Mild dilatation of the ascending thoracic aorta to 3.8 cm, without evidence of aneurysmal dilatation.  Critical Value/emergent results were called by telephone at the time of interpretation on 04/14/2014 at 2:40 am to Dr. Delora Fuel, who verbally acknowledged these results.   Electronically Signed   By: [name]   On: 04/14/2014 02:45    Assessment/Plan: Bilat PE, on heparin. No specific surgical issues at this time    LOS: 1 day    Robert Boyle 04/14/2014

## 2014-04-14 NOTE — ED Provider Notes (Signed)
CSN: [id number]     Arrival date & time 04/13/14  2100 History  This chart was scribe for Delora Fuel, MD by Judithann Sauger, ED Scribe. The patient was seen in room B19C/B19C and the patient's care was started at 12:34 AM.    Chief Complaint  Patient presents with  . Back Pain   The history is provided by the patient. No language interpreter was used.   HPI Comments: Robert Boyle is a 78 y.o. male with a CABG on 03/21/14 who presents to the Emergency Department complaining of back pain onset 3 days ago and increased SOB onset yesterday night. He reports associated SOB on exertion and a lack of appetite onset 2 days ago. He denies a cough and a fever. He reports that after his CABG, his SOB was getting better but 2 days ago, he was unable to sleep and around 3:30 am, he had sudden hip and back pain. He took oxycodone and he was unable to sleep. He reports that he only walked one time today and did not walk yesterday. Around 3 pm yesterday, he had the same back pain but the oxycodone did not alleviate his pain and he could not breathe. He denies being on any blood thinners.   Past Medical History  Diagnosis Date  . Essential hypertension   . Hyperlipidemia with target LDL less than 70   . CAD S/P percutaneous coronary angioplasty 2000; 2001    a) BMS to D1 75% stenosis (~2.5 mm x 18 mm AVE BMS), mid LAD ~40-50%, mRCA 30-40%, small Cx;; b) 75% ISR of D1 stent - PTCA with 2.5 mm x 15 mm balloon;; c) Myoview 7/'13: EF 63% is inferoseptal gut attenuation artifact but no reversible ischemia  . Gastrointestinal complaints   . Prostate cancer 05/23/13    gleason 4+3=7, vloume 34.32 cc; s/p Chemo-XRT  . History of radiation therapy 09/06/13- 11/02/13    prostate 7600 cGy 40 sessions, seminal vesicles 5600 cGy 40 sessions  . Gout   . Arthritis   . GERD (gastroesophageal reflux disease)   . H/O renal calculi   . Shortness of breath dyspnea    Past Surgical History  Procedure Laterality Date  .  Coronary angioplasty with stent placement  10/17/1998    PCI with ~2.5 mm x 18 mm AVE BMS to D1  . Coronary angioplasty  07/21/1999    PTCA to ISR stenosis to LAD  . Nm myocar perf wall motion  11/06/2011    low risk 63% ; FIXED INFEROSEPTAL GUT ATTENUATION  . Prostate biopsy  05/23/13    gleason 4+3=7, vol 34.32 cc  . Cholecystectomy  01/22/10  . Back surgery  1995    lumbar  . Coronary artery bypass graft N/A 03/21/2014    Procedure: CORONARY ARTERY BYPASS GRAFTING (CABG) TIMES FIVE USING LEFT INTERNAL MAMMARY TO LAD, SAPHENOUS VEIN GRAFT TO THE PD/PL, SAPHENOUS VEIN GRAFT TO DIAGONAL 1 AND 2 SEQUENTIALLY, SAPHENOUS VEIN HARVESTED ENDOSCOPICALLY;  Surgeon: Grace Isaac, MD;  Location: [facility];  Service: Open Heart Surgery;  Laterality: N/A;  . Tee without cardioversion N/A 03/21/2014    Procedure: TRANSESOPHAGEAL ECHOCARDIOGRAM (TEE);  Surgeon: Grace Isaac, MD;  Location: [facility];  Service: Open Heart Surgery;  Laterality: N/A;  . Left heart catheterization with coronary angiogram N/A 03/20/2014    Procedure: LEFT HEART CATHETERIZATION WITH CORONARY ANGIOGRAM;  Surgeon: Leonie Man, MD;  Location[facility] LAB;  Service: Cardiovascular;  Laterality: N/A;   Family History  Problem Relation Age of Onset  . Heart attack Father   . Stroke Mother   . Heart attack Brother   . Cancer Brother     one brother-prostate   History  Substance Use Topics  . Smoking status: Former Smoker -- 2.00 packs/day for 25 years    Quit date: 04/20/1983  . Smokeless tobacco: Never Used  . Alcohol Use: Yes     Comment: 3 a day    Review of Systems  Constitutional: Positive for appetite change. Negative for fever.  Respiratory: Positive for shortness of breath. Negative for cough.   Musculoskeletal: Positive for back pain.  All other systems reviewed and are negative.     Allergies  Review of patient's allergies indicates no known allergies.  Home Medications   Prior to Admission medications    Medication Sig Start Date End Date Taking? Authorizing Provider  allopurinol (ZYLOPRIM) 300 MG tablet Take 300 mg by mouth daily.   Yes Historical Provider, MD  amLODipine (NORVASC) 5 MG tablet Take 5 mg by mouth daily.   Yes Historical Provider, MD  aspirin 325 MG EC tablet Take 325 mg by mouth daily.   Yes Historical Provider, MD  atorvastatin (LIPITOR) 80 MG tablet Take 80 mg by mouth daily.   Yes Historical Provider, MD  ferrous [id number] C-folic acid (TRINSICON / FOLTRIN) capsule Take 1 capsule by mouth daily with breakfast. For one month then stop. 03/28/14  Yes Donielle Liston Alba, PA-C  furosemide (LASIX) 20 MG tablet Take 20 mg by mouth daily.   Yes Historical Provider, MD  lisinopril (PRINIVIL,ZESTRIL) 10 MG tablet Take 1 tablet (10 mg total) by mouth daily. 03/27/14  Yes Donielle Liston Alba, PA-C  metoprolol (LOPRESSOR) 50 MG tablet Take 1 tablet (50 mg total) by mouth 2 (two) times daily. 03/27/14  Yes Donielle Liston Alba, PA-C  Multiple Vitamin (MULTIVITAMIN WITH MINERALS) TABS tablet Take 1 tablet by mouth daily.   Yes Historical Provider, MD  omeprazole (PRILOSEC) 20 MG capsule Take 20 mg by mouth daily.   Yes Historical Provider, MD  oxyCODONE (OXY IR/ROXICODONE) 5 MG immediate release tablet Take 1-2 tablets (5-10 mg total) by mouth every 4 (four) hours as needed for severe pain. 04/02/14  Yes Rexene Alberts, MD  alprazolam Duanne Moron) 2 MG tablet Take 1 tablet (2 mg total) by mouth at bedtime. Patient not taking: Reported on 04/13/2014 03/20/14   Leonie Man, MD   BP 115/29 mmHg  Pulse 118  Resp 24  SpO2 97% Physical Exam  Constitutional: He is oriented to person, place, and time. He appears well-developed and well-nourished. No distress.  HENT:  Head: Normocephalic and atraumatic.  Eyes: Conjunctivae and EOM are normal. Pupils are equal, round, and reactive to light.  Neck: Normal range of motion. Neck supple. No JVD present. No tracheal deviation present.   Cardiovascular: Regular rhythm and normal heart sounds.   No murmur heard. Tachycardiac.   Pulmonary/Chest: Effort normal and breath sounds normal. He has no wheezes. He has no rales.  Decreased breath sounds right base  Abdominal: Soft. Bowel sounds are normal. He exhibits no distension and no mass. There is no tenderness.  Musculoskeletal: Normal range of motion. He exhibits no edema.  Lymphadenopathy:    He has no cervical adenopathy.  Neurological: He is alert and oriented to person, place, and time. No cranial nerve deficit. Coordination normal.  Skin: Skin is warm and dry. No rash noted.  Psychiatric: He has a normal mood and affect.  His behavior is normal. Judgment and thought content normal.  Nursing note and vitals reviewed.   ED Course  Procedures (including critical care time) DIAGNOSTIC STUDIES: Oxygen Saturation is 97% on RA, normal by my interpretation.    COORDINATION OF CARE: 12:47 AM- Pt advised of plan for treatment and pt agrees.    Labs Review Labs Reviewed  COMPREHENSIVE METABOLIC PANEL  CBC WITH DIFFERENTIAL  TROPONIN I  BRAIN NATRIURETIC PEPTIDE    Imaging Review Dg Chest 2 View  04/14/2014   CLINICAL DATA:  Right-sided abdominal pain for 2 days  EXAM: CHEST  2 VIEW  COMPARISON:  Chest radiograph 03/24/2014  FINDINGS: Sternotomy wires overlie normal cardiac silhouette. There is chronic elevation the right hemidiaphragm with associated right lower lobe atelectasis. No effusion, infiltrate, or pneumothorax.  IMPRESSION: 1. No clear acute cardiopulmonary findings. 2. Chronic elevation right hemidiaphragm with associated atelectasis.   Electronically Signed   By: [name]   On: 04/14/2014 00:32     EKG Interpretation   Date/Time:  Saturday April 14 2014 00:02:47 EST Ventricular Rate:  116 PR Interval:  127 QRS Duration: 83 QT Interval:  362 QTC Calculation: 503 R Axis:   24 Text Interpretation:  Sinus tachycardia Nonspecific T  abnormalities,  diffuse leads Prolonged QT interval Non-specific ST-t changes When  compared with ECG of 03/22/2014, Nonspecific ST abnormality is now Present  QT has lengthened Reconfirmed by [facility]  MD, Dyneisha Murchison ([id number]) on 04/14/2014  12:19:58 AM      MDM   Final diagnoses:  Shortness of breath  Pulmonary emboli    Dyspnea with tachycardia and patient 3 weeks post coronary artery bypass. Chest x-ray shows chronic elevation of right hemidiaphragm but no acute process. Patient had been seen by cardiology fellow who had done echocardiogram and found no significant pericardial effusion and was continued and about pneumonia. I was concerned about possibility of pulmonary embolism given he is in the postoperative period. He is sent for CT angiogram which is shown multiple pulmonary emboli with some evidence of right heart strain. He is started on anticoagulation with heparin. Case is discussed with Dr. Posey Pronto of [facility] who agrees to admit the patient.  I personally performed the services described in this documentation, which was scribed in my presence. The recorded information has been reviewed and is accurate.    Delora Fuel, MD 08/81/10 [id number]

## 2014-04-14 NOTE — Progress Notes (Signed)
ANTICOAGULATION CONSULT NOTE - Initial Consult  Pharmacy Consult for Heparin Indication: pulmonary embolus  No Known Allergies  Patient Measurements: Weight: 208 lb 5.4 oz (94.5 kg) Heparin Dosing Weight: 90 kg  Vital Signs: BP: 135/62 mmHg (12/26 0205) Pulse Rate: 120 (12/26 0205)  Labs:  Recent Labs  04/14/14 0050  HGB 10.8*  HCT 32.5*  PLT 187  CREATININE 1.10  TROPONINI <0.03    Estimated Creatinine Clearance: 64.6 mL/min (by C-G formula based on Cr of 1.1).   Medical History: Past Medical History  Diagnosis Date  . Essential hypertension   . Hyperlipidemia with target LDL less than 70   . CAD S/P percutaneous coronary angioplasty 2000; 2001    a) BMS to D1 75% stenosis (~2.5 mm x 18 mm AVE BMS), mid LAD ~40-50%, mRCA 30-40%, small Cx;; b) 75% ISR of D1 stent - PTCA with 2.5 mm x 15 mm balloon;; c) Myoview 7/'13: EF 63% is inferoseptal gut attenuation artifact but no reversible ischemia  . Gastrointestinal complaints   . Prostate cancer 05/23/13    gleason 4+3=7, vloume 34.32 cc; s/p Chemo-XRT  . History of radiation therapy 09/06/13- 11/02/13    prostate 7600 cGy 40 sessions, seminal vesicles 5600 cGy 40 sessions  . Gout   . Arthritis   . GERD (gastroesophageal reflux disease)   . H/O renal calculi   . Shortness of breath dyspnea     Medications:  Allopurinol  Norvasc  ASA  Lipitor  Trinsicon  Lasix  Zestril  Lopressor  MVI  Prilosec  Oxy IR  Assessment: 78 yo male s/p CABG 12/2, now with PE, for heparin  Goal of Therapy:  Heparin level 0.3-0.7 units/ml Monitor platelets by anticoagulation protocol: Yes   Plan:  Heparin 4000 units IV bolus, then 1450 units/hr Check heparin level in 6 hours.   Phillis Knack, PharmD, BCPS   Arleen Bar, Bronson Curb 04/14/2014,2:43 AM

## 2014-04-15 DIAGNOSIS — I9589 Other hypotension: Secondary | ICD-10-CM

## 2014-04-15 DIAGNOSIS — N179 Acute kidney failure, unspecified: Secondary | ICD-10-CM

## 2014-04-15 LAB — COMPREHENSIVE METABOLIC PANEL
ALT: 34 U/L (ref 0–53)
AST: 36 U/L (ref 0–37)
Albumin: 2.6 g/dL — ABNORMAL LOW (ref 3.5–5.2)
Alkaline Phosphatase: 114 U/L (ref 39–117)
Anion gap: 8 (ref 5–15)
BUN: 20 mg/dL (ref 6–23)
CALCIUM: 8.8 mg/dL (ref 8.4–10.5)
CO2: 22 mmol/L (ref 19–32)
CREATININE: 1.87 mg/dL — AB (ref 0.50–1.35)
Chloride: 101 mEq/L (ref 96–112)
GFR calc Af Amer: 39 mL/min — ABNORMAL LOW (ref >90)
GFR calc non Af Amer: 33 mL/min — ABNORMAL LOW (ref >90)
Glucose, Bld: 104 mg/dL — ABNORMAL HIGH (ref 70–99)
Potassium: 3.8 mmol/L (ref 3.5–5.1)
SODIUM: 131 mmol/L — AB (ref 135–145)
TOTAL PROTEIN: 6.4 g/dL (ref 6.0–8.3)
Total Bilirubin: 0.7 mg/dL (ref 0.3–1.2)

## 2014-04-15 LAB — APTT: APTT: 80 s — AB (ref 24–37)

## 2014-04-15 LAB — CBC
HCT: 28.7 % — ABNORMAL LOW (ref 39.0–52.0)
Hemoglobin: 9.2 g/dL — ABNORMAL LOW (ref 13.0–17.0)
MCH: 29 pg (ref 26.0–34.0)
MCHC: 32.1 g/dL (ref 30.0–36.0)
MCV: 90.5 fL (ref 78.0–100.0)
PLATELETS: 214 10*3/uL (ref 150–400)
RBC: 3.17 MIL/uL — AB (ref 4.22–5.81)
RDW: 14.5 % (ref 11.5–15.5)
WBC: 10.4 10*3/uL (ref 4.0–10.5)

## 2014-04-15 LAB — PROTIME-INR
INR: 1.27 (ref 0.00–1.49)
Prothrombin Time: 16 seconds — ABNORMAL HIGH (ref 11.6–15.2)

## 2014-04-15 LAB — HEPARIN LEVEL (UNFRACTIONATED)
Heparin Unfractionated: 0.26 IU/mL — ABNORMAL LOW (ref 0.30–0.70)
Heparin Unfractionated: 0.52 IU/mL (ref 0.30–0.70)

## 2014-04-15 MED ORDER — PERFLUTREN LIPID MICROSPHERE
INTRAVENOUS | Status: AC
  Administered 2014-04-15: 2 mL
  Filled 2014-04-15: qty 10

## 2014-04-15 MED ORDER — SODIUM CHLORIDE 0.9 % IV SOLN
INTRAVENOUS | Status: DC
  Administered 2014-04-15: 09:00:00 via INTRAVENOUS

## 2014-04-15 MED ORDER — SODIUM CHLORIDE 0.9 % IV BOLUS (SEPSIS)
1000.0000 mL | Freq: Once | INTRAVENOUS | Status: AC
  Administered 2014-04-15: 1000 mL via INTRAVENOUS

## 2014-04-15 MED ORDER — PERFLUTREN LIPID MICROSPHERE
1.0000 mL | INTRAVENOUS | Status: AC | PRN
  Filled 2014-04-15: qty 10

## 2014-04-15 MED ORDER — HEPARIN (PORCINE) IN NACL 100-0.45 UNIT/ML-% IJ SOLN
1600.0000 [IU]/h | INTRAMUSCULAR | Status: DC
  Administered 2014-04-15 – 2014-04-16 (×4): 1650 [IU]/h via INTRAVENOUS
  Filled 2014-04-15 (×6): qty 250

## 2014-04-15 MED ORDER — HEPARIN BOLUS VIA INFUSION
1000.0000 [IU] | Freq: Once | INTRAVENOUS | Status: AC
  Administered 2014-04-15: 1000 [IU] via INTRAVENOUS
  Filled 2014-04-15: qty 1000

## 2014-04-15 NOTE — Progress Notes (Signed)
[facility] PROGRESS NOTE  Robert Boyle DZH:[id number] DOB: Nov 11, 1935 DOA: 04/13/2014 PCP: Chesley Noon, MD  Assessment/Plan: 1. Pulmonary Embolism -Patient presenting with pleuritic CP -CT scan showed pulmonary embolus within both main pulmonary arteries, tracking into the pulmonary arteries to all lobes of both lungs. There is evidence of right hearted strain.  -He was recently hospitalized undergoing CABG x 5 on 03/25/2014 -Case discussed with PCCM, patient would not be candidate for thrombolytics given recent CABG.  -Continue IV heparin for now  2. Hypotension -He became hypotensive overnight with SBP's in the 80's responding to IV fluids as his SBP's now in the 120's -Norvasc and Lisinopril have been discontinued -Will continue IV fluid hydration   3.  Acute Kidney Injury -AM labs showing an elevated creatinine of 1.87, increased from 1.1 -Could be due to ATN from hypotension -1 Liter bolus is currently being given -Lisinopril and Norvasc discontinued.  -Monitor volume status  4. CAD -S/p 5 vessel CABG performed on 03/25/2014  -He reported chest pain having atypical feaures -CT showed clot with Troponin within normal limits -Continue ASA 325 mg PO q daily, metoprolol 50 mg PO BID, lisinopril 10 mg PO q daily  5. Sinus Tachycardia -HR's improving -Likely secondary to PE   Code Status: Full Code Family Communication:  Disposition Plan: Contnue close monitoring in SDU    HPI/Subjective: Patient is a 78 year old gentleman with a past medical history of hypertension, dyslipidemia, coronary artery disease status post coronary artery bypass grafting, presented to the emergency room on 04/13/2014 with complaints of pleuritic chest pain associated with sinus tachycardia. CT scan of lungs showed Pulmonary embolus within both main pulmonary arteries, tracking into the pulmonary arteries to all lobes of both lungs. He was started on IV Heparin and admitted to the  SDU for close monitoring.     Objective: Filed Vitals:   04/15/14 0757  BP: 126/66  Pulse: 105  Temp: 98 F (36.7 C)  Resp: 16    Intake/Output Summary (Last 24 hours) at 04/15/14 0806 Last data filed at 04/15/14 0650  Gross per 24 hour  Intake 928.12 ml  Output    425 ml  Net 503.12 ml   Filed Weights   04/14/14 0241 04/15/14 0400  Weight: 94.5 kg (208 lb 5.4 oz) 88.8 kg (195 lb 12.3 oz)    Exam:   General:  Patient is in no acute distress, sitting up having his breakfast  Cardiovascular: Tachycardic, regular rate and rhythm, normal S1S2  Respiratory: Clear to auscultation bilaterally, does not appear dyspneic or in respiratory distress  Abdomen: Soft, nontender nondistended  Musculoskeletal: no edema  Data Reviewed: Basic Metabolic Panel:  Recent Labs Lab 04/14/14 0050 04/15/14 0230  NA 132* 131*  K 4.0 3.8  CL 100 101  CO2 20 22  GLUCOSE 111* 104*  BUN 15 20  CREATININE 1.10 1.87*  CALCIUM 9.1 8.8   Liver Function Tests:  Recent Labs Lab 04/14/14 0050 04/15/14 0230  AST 23 36  ALT 28 34  ALKPHOS 109 114  BILITOT 1.3* 0.7  PROT 6.5 6.4  ALBUMIN 3.1* 2.6*   No results for input(s): LIPASE, AMYLASE in the last 168 hours. No results for input(s): AMMONIA in the last 168 hours. CBC:  Recent Labs Lab 04/14/14 0050 04/15/14 0230  WBC 12.6* 10.4  NEUTROABS 10.7*  --   HGB 10.8* 9.2*  HCT 32.5* 28.7*  MCV 91.3 90.5  PLT 187 214   Cardiac Enzymes:  Recent Labs Lab 04/14/14 0050  TROPONINI <0.03   BNP (last 3 results) No results for input(s): PROBNP in the last 8760 hours. CBG: No results for input(s): GLUCAP in the last 168 hours.  Recent Results (from the past 240 hour(s))  MRSA PCR Screening     Status: None   Collection Time: 04/14/14  3:37 PM  Result Value Ref Range Status   MRSA by PCR NEGATIVE NEGATIVE Final    Comment:        The GeneXpert MRSA Assay (FDA approved for NASAL specimens only), is one component of  a comprehensive MRSA colonization surveillance program. It is not intended to diagnose MRSA infection nor to guide or monitor treatment for MRSA infections.      Studies: Dg Chest 2 View  04/14/2014   CLINICAL DATA:  Right-sided abdominal pain for 2 days  EXAM: CHEST  2 VIEW  COMPARISON:  Chest radiograph 03/24/2014  FINDINGS: Sternotomy wires overlie normal cardiac silhouette. There is chronic elevation the right hemidiaphragm with associated right lower lobe atelectasis. No effusion, infiltrate, or pneumothorax.  IMPRESSION: 1. No clear acute cardiopulmonary findings. 2. Chronic elevation right hemidiaphragm with associated atelectasis.   Electronically Signed   By: [name]   On: 04/14/2014 00:32   Ct Angio Chest Pe W/cm &/or Wo Cm  04/14/2014   CLINICAL DATA:  Acute onset of generalized chest pain and back pain. Shortness of breath. Recent CABG. Initial encounter.  EXAM: CT ANGIOGRAPHY CHEST WITH CONTRAST  TECHNIQUE: Multidetector CT imaging of the chest was performed using the standard protocol during bolus administration of intravenous contrast. Multiplanar CT image reconstructions and MIPs were obtained to evaluate the vascular anatomy.  CONTRAST:  95mL OMNIPAQUE IOHEXOL 350 MG/ML SOLN  COMPARISON:  Chest radiograph performed earlier today at 12:07 a.m.  FINDINGS: There is pulmonary embolus within both main pulmonary arteries, tracking into the pulmonary arteries to all lobes of both lungs. The RV/LV ratio of 1.4 corresponds to right heart strain and concern for submassive pulmonary embolus.  Focal right basilar airspace opacification is most compatible with pulmonary infarct. Scattered blebs are noted at the lung apices. Trace left basilar pleural fluid is noted, with mild associated atelectasis. There is no evidence of pneumothorax. No masses are identified; no abnormal focal contrast enhancement is seen.  A trace pericardial effusion is noted. Diffuse coronary artery  calcification is seen. The patient is status post median sternotomy. No mediastinal lymphadenopathy is seen. Scattered calcific atherosclerotic disease is noted along the aortic arch and proximal great vessels. There is mild dilatation of the ascending thoracic aorta, measuring up to 3.8 cm, without evidence of aneurysmal dilatation. No axillary lymphadenopathy is seen. The visualized portions of the thyroid gland are unremarkable in appearance.  The visualized portions of the liver and spleen are unremarkable. The visualized portions of the pancreas, stomach, adrenal glands and kidneys are within normal limits. Nonspecific perinephric stranding is noted bilaterally. The patient is status post cholecystectomy, with clips noted at the gallbladder fossa.  No acute osseous abnormalities are seen.  Review of the MIP images confirms the above findings.  IMPRESSION: 1. Pulmonary embolus within both main pulmonary arteries, tracking into the pulmonary arteries to all lobes of both lungs. RV/LV ratio of 1.4 corresponds to right heart strain and concern for submassive pulmonary embolus. 2. Focal right basilar pulmonary infarct noted. 3. Trace left basilar pleural fluid, with mild associated atelectasis. 4. Scattered blebs at the lung apices. 5. Trace pericardial effusion seen. 6. Diffuse coronary artery calcification noted. 7. Mild  dilatation of the ascending thoracic aorta to 3.8 cm, without evidence of aneurysmal dilatation.  Critical Value/emergent results were called by telephone at the time of interpretation on 04/14/2014 at 2:40 am to Dr. Delora Fuel, who verbally acknowledged these results.   Electronically Signed   By: [name]   On: 04/14/2014 02:45    Scheduled Meds: . allopurinol  300 mg Oral Daily  . aspirin  325 mg Oral Daily  . atorvastatin  80 mg Oral Daily  . lidocaine  1 patch Transdermal Q24H  . metoprolol  50 mg Oral BID WC  . pantoprazole  40 mg Oral Daily  . sodium chloride  3 mL  Intravenous Q12H   Continuous Infusions: . heparin 1,450 Units/hr (04/14/14 1930)    Principal Problem:   Acute pulmonary embolism Active Problems:   Essential hypertension   CAD (coronary artery disease)   S/P CABG x 5: LIMA-LAD, SVG-D1-D2, SVG-RPDA-RPL    Time spent: 45 min    Kelvin Cellar  [facility] Pager [phone]. If 7PM-7AM, please contact night-coverage at [email or web address], password [facility] 04/15/2014, 8:06 AM  LOS: 2 days

## 2014-04-15 NOTE — Progress Notes (Signed)
ANTICOAGULATION CONSULT NOTE - Initial Consult  Pharmacy Consult for Heparin Indication: pulmonary embolus  No Known Allergies  Patient Measurements: Weight: 195 lb 12.3 oz (88.8 kg) Heparin Dosing Weight: 90 kg  Vital Signs: Temp: 98 F (36.7 C) (12/27 0757) Temp Source: Oral (12/27 0757) BP: 126/66 mmHg (12/27 0757) Pulse Rate: 105 (12/27 0757)  Labs:  Recent Labs  04/14/14 0050 04/14/14 1217 04/14/14 2022 04/15/14 0230  HGB 10.8*  --   --  9.2*  HCT 32.5*  --   --  28.7*  PLT 187  --   --  214  APTT  --   --   --  80*  LABPROT  --   --   --  16.0*  INR  --   --   --  1.27  HEPARINUNFRC  --  0.39 0.38 0.26*  CREATININE 1.10  --   --  1.87*  TROPONINI <0.03  --   --   --     Estimated Creatinine Clearance: 38 mL/min (by C-G formula based on Cr of 1.87).   Medical History: Past Medical History  Diagnosis Date  . Essential hypertension   . Hyperlipidemia with target LDL less than 70   . CAD S/P percutaneous coronary angioplasty 2000; 2001    a) BMS to D1 75% stenosis (~2.5 mm x 18 mm AVE BMS), mid LAD ~40-50%, mRCA 30-40%, small Cx;; b) 75% ISR of D1 stent - PTCA with 2.5 mm x 15 mm balloon;; c) Myoview 7/'13: EF 63% is inferoseptal gut attenuation artifact but no reversible ischemia  . Gastrointestinal complaints   . Prostate cancer 05/23/13    gleason 4+3=7, vloume 34.32 cc; s/p Chemo-XRT  . History of radiation therapy 09/06/13- 11/02/13    prostate 7600 cGy 40 sessions, seminal vesicles 5600 cGy 40 sessions  . Gout   . Arthritis   . GERD (gastroesophageal reflux disease)   . H/O renal calculi   . Shortness of breath dyspnea     Medications:  Allopurinol  Norvasc  ASA  Lipitor  Trinsicon  Lasix  Zestril  Lopressor  MVI  Prilosec  Oxy IR  Assessment: 78 yo male s/p CABG 12/2 admitted on 04/13/2014 with PE, for heparin. HL has trended down to below goal at 0.26 after 2 therapeutic levels. H/H is low and has trended down, plt remain wnl and stable. Per nurse,  no interruptions in gtt and no significant s/s of bleeding noted.   Goal of Therapy:  Heparin level 0.3-0.7 units/ml Monitor platelets by anticoagulation protocol: Yes   Plan:  - Heparin 1000 units x 1 bolus - Increase heparin gtt to 1650 u/hr (~2 u/kg/hr increase) - HL in 8 hours - Daily HL/CBC - Monitor for s/s bleeding - F/u long-term anticoagulation plans  Monda Chastain K. Velva Harman, PharmD Clinical Pharmacist - Resident Pager: [phone] Pharmacy: [phone] 04/15/2014 9:08 AM

## 2014-04-15 NOTE — Progress Notes (Signed)
Echocardiogram 2D Echocardiogram has been performed.  Robert Boyle 04/15/2014, 3:00 PM

## 2014-04-15 NOTE — Progress Notes (Signed)
ANTICOAGULATION CONSULT NOTE - Follow Up Consult  Pharmacy Consult for Heparin Indication: pulmonary embolus  No Known Allergies  Patient Measurements: Height: 6\' 1"  (185.4 cm) Weight: 195 lb 12.3 oz (88.8 kg) IBW/kg (Calculated) : 79.9 Heparin Dosing Weight: 89 kg  Vital Signs: Temp: 98.2 F (36.8 C) (12/27 1556) Temp Source: Oral (12/27 1556) BP: 103/52 mmHg (12/27 1930) Pulse Rate: 70 (12/27 1930)  Labs:  Recent Labs  04/14/14 0050  04/14/14 2022 04/15/14 0230 04/15/14 1855  HGB 10.8*  --   --  9.2*  --   HCT 32.5*  --   --  28.7*  --   PLT 187  --   --  214  --   APTT  --   --   --  80*  --   LABPROT  --   --   --  16.0*  --   INR  --   --   --  1.27  --   HEPARINUNFRC  --   < > 0.38 0.26* 0.52  CREATININE 1.10  --   --  1.87*  --   TROPONINI <0.03  --   --   --   --   < > = values in this interval not displayed.  Estimated Creatinine Clearance: 38 mL/min (by C-G formula based on Cr of 1.87).   Medications:  Heparin @ 1650 units/hr (16.5 ml/hr)  Assessment: 78 YOM who continues on heparin for a new PE this admit with a therapeutic heparin level this evening after a rate increase this morning (HL 0.52 << 0.26, goal of 0.3-0.7). No bleeding noted.   Goal of Therapy:  Heparin level 0.3-0.7 units/ml Monitor platelets by anticoagulation protocol: Yes   Plan:  1. Continue heparin at 1650 units/hr (16.5 ml/hr) 2. Will continue to monitor for any signs/symptoms of bleeding and will follow up with heparin level in the a.m to confirm  Alycia Rossetti, PharmD, BCPS Clinical Pharmacist Pager: [phone] 04/15/2014 8:57 PM

## 2014-04-15 NOTE — Progress Notes (Signed)
Echo done with definity, no untoward signs and symptoms continue to monitor.

## 2014-04-16 LAB — CBC
HCT: 28.7 % — ABNORMAL LOW (ref 39.0–52.0)
Hemoglobin: 9.2 g/dL — ABNORMAL LOW (ref 13.0–17.0)
MCH: 28.8 pg (ref 26.0–34.0)
MCHC: 32.1 g/dL (ref 30.0–36.0)
MCV: 90 fL (ref 78.0–100.0)
PLATELETS: 224 10*3/uL (ref 150–400)
RBC: 3.19 MIL/uL — ABNORMAL LOW (ref 4.22–5.81)
RDW: 14.5 % (ref 11.5–15.5)
WBC: 6.9 10*3/uL (ref 4.0–10.5)

## 2014-04-16 LAB — BASIC METABOLIC PANEL
Anion gap: 8 (ref 5–15)
BUN: 17 mg/dL (ref 6–23)
CO2: 24 mmol/L (ref 19–32)
CREATININE: 1.11 mg/dL (ref 0.50–1.35)
Calcium: 8.8 mg/dL (ref 8.4–10.5)
Chloride: 103 mEq/L (ref 96–112)
GFR calc Af Amer: 73 mL/min — ABNORMAL LOW (ref >90)
GFR calc non Af Amer: 63 mL/min — ABNORMAL LOW (ref >90)
Glucose, Bld: 88 mg/dL (ref 70–99)
Potassium: 3.4 mmol/L — ABNORMAL LOW (ref 3.5–5.1)
SODIUM: 135 mmol/L (ref 135–145)

## 2014-04-16 LAB — HEPARIN LEVEL (UNFRACTIONATED): Heparin Unfractionated: 0.58 IU/mL (ref 0.30–0.70)

## 2014-04-16 MED ORDER — POTASSIUM CHLORIDE CRYS ER 20 MEQ PO TBCR
40.0000 meq | EXTENDED_RELEASE_TABLET | Freq: Once | ORAL | Status: AC
  Administered 2014-04-16: 40 meq via ORAL
  Filled 2014-04-16: qty 2

## 2014-04-16 MED ORDER — SORBITOL 70 % SOLN
30.0000 mL | Freq: Every day | Status: DC | PRN
  Filled 2014-04-16 (×2): qty 30

## 2014-04-16 MED ORDER — METOPROLOL TARTRATE 25 MG PO TABS: 25.0000 mg | ORAL_TABLET | Freq: Two times a day (BID) | ORAL | Status: DC

## 2014-04-16 MED ORDER — SODIUM CHLORIDE 0.9 % IV SOLN
INTRAVENOUS | Status: DC
  Administered 2014-04-16: 15:00:00 via INTRAVENOUS

## 2014-04-16 MED ORDER — SENNOSIDES-DOCUSATE SODIUM 8.6-50 MG PO TABS
1.0000 | ORAL_TABLET | Freq: Two times a day (BID) | ORAL | Status: DC
  Administered 2014-04-16 – 2014-04-18 (×3): 1 via ORAL
  Filled 2014-04-16 (×7): qty 1

## 2014-04-16 MED ORDER — AMLODIPINE BESYLATE 5 MG PO TABS
5.0000 mg | ORAL_TABLET | Freq: Every day | ORAL | Status: DC
  Administered 2014-04-16 – 2014-04-18 (×3): 5 mg via ORAL
  Filled 2014-04-16 (×3): qty 1

## 2014-04-16 NOTE — Progress Notes (Signed)
Robert Boyle TEAM 1 - Stepdown/ICU TEAM Progress Note  Robert Boyle PHX:[id number] DOB: [birth year] DOA: 04/13/2014 PCP: Chesley Noon, MD  Admit HPI / Brief Narrative: 78 year old gentleman with a past medical history of hypertension, dyslipidemia, coronary artery disease status post coronary artery bypass grafting Dec 2015 who presented to the emergency room on 04/13/2014 with complaints of pleuritic chest pain associated with sinus tachycardia. CT scan of lungs showed pumonary embolus within both main pulmonary arteries, tracking into the pulmonary arteries to all lobes of both lungs. He was started on IV Heparin and admitted to the SDU for close monitoring.   HPI/Subjective: Pt has no new complaints today.  Denies sx of major depression.  Denies cp, n/v, abdom pain, or sob.  Does admit to chronic back pain and constipation.    Assessment/Plan:  Pulmonary Embolism -CT scan showed pulmonary embolus within both main pulmonary arteries, tracking into the pulmonary arteries to all lobes of both lungs. There was evidence of right hearted strain.  -recently hospitalized undergoing CABG x 5 on 03/25/2014 -Case discussed with PCCM, patient would not be candidate for thrombolytics given recent CABG  -Continue IV heparin - CM to investigate copay/preauth for novel agents - pt counseled on options to include risk/benefit coumadin v/s novel agent - plan to transition to oral tx 12/29 if remains hemodynamically stable   Hypotension -became hypotensive overnight 12/26>27 with SBP's in the 80's - responded to IVF -Norvasc and Lisinopril discontinued -BP appears to have stabilized / pt now hypertensive - resume norvasc and follow   Acute Kidney Injury -AM labs 12/27 noted a creatinine of 1.87, increased from 1.1 - likely prerenal azotemia -Lisinopril discontinued -improving w/ volume resuscitation - resume ACE asap  CAD -S/p 5 vessel CABG 03/25/2014  -Troponin within normal  limits -Continue ASA 325 mg PO daily, metoprolol 50 mg PO BID, lisinopril 10 mg PO q daily (when renal fxn stable)  Sinus Tachycardia -HR's improving -secondary to PE  Code Status: FULL Family Communication: no family present at time of exam (wife home sick w/ URI) Disposition Plan: stable for transfer to tele bed   Consultants: TCTS  Procedures: TTE - 12/27 - moderate LVH - EF 55-60% - no WMA - grade 1 DD  Antibiotics: none  DVT prophylaxis: Full anticoag  Objective: Blood pressure 150/62, pulse 101, temperature 98.1 F (36.7 C), temperature source Oral, resp. rate 15, height 6\' 1"  (1.854 m), weight 89.3 kg (196 lb 13.9 oz), SpO2 96 %.  Intake/Output Summary (Last 24 hours) at 04/16/14 1130 Last data filed at 04/16/14 0800  Gross per 24 hour  Intake  666.5 ml  Output    855 ml  Net -188.5 ml   Exam: General: No acute respiratory distress Lungs: Clear to auscultation bilaterally without wheezes or crackles Cardiovascular: Regular rate and rhythm without murmur gallop or rub normal S1 and S2 Abdomen: Nontender, nondistended, soft, bowel sounds positive, no rebound, no ascites, no appreciable mass Extremities: No significant cyanosis, clubbing, or edema bilateral lower extremities  Data Reviewed: Basic Metabolic Panel:  Recent Labs Lab 04/14/14 0050 04/15/14 0230 04/16/14 0505  NA 132* 131* 135  K 4.0 3.8 3.4*  CL 100 101 103  CO2 20 22 24   GLUCOSE 111* 104* 88  BUN 15 20 17   CREATININE 1.10 1.87* 1.11  CALCIUM 9.1 8.8 8.8    Liver Function Tests:  Recent Labs Lab 04/14/14 0050 04/15/14 0230  AST 23 36  ALT 28 34  ALKPHOS 109 114  BILITOT  1.3* 0.7  PROT 6.5 6.4  ALBUMIN 3.1* 2.6*   Coags:  Recent Labs Lab 04/15/14 0230  INR 1.27    Recent Labs Lab 04/15/14 0230  APTT 80*    CBC:  Recent Labs Lab 04/14/14 0050 04/15/14 0230 04/16/14 0505  WBC 12.6* 10.4 6.9  NEUTROABS 10.7*  --   --   HGB 10.8* 9.2* 9.2*  HCT 32.5* 28.7*  28.7*  MCV 91.3 90.5 90.0  PLT 187 214 224    Cardiac Enzymes:  Recent Labs Lab 04/14/14 0050  TROPONINI <0.03    Recent Results (from the past 240 hour(s))  MRSA PCR Screening     Status: None   Collection Time: 04/14/14  3:37 PM  Result Value Ref Range Status   MRSA by PCR NEGATIVE NEGATIVE Final    Comment:        The GeneXpert MRSA Assay (FDA approved for NASAL specimens only), is one component of a comprehensive MRSA colonization surveillance program. It is not intended to diagnose MRSA infection nor to guide or monitor treatment for MRSA infections.      Studies:  Recent x-ray studies have been reviewed in detail by the Attending Physician  Scheduled Meds:  Scheduled Meds: . allopurinol  300 mg Oral Daily  . aspirin  325 mg Oral Daily  . atorvastatin  80 mg Oral Daily  . lidocaine  1 patch Transdermal Q24H  . metoprolol  50 mg Oral BID WC  . pantoprazole  40 mg Oral Daily  . sodium chloride  3 mL Intravenous Q12H    Time spent on care of this patient: 35 mins   Pinki Rottman T , MD   [facility] Office  [phone] Pager - Text Page per Shea Evans as per below:  On-Call/Text Page:      [email or web address]      password [email or web address]  If 7PM-7AM, please contact night-coverage [email or web address] Password [email or web address] 04/16/2014, 11:30 AM   LOS: 3 days

## 2014-04-16 NOTE — Care Management Note (Addendum)
    Page 1 of 1   04/16/2014     12:14:54 PM CARE MANAGEMENT NOTE 04/16/2014  Patient:  Robert Boyle, Robert Boyle   Account Number:  [id number]  Date Initiated:  04/16/2014  Documentation initiated by:  Elissa Hefty  Subjective/Objective Assessment:   adm  w pul embolism     Action/Plan:   lives alone, pcp dr m badger   Anticipated DC Date:     Anticipated DC Plan:  [place]         [facility] Choice  Resumption Of Svcs/PTA Provider   Choice offered to / List presented to:          [facility] arranged  HH-1 RN  [place].   Status of service:   Medicare Important Message given?  YES (If response is "NO", the following Medicare IM given date fields will be blank) Date Medicare IM given:  04/16/2014 Medicare IM given by:  Elissa Hefty Date Additional Medicare IM given:   Additional Medicare IM given by:    Discharge Disposition:    Per UR Regulation:    If discussed at Long Length of Stay Meetings, dates discussed:    Comments:  12/28 1157 debbie Sandria Mcenroe rn,bsn cm sec ck for copay for eliquis-pradaxa-xarelto. [facility] called and they have hhpt and hhrn following them.

## 2014-04-16 NOTE — Progress Notes (Signed)
ANTICOAGULATION CONSULT NOTE - Follow Up Consult  Pharmacy Consult for Heparin Indication: pulmonary embolus  No Known Allergies  Patient Measurements: Height: 6\' 1"  (185.4 cm) Weight: 196 lb 13.9 oz (89.3 kg) IBW/kg (Calculated) : 79.9 Heparin Dosing Weight: 89kg  Vital Signs: Temp: 98.2 F (36.8 C) (12/28 0813) Temp Source: Oral (12/28 0813) BP: 131/58 mmHg (12/28 0813) Pulse Rate: 118 (12/28 0813)  Labs:  Recent Labs  04/14/14 0050  04/15/14 0230 04/15/14 1855 04/16/14 0505  HGB 10.8*  --  9.2*  --  9.2*  HCT 32.5*  --  28.7*  --  28.7*  PLT 187  --  214  --  224  APTT  --   --  80*  --   --   LABPROT  --   --  16.0*  --   --   INR  --   --  1.27  --   --   HEPARINUNFRC  --   < > 0.26* 0.52 0.58  CREATININE 1.10  --  1.87*  --  1.11  TROPONINI <0.03  --   --   --   --   < > = values in this interval not displayed.  Estimated Creatinine Clearance: 64 mL/min (by C-G formula based on Cr of 1.11).   Medications:  Heparin @ 1650 units/hr  Assessment: 78yom s/p CABG 12/2 continues on heparin for acute submassive PE (CT chest 12/26). Heparin level is therapeutic. CBC is stable. No bleeding reported.  Goal of Therapy:  Heparin level 0.3-0.7 units/ml Monitor platelets by anticoagulation protocol: Yes   Plan:  1) Continue heparin at 1650 units/hr 2) Follow up heparin level, CBC in AM 3) Follow up long term [facility] plans  Deboraha Sprang 04/16/2014,10:36 AM

## 2014-04-16 NOTE — Progress Notes (Addendum)
TCTS DAILY ICU PROGRESS NOTE                   [address]            [place] [address]          [phone]        Total Length of Stay:  LOS: 3 days   Subjective: No c/o, feeling better  Objective: Vital signs in last 24 hours: Temp:  [97.2 F (36.2 C)-98.8 F (37.1 C)] 98.2 F (36.8 C) (12/28 0813) Pulse Rate:  [70-118] 118 (12/28 0813) Cardiac Rhythm:  [-] Normal sinus rhythm (12/28 0800) Resp:  [16-21] 19 (12/28 0813) BP: (103-138)/(52-84) 131/58 mmHg (12/28 0813) SpO2:  [94 %-98 %] 95 % (12/28 0813) Weight:  [195 lb 12.3 oz (88.8 kg)-196 lb 13.9 oz (89.3 kg)] 196 lb 13.9 oz (89.3 kg) (12/28 0356)  Filed Weights   04/15/14 0400 04/15/14 1217 04/16/14 0356  Weight: 195 lb 12.3 oz (88.8 kg) 195 lb 12.3 oz (88.8 kg) 196 lb 13.9 oz (89.3 kg)    Weight change: 0 lb (0 kg)   Hemodynamic parameters for last 24 hours:    Intake/Output from previous day: 12/27 0701 - 12/28 0700 In: 1595.5 [P.O.:220; I.V.:1375.5] Out: 855 [Urine:855]  Intake/Output this shift: Total I/O In: 33 [I.V.:33] Out: -   Current Meds: Scheduled Meds: . allopurinol  300 mg Oral Daily  . aspirin  325 mg Oral Daily  . atorvastatin  80 mg Oral Daily  . lidocaine  1 patch Transdermal Q24H  . metoprolol  50 mg Oral BID WC  . pantoprazole  40 mg Oral Daily  . sodium chloride  3 mL Intravenous Q12H   Continuous Infusions: . sodium chloride Stopped (04/15/14 1516)  . heparin 1,650 Units/hr (04/16/14 0800)   PRN Meds:.acetaminophen **OR** acetaminophen, diazepam, menthol-cetylpyridinium, morphine injection, ondansetron **OR** ondansetron (ZOFRAN) IV, oxyCODONE  General appearance: alert, cooperative and no distress Heart: regular rate and rhythm Lungs: dim in right base Extremities: no edema Wound: incis well healed  Lab Results: CBC: Recent Labs  04/15/14 0230 04/16/14 0505  WBC 10.4 6.9  HGB 9.2* 9.2*  HCT 28.7* 28.7*  PLT 214 224   BMET:  Recent Labs  04/15/14 0230 04/16/14 0505  NA 131* 135  K 3.8 3.4*  CL 101 103  CO2 22 24  GLUCOSE 104* 88  BUN 20 17  CREATININE 1.87* 1.11  CALCIUM 8.8 8.8    PT/INR:  Recent Labs  04/15/14 0230  LABPROT 16.0*  INR 1.27   Radiology: No results found.   Assessment/Plan: stable from CT surgery viewpoint with PE, cont management by primary/AC RX     GOLD,WAYNE E 04/16/2014 8:34 AM  I have seen and examined Robert Boyle and agree with the above assessment  and plan.  Grace Isaac MD Beeper [phone] Office [phone] 04/16/2014 2:26 PM

## 2014-04-17 LAB — CBC
HCT: 29.1 % — ABNORMAL LOW (ref 39.0–52.0)
Hemoglobin: 9.3 g/dL — ABNORMAL LOW (ref 13.0–17.0)
MCH: 28.9 pg (ref 26.0–34.0)
MCHC: 32 g/dL (ref 30.0–36.0)
MCV: 90.4 fL (ref 78.0–100.0)
PLATELETS: 224 10*3/uL (ref 150–400)
RBC: 3.22 MIL/uL — ABNORMAL LOW (ref 4.22–5.81)
RDW: 14.5 % (ref 11.5–15.5)
WBC: 5.8 10*3/uL (ref 4.0–10.5)

## 2014-04-17 LAB — HEPARIN LEVEL (UNFRACTIONATED): HEPARIN UNFRACTIONATED: 0.76 [IU]/mL — AB (ref 0.30–0.70)

## 2014-04-17 LAB — BASIC METABOLIC PANEL
Anion gap: 10 (ref 5–15)
BUN: 13 mg/dL (ref 6–23)
CO2: 20 mmol/L (ref 19–32)
CREATININE: 0.83 mg/dL (ref 0.50–1.35)
Calcium: 8.7 mg/dL (ref 8.4–10.5)
Chloride: 107 mEq/L (ref 96–112)
GFR, EST NON AFRICAN AMERICAN: 83 mL/min — AB (ref >90)
Glucose, Bld: 83 mg/dL (ref 70–99)
Potassium: 3.7 mmol/L (ref 3.5–5.1)
Sodium: 137 mmol/L (ref 135–145)

## 2014-04-17 LAB — MAGNESIUM: Magnesium: 1.9 mg/dL (ref 1.5–2.5)

## 2014-04-17 MED ORDER — LISINOPRIL 2.5 MG PO TABS
2.5000 mg | ORAL_TABLET | Freq: Every day | ORAL | Status: DC
  Administered 2014-04-17 – 2014-04-18 (×2): 2.5 mg via ORAL
  Filled 2014-04-17 (×2): qty 1

## 2014-04-17 MED ORDER — ASPIRIN EC 81 MG PO TBEC
81.0000 mg | DELAYED_RELEASE_TABLET | Freq: Every day | ORAL | Status: DC
  Administered 2014-04-18: 81 mg via ORAL
  Filled 2014-04-17: qty 1

## 2014-04-17 MED ORDER — RIVAROXABAN 15 MG PO TABS
15.0000 mg | ORAL_TABLET | Freq: Two times a day (BID) | ORAL | Status: DC
  Administered 2014-04-17 – 2014-04-18 (×3): 15 mg via ORAL
  Filled 2014-04-17 (×4): qty 1

## 2014-04-17 NOTE — Evaluation (Signed)
Physical Therapy Evaluation Patient Details Name: Robert Boyle MRN: [id number] DOB: 1935/05/10 Today's Date: 04/17/2014   History of Present Illness  78 year old gentleman with a past medical history of hypertension, dyslipidemia, coronary artery disease status post coronary artery bypass grafting Dec 2015 who presented to the emergency room on 04/13/2014 with complaints of pleuritic chest pain associated with sinus tachycardia. CT scan of lungs showed pumonary embolus within both main pulmonary arteries, tracking into the pulmonary arteries to all lobes of both lungs.  Clinical Impression  Pt admitted with the above complications. Pt currently with functional limitations due to the deficits listed below (see PT Problem List). Demonstrates generalized weakness with mild balance difficulties while ambulating but improves greatly with use of a rolling walker for support. Ambulated up to 250 feet, HR to 121 bpm, SpO2 96% on room air. Feel he is adequate for d/c from a mobility standpoint when medically ready and will greatly benefit from continued PT at home to further progress functional deficits. Will continue to work with pt acutely while admitted.       Follow Up Recommendations Home health PT;Supervision/Assistance - 24 hour    Equipment Recommendations  None recommended by PT    Recommendations for Other Services       Precautions / Restrictions Precautions Precautions: Sternal Restrictions Weight Bearing Restrictions: Yes Other Position/Activity Restrictions: sternal      Mobility  Bed Mobility Overal bed mobility: Modified Independent                Transfers Overall transfer level: Needs assistance Equipment used: None Transfers: Sit to/from Stand Sit to Stand: Min guard         General transfer comment: min guard for safety. VC for hand placement to maintain sternal precautions. Mild instability upon standing but did not require physical  assist.  Ambulation/Gait Ambulation/Gait assistance: Supervision Ambulation Distance (Feet): 250 Feet Assistive device: Rolling walker (2 wheeled);None Gait Pattern/deviations: Step-through pattern;Drifts right/left;Decreased stride length;Trunk flexed Gait velocity: decreased   General Gait Details: Moderate sway and drift noted without use of an assistive device, trial up to 15 feet. Supervision with a rolling walker with VC for upright posture and forward gaze. No loss of balance noted while using RW.  Stairs            Wheelchair Mobility    Modified Rankin (Stroke Patients Only)       Balance Overall balance assessment: Needs assistance Sitting-balance support: No upper extremity supported;Feet supported Sitting balance-Leahy Scale: Good     Standing balance support: No upper extremity supported Standing balance-Leahy Scale: Fair                               Pertinent Vitals/Pain Pain Assessment: No/denies pain  HR 121 SpO2 96% on room air 2/4 dyspnea    Home Living Family/patient expects to be discharged to:: Private residence Living Arrangements: Spouse/significant other Available Help at Discharge: Family;Available 24 hours/day Type of Home: House Home Access: Stairs to enter Entrance Stairs-Rails: Psychiatric nurse of Steps: 2 Home Layout: Two level;Bed/bath upstairs Home Equipment: Walker - 2 wheels      Prior Function Level of Independence: Independent         Comments: Pt likes to golf with group of retired friends and travel     Engineer, manufacturing Dominance   Dominant Hand: Right    Extremity/Trunk Assessment   Upper Extremity Assessment: Defer to OT evaluation  Lower Extremity Assessment: Generalized weakness      Cervical / Trunk Assessment: Kyphotic  Communication   Communication: No difficulties  Cognition Arousal/Alertness: Awake/alert Behavior During Therapy: WFL for tasks  assessed/performed Overall Cognitive Status: Within Functional Limits for tasks assessed                      General Comments      Exercises        Assessment/Plan    PT Assessment Patient needs continued PT services  PT Diagnosis Difficulty walking;Generalized weakness;Abnormality of gait   PT Problem List Decreased strength;Decreased activity tolerance;Decreased balance;Decreased mobility;Decreased coordination;Decreased knowledge of use of DME;Decreased safety awareness;Decreased knowledge of precautions;Cardiopulmonary status limiting activity  PT Treatment Interventions DME instruction;Gait training;Stair training;Functional mobility training;Therapeutic activities;Therapeutic exercise;Balance training;Patient/family education;Neuromuscular re-education   PT Goals (Current goals can be found in the Care Plan section) Acute Rehab PT Goals Patient Stated Goal: go home soon PT Goal Formulation: With patient Time For Goal Achievement: 05/01/14 Potential to Achieve Goals: Good    Frequency Min 3X/week   Barriers to discharge        Co-evaluation               End of Session Equipment Utilized During Treatment: Gait belt Activity Tolerance: Patient tolerated treatment well Patient left: in bed;with call bell/phone within reach Nurse Communication: Mobility status         Time: 8841-6606 PT Time Calculation (min) (ACUTE ONLY): 26 min   Charges:   PT Evaluation $Initial PT Evaluation Tier I: 1 Procedure PT Treatments $Gait Training: 8-22 mins   PT G CodesEllouise Newer 04/17/2014, 9:28 AM Elayne Snare, [place]

## 2014-04-17 NOTE — Progress Notes (Signed)
chart reviewed  Progress Note  Robert Boyle OJJ:[id number] DOB: [birth year] DOA: 04/13/2014 PCP: Chesley Noon, MD  Admit HPI / Brief Narrative: 78 year old gentleman with a past medical history of hypertension, dyslipidemia, coronary artery disease status post coronary artery bypass grafting Dec 2015 who presented to the emergency room on 04/13/2014 with complaints of pleuritic chest pain associated with sinus tachycardia. CT scan of lungs showed pumonary embolus within both main pulmonary arteries, tracking into the pulmonary arteries to all lobes of both lungs. He was started on IV Heparin and admitted to the SDU for close monitoring.   Assessment/Plan:  Pulmonary Embolism Change to xarelto. Monitor 24 hours discharge tomorrow if stable. Will need home PT  Code Status: FULL Family Communication: d/w wife Disposition Plan:   Consultants: TCTS  Procedures: TTE - 12/27 - moderate LVH - EF 55-60% - no WMA - grade 1 DD   S:  Had 2 stools. Walked with PT. No bleeding.  Objective: Blood pressure 128/64, pulse 89, temperature 98.2 F (36.8 C), temperature source Oral, resp. rate 18, height 6\' 1"  (1.854 m), weight 89.3 kg (196 lb 13.9 oz), SpO2 97 %.  Intake/Output Summary (Last 24 hours) at 04/17/14 1742 Last data filed at 04/17/14 0930  Gross per 24 hour  Intake    240 ml  Output    600 ml  Net   -360 ml   Exam: General: No acute respiratory distress Lungs: Clear to auscultation bilaterally without wheezes or crackles Cardiovascular: Regular rate and rhythm without murmur gallop or rub normal S1 and S2 Abdomen: Nontender, nondistended, soft, bowel sounds positive, no rebound, no ascites, no appreciable mass Extremities: No significant cyanosis, clubbing, or edema bilateral lower extremities  Data Reviewed: Basic Metabolic Panel:  Recent Labs Lab 04/14/14 0050 04/15/14 0230 04/16/14 0505 04/17/14 0605  NA 132* 131* 135 137  K 4.0 3.8 3.4* 3.7  CL 100 101 103  107  CO2 20 22 24 20   GLUCOSE 111* 104* 88 83  BUN 15 20 17 13   CREATININE 1.10 1.87* 1.11 0.83  CALCIUM 9.1 8.8 8.8 8.7  MG  --   --   --  1.9    Liver Function Tests:  Recent Labs Lab 04/14/14 0050 04/15/14 0230  AST 23 36  ALT 28 34  ALKPHOS 109 114  BILITOT 1.3* 0.7  PROT 6.5 6.4  ALBUMIN 3.1* 2.6*   Coags:  Recent Labs Lab 04/15/14 0230  INR 1.27    Recent Labs Lab 04/15/14 0230  APTT 80*    CBC:  Recent Labs Lab 04/14/14 0050 04/15/14 0230 04/16/14 0505 04/17/14 0605  WBC 12.6* 10.4 6.9 5.8  NEUTROABS 10.7*  --   --   --   HGB 10.8* 9.2* 9.2* 9.3*  HCT 32.5* 28.7* 28.7* 29.1*  MCV 91.3 90.5 90.0 90.4  PLT 187 214 224 224    Cardiac Enzymes:  Recent Labs Lab 04/14/14 0050  TROPONINI <0.03    Recent Results (from the past 240 hour(s))  MRSA PCR Screening     Status: None   Collection Time: 04/14/14  3:37 PM  Result Value Ref Range Status   MRSA by PCR NEGATIVE NEGATIVE Final    Comment:        The GeneXpert MRSA Assay (FDA approved for NASAL specimens only), is one component of a comprehensive MRSA colonization surveillance program. It is not intended to diagnose MRSA infection nor to guide or monitor treatment for MRSA infections.      Studies:  Scheduled Meds:  Scheduled Meds: . allopurinol  300 mg Oral Daily  . amLODipine  5 mg Oral Daily  . aspirin  325 mg Oral Daily  . atorvastatin  80 mg Oral Daily  . lidocaine  1 patch Transdermal Q24H  . metoprolol  50 mg Oral BID WC  . pantoprazole  40 mg Oral Daily  . rivaroxaban  15 mg Oral BID  . senna-docusate  1 tablet Oral BID  . sodium chloride  3 mL Intravenous Q12H    Time spent on care of this patient: 25 mins   Delfina Redwood , MD  [facility]  Password [facility] 04/17/2014, 5:42 PM   LOS: 4 days

## 2014-04-17 NOTE — Evaluation (Signed)
Occupational Therapy Evaluation Patient Details Name: GRISELDA TOSH MRN: [id number] DOB: [birth year] Today's Date: 04/17/2014    History of Present Illness 78 year old gentleman with a past medical history of hypertension, dyslipidemia, coronary artery disease status post coronary artery bypass grafting Dec 2015 who presented to the emergency room on 04/13/2014 with complaints of pleuritic chest pain associated with sinus tachycardia. CT scan of lungs showed pumonary embolus within both main pulmonary arteries, tracking into the pulmonary arteries to all lobes of both lungs.   Clinical Impression   Patient admitted with above. Patient independent PTA. Patient currently functioning at a supervision>min guard assist level. Please see OT problem list below. Feel patient will benefit from acute OT to increase overall independence in the areas of ADLs & functional mobility and in order to safely discharge home. No OT follow-up recommended at this time, believe patient will need 24/7 supervision once discharged > home; patient stated he lives with wife who can provide this.    Follow Up Recommendations  No OT follow up;Supervision/Assistance - 24 hour    Equipment Recommendations  None recommended by OT, patient states he has a shower seat.    Recommendations for Other Services  none at this time     Precautions / Restrictions Precautions Precautions: Sternal Precaution Comments: Pt able to verbalize precautions independently and adhere to these independently  Restrictions Weight Bearing Restrictions: Yes Other Position/Activity Restrictions: sternal      Mobility Bed Mobility Overal bed mobility: Modified Independent  Transfers Overall transfer level: Needs assistance Equipment used: None Transfers: Sit to/from Stand Sit to Stand: Min guard General transfer comment: min guard for safety. VC for hand placement to maintain sternal precautions. Mild instability upon standing but  did not require physical assist.    Balance Overall balance assessment: Needs assistance Sitting-balance support: No upper extremity supported;Feet unsupported Sitting balance-Leahy Scale: Good Standing balance-Leahy Scale: Fair    ADL Overall ADL's : Needs assistance/impaired Eating/Feeding: Independent   Grooming: Wash/dry hands;Wash/dry face;Oral care;Min guard   Toilet Transfer: Min guard   General ADL Comments: Patient overall min guard for ADLs at this time. Will continue to follow patient for acute OT needs. No recommeneded follow-up OT, do recommend 24/7 supervision at home.      Vision  No apparent visual impairments    Perception Perception Perception Tested?: No   Praxis Praxis Praxis tested?: Within functional limits    Pertinent Vitals/Pain Pain Assessment: 0-10 Pain Score: 2  Pain Location: back Pain Descriptors / Indicators: Aching Pain Intervention(s): Monitored during session     Hand Dominance Right   Extremity/Trunk Assessment Upper Extremity Assessment Upper Extremity Assessment: Overall WFL for tasks assessed   Lower Extremity Assessment Lower Extremity Assessment: Defer to PT evaluation   Cervical / Trunk Assessment Cervical / Trunk Assessment: Kyphotic   Communication Communication Communication: No difficulties   Cognition Arousal/Alertness: Awake/alert Behavior During Therapy: WFL for tasks assessed/performed Overall Cognitive Status: Within Functional Limits for tasks assessed               Home Living Family/patient expects to be discharged to:: Private residence Living Arrangements: Spouse/significant other Available Help at Discharge: Family;Available 24 hours/day Type of Home: House Home Access: Stairs to enter [employer] of Steps: 2 Entrance Stairs-Rails: Right;Left Home Layout: Two level;Bed/bath upstairs Alternate Level Stairs-Number of Steps: flight Alternate Level Stairs-Rails: Right;Left Bathroom  Shower/Tub: Walk-in shower;Door;Tub only   Biochemist, clinical: Standard Bathroom Accessibility: Yes How Accessible: Accessible via walker Home Equipment: Harvey - 2 wheels;Shower  seat   Additional Comments: Patient reports "son fixed something up" when talking about shower seat      Prior Functioning/Environment Level of Independence: Independent        Comments: Pt likes to golf with group of retired friends and travel    OT Diagnosis: Generalized weakness;Acute pain   OT Problem List: Decreased strength;Decreased activity tolerance;Impaired balance (sitting and/or standing);Decreased safety awareness   OT Treatment/Interventions: Self-care/ADL training;Therapeutic exercise;Energy conservation;Therapeutic activities;Patient/family education;Balance training    OT Goals(Current goals can be found in the care plan section) Acute Rehab OT Goals Patient Stated Goal: none stated OT Goal Formulation: With patient Time For Goal Achievement: 05/01/14 Potential to Achieve Goals: Good  OT Frequency: Min 2X/week   Barriers to D/C: none known at this time          End of Session Equipment Utilized During Treatment: Gait belt  Activity Tolerance: Patient tolerated treatment well Patient left: in chair;with call bell/phone within reach   Time: 1240-1300 OT Time Calculation (min): 20 min Charges:  OT General Charges $OT Visit: 1 Procedure OT Evaluation $Initial OT Evaluation Tier I: 1 Procedure OT Treatments $Self Care/Home Management : 8-22 mins  Shaira Sova , MS, OTR/L, CLT  04/17/2014, 1:07 PM

## 2014-04-17 NOTE — Progress Notes (Signed)
ANTICOAGULATION CONSULT NOTE - Follow Up Consult  Pharmacy Consult for Heparin Indication: pulmonary embolus  No Known Allergies  Patient Measurements: Height: 6\' 1"  (185.4 cm) Weight: 196 lb 13.9 oz (89.3 kg) IBW/kg (Calculated) : 79.9 Heparin Dosing Weight: 89kg  Vital Signs: Temp: 98.2 F (36.8 C) (12/29 0436) Temp Source: Oral (12/29 0436) BP: 128/64 mmHg (12/29 0436) Pulse Rate: 89 (12/29 0436)  Labs:  Recent Labs  04/15/14 0230 04/15/14 1855 04/16/14 0505 04/17/14 0605  HGB 9.2*  --  9.2* 9.3*  HCT 28.7*  --  28.7* 29.1*  PLT 214  --  224 224  APTT 80*  --   --   --   LABPROT 16.0*  --   --   --   INR 1.27  --   --   --   HEPARINUNFRC 0.26* 0.52 0.58 0.76*  CREATININE 1.87*  --  1.11 0.83    Estimated Creatinine Clearance: 85.6 mL/min (by C-G formula based on Cr of 0.83).  Medications:  Heparin @ 1650 units/hr  Assessment: Robert Boyle s/p CABG 12/2 continues on heparin for acute submassive PE (CT chest 12/26). Heparin level is slightly supratherapeutic. CBC is stable and no bleeding noted.   Goal of Therapy:  Heparin level 0.3-0.7 units/ml Monitor platelets by anticoagulation protocol: Yes   Plan:  1. Reduce heparin gtt slightly to 1600 units/hr 2. F/u AM heparin level and CBC 3. F/u plans for oral anticoagulation  Salome Arnt, PharmD, BCPS Pager # [phone] 04/17/2014 8:12 AM

## 2014-04-17 NOTE — Discharge Instructions (Addendum)
Information on my medicine - XARELTO (rivaroxaban)  This medication education was reviewed with me or my healthcare representative as part of my discharge preparation.    WHY WAS XARELTO PRESCRIBED FOR YOU? Xarelto was prescribed to treat blood clots that may have been found in the veins of your legs (deep vein thrombosis) or in your lungs (pulmonary embolism) and to reduce the risk of them occurring again.  What do you need to know about Xarelto? The starting dose is one 15 mg tablet taken TWICE daily with food for the FIRST 21 DAYS then on (enter date)  05/08/14  the dose is changed to one 20 mg tablet taken ONCE A DAY with your evening meal.  DO NOT stop taking Xarelto without talking to the health care provider who prescribed the medication.  Refill your prescription for 20 mg tablets before you run out.  After discharge, you should have regular check-up appointments with your healthcare provider that is prescribing your Xarelto.  In the future your dose may need to be changed if your kidney function changes by a significant amount.  What do you do if you miss a dose? If you are taking Xarelto TWICE DAILY and you miss a dose, take it as soon as you remember. You may take two 15 mg tablets (total 30 mg) at the same time then resume your regularly scheduled 15 mg twice daily the next day.  If you are taking Xarelto ONCE DAILY and you miss a dose, take it as soon as you remember on the same day then continue your regularly scheduled once daily regimen the next day. Do not take two doses of Xarelto at the same time.   Important Safety Information Xarelto is a blood thinner medicine that can cause bleeding. You should call your healthcare provider right away if you experience any of the following: ? Bleeding from an injury or your nose that does not stop. ? Unusual colored urine (red or dark brown) or unusual colored stools (red or black). ? Unusual bruising for unknown reasons. ? A  serious fall or if you hit your head (even if there is no bleeding).  Some medicines may interact with Xarelto and might increase your risk of bleeding while on Xarelto. To help avoid this, consult your healthcare provider or pharmacist prior to using any new prescription or non-prescription medications, including herbals, vitamins, non-steroidal anti-inflammatory drugs (NSAIDs) and supplements.  This website has more information on Xarelto: [email or web address]

## 2014-04-18 LAB — CBC
HCT: 28.8 % — ABNORMAL LOW (ref 39.0–52.0)
HEMOGLOBIN: 9.1 g/dL — AB (ref 13.0–17.0)
MCH: 28.4 pg (ref 26.0–34.0)
MCHC: 31.6 g/dL (ref 30.0–36.0)
MCV: 90 fL (ref 78.0–100.0)
PLATELETS: 229 10*3/uL (ref 150–400)
RBC: 3.2 MIL/uL — ABNORMAL LOW (ref 4.22–5.81)
RDW: 14.4 % (ref 11.5–15.5)
WBC: 5.5 10*3/uL (ref 4.0–10.5)

## 2014-04-18 MED ORDER — RIVAROXABAN (XARELTO) VTE STARTER PACK (15 & 20 MG): ORAL_TABLET | ORAL | Status: DC

## 2014-04-18 MED ORDER — ASPIRIN 81 MG PO TBEC: 81.0000 mg | DELAYED_RELEASE_TABLET | Freq: Every day | ORAL | Status: DC

## 2014-04-18 MED ORDER — ACETAMINOPHEN 325 MG PO TABS: 650.0000 mg | ORAL_TABLET | Freq: Four times a day (QID) | ORAL | Status: DC | PRN

## 2014-04-18 NOTE — Progress Notes (Addendum)
04/18/2014 10:58 AM Nursing note Contacted by pt. Wife and daughter concerned about pt. Prior to admission with regards to possible depression. Pt. Has not expressed any outward symptoms of depression towards staff during admission. Dr. Conley Canal given family contact information to contact them regarding their concerns and discharge needs. Will continue to closely monitor patient.  Kamyla Olejnik, Arville Lime

## 2014-04-18 NOTE — Discharge Summary (Signed)
Physician Discharge Summary  Robert Boyle NLG:[id number] DOB: 05/04/1935 DOA: 04/13/2014  PCP: Chesley Noon, MD  Admit date: 04/13/2014 Discharge date: 04/18/2014  Time spent: greater than 30 minutes  Recommendations for Outpatient Follow-up:  1. Monitor BMET, blood pressure, h/h  Discharge Diagnoses:  Principal Problem:   Acute pulmonary embolism Active Problems:   Essential hypertension   CAD (coronary artery disease)   S/P CABG x 5: LIMA-LAD, SVG-D1-D2, SVG-RPDA-RPL hypotension Acute renal failue  Discharge Condition: stable  Filed Weights   04/16/14 0356 04/17/14 0436 04/18/14 0516  Weight: 89.3 kg (196 lb 13.9 oz) 89.3 kg (196 lb 13.9 oz) 89.5 kg (197 lb 5 oz)    History of present illness/Hospital Course:  78 year old gentleman with a past medical history of hypertension, dyslipidemia, coronary artery disease status post coronary artery bypass grafting Dec 2015 who presented to the emergency room on 04/13/2014 with complaints of pleuritic chest pain associated with sinus tachycardia. CT scan of lungs showed pumonary embolus within both main pulmonary arteries, tracking into the pulmonary arteries to all lobes of both lungs. He was started on IV Heparin and admitted to the SDU for close monitoring.   Pulmonary Embolism -CT scan showed pulmonary embolus within both main pulmonary arteries, tracking into the pulmonary arteries to all lobes of both lungs. There was evidence of right hearted strain.  -recently hospitalized undergoing CABG x 5 on 03/25/2014 Transitioned to [employer]. Icalled to [place].  Hypotension -became hypotensive overnight 12/26>27 with SBP's in the 80's - responded to IVF -Norvasc and Lisinopril held transiently, and resumed sequentially without further hypotension  Acute Kidney Injury -AM labs 12/27 noted a creatinine of 1.87, increased from 1.1 - likely prerenal azotemia -Lisinopril held. Improved with hydration. May resume lisinopril  at discharge  CAD -S/p 5 vessel CABG 03/25/2014  -Troponin within normal limits -Continue ASA 325 mg PO daily, metoprolol 50 mg PO BID, lisinopril 10 mg PO q daily  Consultants: TCTS  Procedures: TTE - 12/27 - moderate LVH - EF 55-60% - no WMA - grade 1 DD  Discharge Exam: Filed Vitals:   04/18/14 0952  BP: 127/68  Pulse:   Temp:   Resp:     General: in chair. comfortable Cardiovascular: RRR Respiratory: CTA  Discharge Instructions   Discharge Instructions    Diet - low sodium heart healthy    Complete by:  As directed      Discharge instructions    Complete by:  As directed   Avoid ibuprofen, naprosyn Tylenol is safe. Change aspirin to 81 mg daily.     Increase activity slowly    Complete by:  As directed           Current Discharge Medication List    START taking these medications   Details  acetaminophen (TYLENOL) 325 MG tablet Take 2 tablets (650 mg total) by mouth every 6 (six) hours as needed for mild pain (or Fever >/= 101).    Rivaroxaban (XARELTO STARTER PACK) 15 & 20 MG TBPK Take as directed on package: Start with one 15mg  tablet by mouth twice a day with food. On Day 22, switch to one 20mg  tablet once a day with food. Qty: 51 each, Refills: 0      CONTINUE these medications which have CHANGED   Details  aspirin EC 81 MG EC tablet Take 1 tablet (81 mg total) by mouth daily.      CONTINUE these medications which have NOT CHANGED   Details  allopurinol (ZYLOPRIM) 300 MG  tablet Take 300 mg by mouth daily.    amLODipine (NORVASC) 5 MG tablet Take 5 mg by mouth daily.    atorvastatin (LIPITOR) 80 MG tablet Take 80 mg by mouth daily.    ferrous [id number] C-folic acid (TRINSICON / FOLTRIN) capsule Take 1 capsule by mouth daily with breakfast. For one month then stop. Qty: 30 capsule, Refills: 0    furosemide (LASIX) 20 MG tablet Take 20 mg by mouth daily.    lisinopril (PRINIVIL,ZESTRIL) 10 MG tablet Take 1 tablet (10 mg total) by  mouth daily. Qty: 30 tablet, Refills: 1    metoprolol (LOPRESSOR) 50 MG tablet Take 1 tablet (50 mg total) by mouth 2 (two) times daily. Qty: 60 tablet, Refills: 1    Multiple Vitamin (MULTIVITAMIN WITH MINERALS) TABS tablet Take 1 tablet by mouth daily.    omeprazole (PRILOSEC) 20 MG capsule Take 20 mg by mouth daily.    oxyCODONE (OXY IR/ROXICODONE) 5 MG immediate release tablet Take 1-2 tablets (5-10 mg total) by mouth every 4 (four) hours as needed for severe pain. Qty: 50 tablet, Refills: 0   Associated Diagnoses: Post-op pain      STOP taking these medications     diazepam (VALIUM) 2 MG tablet      alprazolam (XANAX) 2 MG tablet        No Known Allergies Follow-up Information    Follow up with BADGER,MICHAEL C, MD In 2 weeks.   Specialty:  Family Medicine   Contact information:   [place] [address] [phone]        The results of significant diagnostics from this hospitalization (including imaging, microbiology, ancillary and laboratory) are listed below for reference.    Significant Diagnostic Studies: Dg Chest 2 View  04/14/2014   CLINICAL DATA:  Right-sided abdominal pain for 2 days  EXAM: CHEST  2 VIEW  COMPARISON:  Chest radiograph 03/24/2014  FINDINGS: Sternotomy wires overlie normal cardiac silhouette. There is chronic elevation the right hemidiaphragm with associated right lower lobe atelectasis. No effusion, infiltrate, or pneumothorax.  IMPRESSION: 1. No clear acute cardiopulmonary findings. 2. Chronic elevation right hemidiaphragm with associated atelectasis.   Electronically Signed   By: [name]   On: 04/14/2014 00:32   Dg Chest 2 View  03/24/2014   CLINICAL DATA:  Postop CABG. History of hypertension and smoking. Initial encounter.  EXAM: CHEST  2 VIEW  COMPARISON:  Radiographs 03/22/2014 and 03/23/2014.  FINDINGS: 0647 hr. Interval removal of the right IJ sheath. There is persistent eventration of the right  hemidiaphragm with associated right basilar atelectasis. There is mild residual left lower lobe atelectasis, also improved. Small bilateral pleural effusions are present. There is no pneumothorax. The heart size and mediastinal contours are stable status post CABG.  IMPRESSION: Interval improved aeration of both lung bases.  No pneumothorax.   Electronically Signed   By: Camie Patience M.D.   On: 03/24/2014 10:05   Dg Chest 2 View  03/20/2014   CLINICAL DATA:  Shortness of breath.  EXAM: CHEST  2 VIEW  COMPARISON:  01/22/2010.  FINDINGS: Mediastinum and hilar structures are normal. Subsegmental atelectasis both lung bases. No focal alveolar infiltrate. No pleural effusion or pneumothorax. Stable apical pleural thickening noted consistent with scarring. Stable elevation right hemidiaphragm. Heart size normal. No acute bony abnormality.  IMPRESSION: Subsegmental atelectasis both lung bases. Stable elevation right hemidiaphragm   Electronically Signed   ByMarcello Moores  Register   On: 03/20/2014 08:58  Ct Angio Chest Pe W/cm &/or Wo Cm  04/14/2014   CLINICAL DATA:  Acute onset of generalized chest pain and back pain. Shortness of breath. Recent CABG. Initial encounter.  EXAM: CT ANGIOGRAPHY CHEST WITH CONTRAST  TECHNIQUE: Multidetector CT imaging of the chest was performed using the standard protocol during bolus administration of intravenous contrast. Multiplanar CT image reconstructions and MIPs were obtained to evaluate the vascular anatomy.  CONTRAST:  4mL OMNIPAQUE IOHEXOL 350 MG/ML SOLN  COMPARISON:  Chest radiograph performed earlier today at 12:07 a.m.  FINDINGS: There is pulmonary embolus within both main pulmonary arteries, tracking into the pulmonary arteries to all lobes of both lungs. The RV/LV ratio of 1.4 corresponds to right heart strain and concern for submassive pulmonary embolus.  Focal right basilar airspace opacification is most compatible with pulmonary infarct. Scattered blebs are noted at the  lung apices. Trace left basilar pleural fluid is noted, with mild associated atelectasis. There is no evidence of pneumothorax. No masses are identified; no abnormal focal contrast enhancement is seen.  A trace pericardial effusion is noted. Diffuse coronary artery calcification is seen. The patient is status post median sternotomy. No mediastinal lymphadenopathy is seen. Scattered calcific atherosclerotic disease is noted along the aortic arch and proximal great vessels. There is mild dilatation of the ascending thoracic aorta, measuring up to 3.8 cm, without evidence of aneurysmal dilatation. No axillary lymphadenopathy is seen. The visualized portions of the thyroid gland are unremarkable in appearance.  The visualized portions of the liver and spleen are unremarkable. The visualized portions of the pancreas, stomach, adrenal glands and kidneys are within normal limits. Nonspecific perinephric stranding is noted bilaterally. The patient is status post cholecystectomy, with clips noted at the gallbladder fossa.  No acute osseous abnormalities are seen.  Review of the MIP images confirms the above findings.  IMPRESSION: 1. Pulmonary embolus within both main pulmonary arteries, tracking into the pulmonary arteries to all lobes of both lungs. RV/LV ratio of 1.4 corresponds to right heart strain and concern for submassive pulmonary embolus. 2. Focal right basilar pulmonary infarct noted. 3. Trace left basilar pleural fluid, with mild associated atelectasis. 4. Scattered blebs at the lung apices. 5. Trace pericardial effusion seen. 6. Diffuse coronary artery calcification noted. 7. Mild dilatation of the ascending thoracic aorta to 3.8 cm, without evidence of aneurysmal dilatation.  Critical Value/emergent results were called by telephone at the time of interpretation on 04/14/2014 at 2:40 am to Dr. Delora Fuel, who verbally acknowledged these results.   Electronically Signed   By: [name]   On: 04/14/2014  02:45   Dg Chest Port 1 View  03/23/2014   CLINICAL DATA:  Post CABG procedure. History of coronary artery disease.  EXAM: PORTABLE CHEST - 1 VIEW  COMPARISON:  03/22/2014  FINDINGS: Swan-Ganz catheter has been removed but the right jugular central line is still present. There is marked elevation of the right hemidiaphragm with associated volume loss in the right lung. Again noted is a left chest tube without a pneumothorax. Persistent consolidation in the retrocardiac space. Mediastinal drain has been removed.  IMPRESSION: Marked volume loss in the right lung with elevation of the right hemidiaphragm.  Persistent consolidation at the left lung base.  No evidence for a pneumothorax.   Electronically Signed   By: Markus Daft M.D.   On: 03/23/2014 08:47   Dg Chest Port 1 View  03/22/2014   CLINICAL DATA:  Status post CABG  EXAM: PORTABLE CHEST - 1 VIEW  COMPARISON:  Portable chest x-ray of March 21, 2014  FINDINGS: The trachea and esophagus have been extubated. There is persistent volume loss on the right with elevation of the hemidiaphragm. There is no pneumothorax on the right. On the left the lung is better inflated. A chest tube is present overlying the lateral aspect of the fifth rib. There is no pneumothorax or significant pleural effusion. The retrocardiac region on the left remains dense. The cardiac silhouette where visualized is mildly enlarged. A Swan-Ganz catheter tip lies in the region of the proximal main pulmonary artery. There are 7 intact sternal wires. A mediastinal drain is present with the tip overlying the inferior aspect of the T4 vertebral body.  IMPRESSION: Stable post CABG changes without evidence of a pneumothorax. The trachea is been extubated. The left-sided chest tube and the mediastinal drain are unchanged.   Electronically Signed   By: David  Martinique   On: 03/22/2014 08:12   Dg Chest Port 1 View  03/21/2014   CLINICAL DATA:  Postop for CABG.  Evaluate support apparatus.  EXAM:  PORTABLE CHEST - 1 VIEW  COMPARISON:  One day prior  FINDINGS: Endotracheal tube terminates 5.9 cm above carina. A right IJ Cordis sheath terminates at the pulmonary outflow tract. Nasogastric tube extends beyond the inferior aspect of the film. Left-sided chest tube.  Interval median sternotomy. Cardiomegaly accentuated by AP portable technique. Right hemidiaphragm elevation is moderate. Probable small layering bilateral pleural effusions. No pneumothorax. Low lung volumes with resultant pulmonary interstitial prominence. Patchy bibasilar airspace disease, new or increased.  IMPRESSION: Expected appearance after median sternotomy.  Swan-Ganz catheter with tip at pulmonary outflow tract. These results will be called to the ordering clinician or representative by the Radiologist Assistant, and communication documented in the PACS or zVision Dashboard.  Probable layering bilateral pleural effusions with bibasilar atelectasis.  No pneumothorax.   Electronically Signed   By: Abigail Miyamoto M.D.   On: 03/21/2014 14:59    Microbiology: Recent Results (from the past 240 hour(s))  MRSA PCR Screening     Status: None   Collection Time: 04/14/14  3:37 PM  Result Value Ref Range Status   MRSA by PCR NEGATIVE NEGATIVE Final    Comment:        The GeneXpert MRSA Assay (FDA approved for NASAL specimens only), is one component of a comprehensive MRSA colonization surveillance program. It is not intended to diagnose MRSA infection nor to guide or monitor treatment for MRSA infections.      Labs: Basic Metabolic Panel:  Recent Labs Lab 04/14/14 0050 04/15/14 0230 04/16/14 0505 04/17/14 0605  NA 132* 131* 135 137  K 4.0 3.8 3.4* 3.7  CL 100 101 103 107  CO2 20 22 24 20   GLUCOSE 111* 104* 88 83  BUN 15 20 17 13   CREATININE 1.10 1.87* 1.11 0.83  CALCIUM 9.1 8.8 8.8 8.7  MG  --   --   --  1.9   Liver Function Tests:  Recent Labs Lab 04/14/14 0050 04/15/14 0230  AST 23 36  ALT 28 34  ALKPHOS  109 114  BILITOT 1.3* 0.7  PROT 6.5 6.4  ALBUMIN 3.1* 2.6*   No results for input(s): LIPASE, AMYLASE in the last 168 hours. No results for input(s): AMMONIA in the last 168 hours. CBC:  Recent Labs Lab 04/14/14 0050 04/15/14 0230 04/16/14 0505 04/17/14 0605 04/18/14 0500  WBC 12.6* 10.4 6.9 5.8 5.5  NEUTROABS 10.7*  --   --   --   --  HGB 10.8* 9.2* 9.2* 9.3* 9.1*  HCT 32.5* 28.7* 28.7* 29.1* 28.8*  MCV 91.3 90.5 90.0 90.4 90.0  PLT 187 214 224 224 229   Cardiac Enzymes:  Recent Labs Lab 04/14/14 0050  TROPONINI <0.03   BNP: BNP (last 3 results) No results for input(s): PROBNP in the last 8760 hours. CBG: No results for input(s): GLUCAP in the last 168 hours.     SignedDelfina Redwood  [facility] 04/18/2014, 10:21 AM

## 2014-04-18 NOTE — Progress Notes (Signed)
D/c avs form, medications already taken today and those due this evening given and explained to patient. Xarelto taper reviewed with patient and family using teach back method and pt. Able to perform return verbalization of instructions.  Follow up appointments and when to call MD reviewed. RX reviewed. D/c iv. D/c tele. D/c home per orders. Robert Boyle, Robert Boyle

## 2014-04-18 NOTE — Progress Notes (Signed)
CARE MANAGEMENT NOTE 04/18/2014  Patient:  Robert Boyle, Robert Boyle   Account Number:  [id number]  Date Initiated:  04/16/2014  Documentation initiated by:  Elissa Hefty  Subjective/Objective Assessment:   adm  w pul embolism     Action/Plan:   lives alone, pcp dr m badger   Anticipated DC Date:  04/18/2014   Anticipated DC Plan:  [place]         [facility] Choice  Resumption Of Svcs/PTA Provider   Choice offered to / List presented to:          [facility] arranged  HH-1 RN  [place].   Status of service:  Completed, signed off Medicare Important Message given?  YES (If response is "NO", the following Medicare IM given date fields will be blank) Date Medicare IM given:  04/16/2014 Medicare IM given by:  Elissa Hefty Date Additional Medicare IM given:   Additional Medicare IM given by:    Discharge Disposition:  [place]  Per UR Regulation:  Reviewed for med. necessity/level of care/duration of stay  If discussed at [place] of Stay Meetings, dates discussed:    Comments:  04/18/14- [place] RN, BSN - Pt to go home on Xarelto- spoke with pt informed of copay cost- and 30 day free card given to pt. - [place] notified of pt's discharge- resumption orders in epic.  12/28 1157 debbie dowell rn,bsn cm sec ck for copay for eliquis-pradaxa-xarelto. [facility] called and they have hhpt and hhrn following them. will have 45.00 pe rmonth copay for either of above meds and prior auth not req

## 2014-04-19 ENCOUNTER — Ambulatory Visit: Payer: Medicare HMO | Admitting: Cardiology

## 2014-04-23 ENCOUNTER — Ambulatory Visit: Payer: Medicare HMO | Admitting: Cardiovascular Disease

## 2014-04-23 ENCOUNTER — Telehealth: Payer: Self-pay | Admitting: Cardiology

## 2014-04-23 NOTE — Telephone Encounter (Signed)
Caitlin calling from [facility] to discuss pt medication issue. Noted patient taking Valium for sleep which was prescribed by Dr. Ellyn Hack 03/28/14 via telephone call. Advised Dr. Ellyn Hack had written rx w/ no refills and that most appropriate follow up would be with PCP.  [facility] states pt to take OTC melatonin, OK'd by PCP, and has f/u scheduled w/ PCP this week.  I discussed w/ [facility], patient understands PCP will discuss medication management including Valium Rx.

## 2014-04-25 ENCOUNTER — Other Ambulatory Visit: Payer: Self-pay | Admitting: Cardiothoracic Surgery

## 2014-04-25 DIAGNOSIS — I2699 Other pulmonary embolism without acute cor pulmonale: Secondary | ICD-10-CM

## 2014-04-26 ENCOUNTER — Ambulatory Visit: Payer: Medicare HMO | Admitting: Cardiothoracic Surgery

## 2014-04-30 ENCOUNTER — Ambulatory Visit
Admission: RE | Admit: 2014-04-30 | Discharge: 2014-04-30 | Disposition: A | Discharge status: Home / Self Care | Payer: Medicare HMO | Source: Ambulatory Visit | Attending: Cardiothoracic Surgery | Admitting: Cardiothoracic Surgery

## 2014-04-30 ENCOUNTER — Ambulatory Visit (INDEPENDENT_AMBULATORY_CARE_PROVIDER_SITE_OTHER): Payer: Self-pay | Admitting: Surgical

## 2014-04-30 VITALS — BP 95/63 | HR 92 | Resp 20 | Ht 73.0 in | Wt 196.0 lb

## 2014-04-30 DIAGNOSIS — Z951 Presence of aortocoronary bypass graft: Secondary | ICD-10-CM

## 2014-04-30 DIAGNOSIS — I2699 Other pulmonary embolism without acute cor pulmonale: Secondary | ICD-10-CM

## 2014-04-30 NOTE — Patient Instructions (Signed)
Verbal instructions given to the patient's regarding ongoing recovery and rehabilitation.

## 2014-04-30 NOTE — Progress Notes (Signed)
[address]       [place] [address]             [phone]                  Lukasz P [place] Record #[id number] Date of Birth: 21-Nov-1935  Referring OJ:[id number], [name], MD Primary Cardiology: Primary Care:BADGER,MICHAEL C, MD  Chief Complaint:  Follow Up Visit  OPERATIVE REPORT   PREOPERATIVE DIAGNOSIS: New-onset angina with three-vessel coronary artery disease and positive stress test.  POSTOPERATIVE DIAGNOSIS: New-onset angina with three-vessel coronary artery disease and positive stress test.  SURGICAL PROCEDURE: Coronary artery bypass grafting x5 with the left internal mammary to the left anterior descending coronary artery, sequential reverse saphenous vein graft to the first and second diagonal coronary artery, sequential reverse saphenous vein graft to the posterior descending and posterolateral branches of the right coronary artery with right thigh and calf endo vein harvesting.  SURGEON: Lanelle Bal, MD  History of Present Illness:      The patient is seen in routine office follow-up following his above procedure. It is noted that he did have a recent hospitalization where he was found to have pulmonary embolisms and is now on xarelto. The patient primarily complains of significant fatigue. He denies fevers, chills or other constitutional symptoms. He has been somewhat depressed and has been started on Zoloft by his primary care physician 04/24/2014. Additionally a home nurses noted that he has been somewhat hypotensive and medications were adjusted by his neighbor Dr. Aldona Bar. His beta blocker was decreased as well as his Norvasc. Systolic blood pressure in the office today is 95. He denies significant pain. He is not bringing up any sputum. He has no significant cough.      Zubrod Score: At the time of surgery this patient's most appropriate activity status/level should be  described as: []     0    Normal activity, no symptoms []     1    Restricted in physical strenuous activity but ambulatory, able to do out light work []     2    Ambulatory and capable of self care, unable to do work activities, up and about                 >50 % of waking hours                                                                                   []     3    Only limited self care, in bed greater than 50% of waking hours []     4    Completely disabled, no self care, confined to bed or chair []     5    Moribund  History  Smoking status  . Former Smoker -- 2.00 packs/day for 25 years  . Quit date: 04/20/1983  Smokeless tobacco  . Never Used       No Known Allergies  Current Outpatient Prescriptions  Medication Sig Dispense Refill  . allopurinol (ZYLOPRIM) 300 MG tablet Take 300 mg by mouth daily.    Marland Kitchen amLODipine (NORVASC) 5 MG  tablet Take 2.5 mg by mouth daily.     Marland Kitchen aspirin EC 81 MG EC tablet Take 1 tablet (81 mg total) by mouth daily.    Marland Kitchen atorvastatin (LIPITOR) 80 MG tablet Take 80 mg by mouth daily.    . furosemide (LASIX) 20 MG tablet Take 20 mg by mouth daily.    Marland Kitchen lisinopril (PRINIVIL,ZESTRIL) 10 MG tablet Take 1 tablet (10 mg total) by mouth daily. 30 tablet 1  . metoprolol (LOPRESSOR) 50 MG tablet Take 1 tablet (50 mg total) by mouth 2 (two) times daily. (Patient taking differently: Take 25 mg by mouth 2 (two) times daily. ) 60 tablet 1  . Multiple Vitamin (MULTIVITAMIN WITH MINERALS) TABS tablet Take 1 tablet by mouth daily.    Marland Kitchen omeprazole (PRILOSEC) 20 MG capsule Take 40 mg by mouth daily.     . Rivaroxaban (XARELTO STARTER PACK) 15 & 20 MG TBPK Take as directed on package: Start with one 15mg  tablet by mouth twice a day with food. On Day 22, switch to one 20mg  tablet once a day with food. 51 each 0  . sertraline (ZOLOFT) 50 MG tablet   0   No current facility-administered medications for this visit.       Physical Exam: BP 95/63 mmHg  Pulse 92  Resp 20   Ht 6\' 1"  (1.854 m)  Wt 196 lb (88.905 kg)  BMI 25.86 kg/m2  SpO2 95%  General appearance: alert, cooperative and no distress Heart: regular rate and rhythm Lungs: dim in right base Abdomen: Benign exam Extremities: extremities normal, atraumatic, no cyanosis or edema Wound: Incisions healing well without evidence of infection. Wounds:  Diagnostic Studies & Laboratory data:         Recent Radiology Findings: Dg Chest 2 View  04/30/2014   CLINICAL DATA:  Status post CABG 5 weeks ago; shortness of breath today; chronic generalize weakness since surgery  EXAM: CHEST  2 VIEW  COMPARISON:  PA and lateral chest x-ray dated December 5th 2015.  FINDINGS: The left lung is mildly hyperinflated and clear. On the right there is chronic elevation of the hemidiaphragm. There is subsegmental atelectasis or early pneumonia in the posterior aspect of the right lower lobe. There is stable apical pleural thickening bilaterally. The cardiac silhouette is normal in size. There are 7 intact sternal wires. The pulmonary vascularity is not engorged. There is no pleural effusion or pneumothorax. There is mild tortuosity of the descending thoracic aorta.  IMPRESSION: 1. Elevated right hemidiaphragm with interval development of atelectasis or pneumonia posteriorly in the right lower lobe. The left lung is clear. 2. There is no evidence of CHF. 3. There is no pleural effusion.   Electronically Signed   By: David  Martinique   On: 04/30/2014 12:57      Recent Labs: Lab Results  Component Value Date   WBC 5.5 04/18/2014   HGB 9.1* 04/18/2014   HCT 28.8* 04/18/2014   PLT 229 04/18/2014   GLUCOSE 83 04/17/2014   ALT 34 04/15/2014   AST 36 04/15/2014   NA 137 04/17/2014   K 3.7 04/17/2014   CL 107 04/17/2014   CREATININE 0.83 04/17/2014   BUN 13 04/17/2014   CO2 20 04/17/2014   TSH 4.683* 03/12/2014   INR 1.27 04/15/2014   HGBA1C 5.6 03/21/2014      Assessment / Plan:  Overall the patient is doing well. His  primary symptoms are related to fatigue. This may be related to his blood pressure. I  told him to discontinue also taking his Zestril. I told him to discuss the depression with his primary physician Dr. badger at his next office appointment which is Wednesday of this week. We will see him again in office in 2 weeks with a repeat chest x-ray. I encouraged him to increase his ambulation as well as to continue his breathing exercises.          Keishawna Carranza E 04/30/2014 1:39 PM

## 2014-05-02 ENCOUNTER — Telehealth: Payer: Self-pay | Admitting: Cardiology

## 2014-05-02 NOTE — Telephone Encounter (Signed)
Returned call to patient's wife Zigmund Daniel she stated husband needs appointment with [place].Stated he saw Dr.Gerhardt's PA this week and he advised to see Dr.Harding as soon as possible.Stated his B/P has been low and he is very weak.Appointment offered with Dr.Harding tomorrow 05/03/13.Stated they have another appointment tomorrow want be able to come.Appointment scheduled with Ignacia Bayley NP Friday 05/04/14 at 11:30 am at our [facility] office.

## 2014-05-02 NOTE — Telephone Encounter (Signed)
Robert Boyle called in stating that the pt should be seen as soon as possible because he has been in and out of the hospital for blood clots. And she would for him to be seen by Dr. Ellyn Hack this week. Please follow up  * Dr. Ellyn Hack does not have anything but "same day" slots available , can the pt be put in one of those. This lady has called several time.   Thanks

## 2014-05-03 ENCOUNTER — Ambulatory Visit: Payer: Medicare HMO | Admitting: Cardiothoracic Surgery

## 2014-05-04 ENCOUNTER — Ambulatory Visit: Payer: Commercial Managed Care - HMO | Admitting: Nurse Practitioner

## 2014-05-04 ENCOUNTER — Ambulatory Visit (INDEPENDENT_AMBULATORY_CARE_PROVIDER_SITE_OTHER): Payer: Medicare HMO | Admitting: Cardiology

## 2014-05-04 VITALS — BP 100/60 | Ht 73.0 in | Wt 198.8 lb

## 2014-05-04 DIAGNOSIS — R5383 Other fatigue: Secondary | ICD-10-CM

## 2014-05-04 DIAGNOSIS — Z951 Presence of aortocoronary bypass graft: Secondary | ICD-10-CM

## 2014-05-04 DIAGNOSIS — I2699 Other pulmonary embolism without acute cor pulmonale: Secondary | ICD-10-CM

## 2014-05-04 DIAGNOSIS — I208 Other forms of angina pectoris: Secondary | ICD-10-CM

## 2014-05-04 DIAGNOSIS — Z9861 Coronary angioplasty status: Secondary | ICD-10-CM

## 2014-05-04 DIAGNOSIS — I25119 Atherosclerotic heart disease of native coronary artery with unspecified angina pectoris: Secondary | ICD-10-CM

## 2014-05-04 DIAGNOSIS — I10 Essential (primary) hypertension: Secondary | ICD-10-CM

## 2014-05-04 DIAGNOSIS — I2510 Atherosclerotic heart disease of native coronary artery without angina pectoris: Secondary | ICD-10-CM

## 2014-05-04 DIAGNOSIS — I209 Angina pectoris, unspecified: Secondary | ICD-10-CM

## 2014-05-04 DIAGNOSIS — E785 Hyperlipidemia, unspecified: Secondary | ICD-10-CM

## 2014-05-04 MED ORDER — METOPROLOL TARTRATE 50 MG PO TABS: 25.0000 mg | ORAL_TABLET | Freq: Two times a day (BID) | ORAL | Status: DC

## 2014-05-04 MED ORDER — METOPROLOL TARTRATE 25 MG PO TABS: 12.5000 mg | ORAL_TABLET | Freq: Two times a day (BID) | ORAL | Status: DC

## 2014-05-04 NOTE — Patient Instructions (Signed)
Your physician has recommended you make the following change in your medication: decrease the atorvastatin to 40 mg daily. ( 1/2 of the 80 mg)  STOP the amlodipine. DECREASE THE LOPRESSOR TO 12.5 MG TWICE DAILY. A new prescription has been sent to your pharmacy.  Your physician recommends that you schedule a follow-up appointment in: 6 weeks with Dr. Ellyn Hack.

## 2014-05-06 ENCOUNTER — Encounter: Payer: Self-pay | Admitting: Cardiology

## 2014-05-06 DIAGNOSIS — R5383 Other fatigue: Secondary | ICD-10-CM | POA: Insufficient documentation

## 2014-05-06 NOTE — Assessment & Plan Note (Signed)
His wound to be healing well. He is the doubly fatigued and worn out since his initial post op course from home was calculated by pulmonary embolus. Plan: Continue scheduled followup with Dr. Servando Snare to follow sternal healing.  We'll refer to cardiac rehabilitation Phase 2

## 2014-05-06 NOTE — Assessment & Plan Note (Signed)
Precath, his blood pressure was quite elevated. Now he is hypotensive. ACE inhibitor was stopped in the hospital secondary to hypotension and acute renal insufficiency. I stop Norvasc and metoprolol down to 25 mg twice a day. Would probably go back to Toprol 25 mg once is more stable. Imdur was also stopped in the hospital, and is probably not necessary now.  I would not doubt that in the next month or so his blood pressure will likely start it back up again. May see him back in 6 weeks in order to see if we need to restart either Norvasc or lisinopril.

## 2014-05-06 NOTE — Assessment & Plan Note (Signed)
Reducing atorvastatin from 80-40 mg - thought that perhaps elevated statin dose is not helping his fatigue

## 2014-05-06 NOTE — Assessment & Plan Note (Signed)
No further anginal symptoms.

## 2014-05-06 NOTE — Progress Notes (Signed)
PCP: Chesley Noon, MD  Clinic Note: Chief Complaint  Patient presents with  . Hospitalization Follow-up    patient is extremely tired.; s/p CABG --> Large bilateral PE   HPI/ Interval History: Robert Boyle is a 79 y.o. the gentleman is a long-standing patient and good friend of Dr. Rockne Menghini,  Sulfa the first time on November 24 for urgent evaluation upper found progressively worsening exertional dyspnea walking up steps and even eating a meal.  Dr. Rex Kras insisted that he be seen urgently. He actually initially came in for a Myoview stress test that was read as INTERMEDIATE RISK with new ischemia in the inferior and inferolateral region.  He was contacted to return for clinic evaluation.  He saw me as a postop Croitoru simply because I was the doctor of the day.    He has a History of Coronary Artery Diseasestatus post BMS PCI to D1 in 2004 abnormal stress test. One year later he had PTCA for in-stent restenosis. He had been doing well ever since until last October and November.  Based on his history of CAD and significant risk stress test, I performed an urgent cardiac catheterization on him at the next possible as I was concerned that his symptoms are quite consistent with class 3-4 angina. I added Imdur and metoprolol. He was cathed on December 1.  He was found to have significant LAD disease with an occluded RCA filled via left to right collaterals. Basal location of disease in the LAD with an occluded RCA, I felt he would be best served with CABG.he was actually taken to the OR the following day (December 2) by Dr. Servando Snare for CABG x5: LIMA-LAD, seqSVG-D1-D2, seqSVG-PDA-PLB.  He was discharged on December 8, after relatively uneventful post hospital course. He was started on an ACE inhibitoran increased dose of beta blocker for tachycardia.    Once he went home, he, as expected was feeling relatively fatigued and tired. He was doing physical therapy twice a week. He is walking 3  times a day as directed.However on the night of Christmas day, he became progressively dyspneic.  3 days before that he had noted some severe pain in his hip and lower back he then started noticing a worsening dyspnea. Dr. Rex Kras actually saw him socially, and was quite concerned with decreased breath sounds on exam that he potentially could have a large pleural effusion. He was actually sent directly to the emergency room where he was admitted for what actually was found to be an acute pulmonary embolus --:noted to be within both main pulmonary arteries, with suggestion of increased RV strain.. Focal right basilar opacification was noted consistent with pulmonary infarct.  Mild dilation of the ascending aorta 2.8 cm noted.   He became hypotensive his antihypertensive agents were held. He responded to IV fluids, but did have signs of prerenal azotemia and his lisinopril was held. He was started on Xarelto at the twice a day loading dose for VTE treatment.  Dr. Rex Kras decreased his beta blocker to 25mg  twice a day due to fatigue comment up in Norvasc dose in half. He was seen by the PA in Dr. Guinevere Ferrari office and was told to stop Zestril. He was also recommended to discuss his SSRI with his PCP.  He presents now for his first post hospital followup. He has been extremely fatigued, dizzy and weak. He has no" get up and go do anything. He is sort of stress but denies any anginal pain or any significant  dyspnea at consistent with either his pre-CABG or initial PE symptom. Does note exertional dyspnea with moderate exertion, but he hasnot had any chest tightness or pressure with rest or exertion. He is however expected the dyspneic on exertion. He has not had any significant rapid or irregular heartbeat/palpitations. Since his PE diagnosis he is not had sensations of near syncope or syncope, TIA or amaurosis fugax symptoms. He has not had any bleeding complications from the Xarelto with melena, hematochezia, hematuria  or epistaxis. His edema has essentially resolved. He is not yet regain a full appetite, but is starting to eat more. His urologist started him on Flomax    Past Medical History  Diagnosis Date  . CAD S/P percutaneous coronary angioplasty 2000, 2001, 03/2014    a) 2000: BMS to D1 75% stenosis (~2.5 mm x 18 mm AVE BMS); b) 2001 ISR of D1 stent - PTCA w/ 2.5 mm Balloon;; c) Myoview 11/'15: Intermediate Risk, inferior-inferolateral ischemia --> Cath: 40% proximal LAD with 90% mid; 90% mid D1 and D2; 100% proximal RCA with left-to-right collaterals for both PL and PDA --> CABG    . 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 Acute pulmonary embolus 04/13/14    CTA: Bilateral Main PAs; suggestion of increased RV strain & R basal pulmonary infarct,  Mild dilation of the ascending aorta 2.8 cm noted. --> On Xarelto  . Essential hypertension   . Hyperlipidemia with target LDL less than 70   . Prostate cancer 05/23/13    gleason 4+3=7, vloume 34.32 cc; s/p Chemo-XRT  . History of radiation therapy 09/06/13- 11/02/13    prostate 7600 cGy 40 sessions, seminal vesicles 5600 cGy 40 sessions  . Arthritis   . GERD (gastroesophageal reflux disease)   . H/O renal calculi   . Shortness of breath dyspnea   . Gastrointestinal complaints   . Gout   . Arthritis    ROS: A comprehensive was performed. Review of Systems  Constitutional: Positive for weight loss (As expected, postop weight loss of 13 pounds) and malaise/fatigue. Negative for fever and chills.  Eyes: Negative for blurred vision.  Gastrointestinal: Negative for blood in stool and melena.  Genitourinary: Negative for hematuria.  Musculoskeletal: Negative for myalgias.  Neurological: Positive for dizziness and weakness.  Psychiatric/Behavioral: Positive for depression (At baseline, but now it has been more notable with his "coming to grips with his mortality twice in the same month ").   Current outpatient prescriptions:    .  ALPRAZolam (XANAX) 0.5 MG tablet, Take 0.5 mg by mouth at bedtime., Disp: , Rfl:  .  tamsulosin (FLOMAX) 0.4 MG CAPS capsule, Take 0.4 mg by mouth at bedtime., Disp: , Rfl:  .  allopurinol (ZYLOPRIM) 300 MG tablet, Take 300 mg by mouth daily., Disp: , Rfl:  .  aspirin EC 81 MG EC tablet, Take 1 tablet (81 mg total) by mouth daily., Disp: , Rfl:  .  atorvastatin (LIPITOR) 80 MG tablet, Take 40 mg by mouth daily., Disp: , Rfl:  .  finasteride (PROSCAR) 5 MG tablet, Take 5 mg by mouth daily., Disp: , Rfl:  .  furosemide (LASIX) 20 MG tablet, Take 20 mg by mouth daily., Disp: , Rfl:  .  metoprolol tartrate (LOPRESSOR) 25 MG tablet, Take1 tablets (25 mg total) by mouth 2 (two) times daily., Disp: 60 tablet, Rfl: 6 .  Multiple Vitamin (MULTIVITAMIN WITH MINERALS) TABS tablet, Take 1 tablet by mouth daily., Disp: , Rfl:  .  omeprazole (PRILOSEC) 20 MG capsule, Take 40 mg by mouth daily. , Disp: , Rfl:  .  Rivaroxaban (XARELTO STARTER PACK) 15 & 20 MG TBPK, Take as directed on package: Start with one 15mg  tablet by mouth twice a day with food. On Day 22, switch to one 20mg  tablet once a day with food., Disp: 51 each, Rfl: 0 .  sertraline (ZOLOFT) 50 MG tablet, , Disp: , Rfl: 0   Amlodipine 5 mg  Flomax 0.4 mg  ALLERGIES REVIEWED IN EPIC -- No change  History   Social History Narrative   Retired.    Divorced, but lives with his ex-wife.   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 No tobacco products.   Plays golf with his friends and sons.   Family History  Problem Relation Age of Onset  . Heart attack Father   . Stroke Mother   . Heart attack Brother   . Cancer Brother     one brother-prostate    Wt Readings from Last 3 Encounters:  05/04/14 198 lb 12.8 oz (90.175 kg)  04/30/14 196 lb (88.905 kg)  04/18/14 197 lb 5 oz (89.5 kg)    PHYSICAL EXAM BP 100/60 mmHg  Ht 6\' 1"  (1.854 m)  Wt 198 lb 12.8 oz (90.175 kg)  BMI 26.23 kg/m2 General appearance: alert, cooperative, appears stated  age; although not critically ill-appearing, he does seem quite less energetic than usual --> No acute distress Neck: no adenopathy, no carotid bruit and no JVD Lungs: mildly decreased bibasilar breath sounds, R>L, but no rales or rhonchi. Normal effort Heart:RRR, S1&S2 normal (potentially physiologic S2 split), no murmur, click, rub or gallop; nondisplaced PMI Abdomen: soft, non-tender; bowel sounds normal; no masses,  no organomegaly; mild truncal obesity Extremities: extremities normal, atraumatic, no cyanosis,or edema Pulses: 2+ and symmetric; Skin: mobility and turgor normal Neurologic: Mental status: Alert, oriented, thought content appropriate Cranial nerves: normal (II-XII grossly intact)  Adult ECG Report -yes  NSR, 85 bpm, nonspecific ST and T wave changes. Otherwise normal EKG  Recent Labs:   Hospital labs reviewed  ASSESSMENT / PLAN:  Very difficult clinic visit in 02 review his clinical course from last clinic visit until now. He is a heart catheterization, CABG and then a pulmonary embolus followed by profound hypotension and fatigue. Medical issues requiring extensive medical decision-making. Also chart review was extensive.  - Well over one hour spent between in clinic time with the patient of close to 40 minutes possible additional 30 minutes of chart review and dictation.  Atherosclerotic heart disease native coronary artery w/angina pectoris -- Sent for CABG He presented in November with signs and significantly worsening exertional dyspnea class 3-4 angina. Multivessel disease noted with occluded RCA and significant LAD disease. Referred for CABG the following day.  No further anginal symptoms. --> on statin and beta blocker. Beta blocker dose was already reduced due to fatigue and hypotension.   Angina, class IV No further anginal symptoms.   S/P CABG x 5: LIMA-LAD, SVG-D1-D2, SVG-RPDA-RPL His wound to be healing well. He is the doubly fatigued and worn out since  his initial post op course from home was calculated by pulmonary embolus. Plan: Continue scheduled followup with Dr. Servando Snare to follow sternal healing.  We'll refer to cardiac rehabilitation Phase 2   Pulmonary embolism, bilateral, with pulmonary infarction Colitis carried circumstance. Significant PE burden, with evidence of RV strain. He is probably preload dependent still, and therefore does not tolerate aggressive antihypertensive regimen.  He should be  far enough out from this event to be able to start cardiac rehabilitation.  Plan: Xarelto for minimum of 6 months, but due to the extensive PE, would probably continue for closer to a year.   Fatigue due to treatment I agree with having reduced his Norvasc.  I will simply stop it for now until his blood pressure starts to respond following his 2 hospitalizations.  I will further cut beta blocker dose from 25 down to 12.5 mg twice a day and reduce atorvastatin 40 mg.   Essential hypertension Precath, his blood pressure was quite elevated. Now he is hypotensive. ACE inhibitor was stopped in the hospital secondary to hypotension and acute renal insufficiency. I stop Norvasc and metoprolol down to 25 mg twice a day. Would probably go back to Toprol 25 mg once is more stable. Imdur was also stopped in the hospital, and is probably not necessary now.  I would not doubt that in the next month or so his blood pressure will likely start it back up again. May see him back in 6 weeks in order to see if we need to restart either Norvasc or lisinopril.   Hyperlipidemia with target LDL less than 70 Reducing atorvastatin from 80-40 mg - thought that perhaps elevated statin dose is not helping his fatigue    Orders Placed This Encounter  Procedures  . EKG 12-Lead    Followup: 6 weeks  Catalina Salasar, [name], M.D., M.S. Interventional Cardiologist   Pager # [phone]

## 2014-05-06 NOTE — Assessment & Plan Note (Signed)
I agree with having reduced his Norvasc.  I will simply stop it for now until his blood pressure starts to respond following his 2 hospitalizations.  I will further cut beta blocker dose from 25 down to 12.5 mg twice a day and reduce atorvastatin 40 mg.

## 2014-05-06 NOTE — Assessment & Plan Note (Addendum)
He presented in November with signs and significantly worsening exertional dyspnea class 3-4 angina. Multivessel disease noted with occluded RCA and significant LAD disease. Referred for CABG the following day.  No further anginal symptoms. --> on statin and beta blocker. Beta blocker dose was already reduced due to fatigue and hypotension.

## 2014-05-06 NOTE — Assessment & Plan Note (Signed)
Colitis carried circumstance. Significant PE burden, with evidence of RV strain. He is probably preload dependent still, and therefore does not tolerate aggressive antihypertensive regimen.  He should be far enough out from this event to be able to start cardiac rehabilitation.  Plan: Xarelto for minimum of 6 months, but due to the extensive PE, would probably continue for closer to a year.

## 2014-05-08 ENCOUNTER — Other Ambulatory Visit: Payer: Self-pay | Admitting: *Deleted

## 2014-05-08 ENCOUNTER — Telehealth: Payer: Self-pay | Admitting: *Deleted

## 2014-05-08 DIAGNOSIS — R5383 Other fatigue: Secondary | ICD-10-CM

## 2014-05-08 DIAGNOSIS — Z951 Presence of aortocoronary bypass graft: Secondary | ICD-10-CM

## 2014-05-08 NOTE — Telephone Encounter (Signed)
ORDER ONLY- FOR CARDIAC REHAB AT [facility] DONE. PER Order from Dr Ellyn Hack.

## 2014-05-15 ENCOUNTER — Other Ambulatory Visit: Payer: Self-pay | Admitting: Cardiothoracic Surgery

## 2014-05-15 ENCOUNTER — Telehealth: Payer: Self-pay | Admitting: Cardiology

## 2014-05-15 DIAGNOSIS — I2699 Other pulmonary embolism without acute cor pulmonale: Secondary | ICD-10-CM

## 2014-05-15 MED ORDER — RIVAROXABAN 20 MG PO TABS: 20.0000 mg | ORAL_TABLET | Freq: Every day | ORAL | Status: DC

## 2014-05-15 NOTE — Telephone Encounter (Signed)
I can refill.  Robert Boyle

## 2014-05-15 NOTE — Telephone Encounter (Signed)
Robert Boyle called in wanted to know if Dr. Ellyn Hack would be managing the pt's Xarelto medication from this point on and if so, he will be needing a refill on this soon. Robert Boyle says to feel free to call her if there are any questions.  Thanks

## 2014-05-15 NOTE — Telephone Encounter (Signed)
Re: Xarelto.   Pt was on starter pack beginning 04/18/14, wrote for 20mg  daily. Called [facility] RN to inform..Left message on machine

## 2014-05-17 ENCOUNTER — Encounter (HOSPITAL_COMMUNITY)
Admission: RE | Admit: 2014-05-17 | Discharge: 2014-05-17 | Disposition: A | Discharge status: Home / Self Care | Payer: Medicare HMO | Source: Ambulatory Visit | Attending: Cardiology | Admitting: Cardiology

## 2014-05-17 ENCOUNTER — Encounter: Payer: Self-pay | Admitting: Cardiothoracic Surgery

## 2014-05-17 ENCOUNTER — Ambulatory Visit (INDEPENDENT_AMBULATORY_CARE_PROVIDER_SITE_OTHER): Payer: Self-pay | Admitting: Cardiothoracic Surgery

## 2014-05-17 ENCOUNTER — Ambulatory Visit
Admission: RE | Admit: 2014-05-17 | Discharge: 2014-05-17 | Disposition: A | Discharge status: Home / Self Care | Payer: Medicare HMO | Source: Ambulatory Visit | Attending: Cardiothoracic Surgery | Admitting: Cardiothoracic Surgery

## 2014-05-17 VITALS — BP 112/64 | HR 100 | Resp 20 | Ht 71.0 in | Wt 200.0 lb

## 2014-05-17 DIAGNOSIS — I2699 Other pulmonary embolism without acute cor pulmonale: Secondary | ICD-10-CM

## 2014-05-17 DIAGNOSIS — Z951 Presence of aortocoronary bypass graft: Secondary | ICD-10-CM

## 2014-05-17 NOTE — Progress Notes (Signed)
Cardiac Rehab Medication Review by a Pharmacist  Does the patient  feel that his/her medications are working for him/her?  yes  Has the patient been experiencing any side effects to the medications prescribed?  Yes  Does the patient measure his/her own blood pressure or blood glucose at home?  yes   Does the patient have any problems obtaining medications due to transportation or finances?   no  Understanding of regimen: good Understanding of indications: good Potential of compliance: excellent    Pharmacist comments: Robert Boyle is a 79 yo M presenting to cardiac rehab orienetation in good spirits. Pt explained his blood pressure has been variable and he isnt sure why. Due to this, he has been very tired lately. This could be due to the combination of his flomax and proscar. His appetite has been decreased since surgery but it is starting to come back. He takes his BP at home, usually three times a week. Patient has a good understanding of the medications he was taking and why he is taking them. All questions were answered during our time.   Thank you for allowing pharmacy to be part of this patient's care team  Dorothe Elmore M. Alessander Sikorski, Pharm.D Clinical Pharmacy Resident Pager: [phone] 05/17/2014 .8:42 AM

## 2014-05-17 NOTE — Progress Notes (Signed)
[address]       [place] [address]             [id number]                  Robert Boyle Record #[id number] Date of Birth: [birth year]  Referring HU:[id number], [name], MD Primary Cardiology: Primary Care:Boyle,Robert C, MD  Chief Complaint:  Follow Up Visit  OPERATIVE REPORT   PREOPERATIVE DIAGNOSIS: New-onset angina with three-vessel coronary artery disease and positive stress test.  POSTOPERATIVE DIAGNOSIS: New-onset angina with three-vessel coronary artery disease and positive stress test.  SURGICAL PROCEDURE: Coronary artery bypass grafting x5 with the left internal mammary to the left anterior descending coronary artery, sequential reverse saphenous vein graft to the first and second diagonal coronary artery, sequential reverse saphenous vein graft to the posterior descending and posterolateral branches of the right coronary artery with right thigh and calf endo vein harvesting.  SURGEON: Robert Bal, MD  History of Present Illness:      The patient is seen in routine office follow-up following his above procedure. It is noted that he did have a recent hospitalization where he was found to have pulmonary embolisms and is now on xarelto. The patient primarily complains of significant fatigue. He denies fevers, chills or other constitutional symptoms. He has been somewhat depressed and has been started on Zoloft by his primary care physician 04/24/2014. Additionally a home nurses noted that he has been somewhat hypotensive and medications were adjusted by his neighbor Dr. Aldona Boyle. His beta blocker was decreased as well as his Norvasc. Systolic blood pressure in the office today is 95. He denies significant pain. He is not bringing up any sputum. He has no significant cough.      Zubrod Score: At the time of surgery this patient's most appropriate activity status/level should be  described as: []     0    Normal activity, no symptoms []     1    Restricted in physical strenuous activity but ambulatory, able to do out light work []     2    Ambulatory and capable of self care, unable to do work activities, up and about                 >50 % of waking hours                                                                                   []     3    Only limited self care, in bed greater than 50% of waking hours []     4    Completely disabled, no self care, confined to bed or chair []     5    Moribund  History  Smoking status  . Former Smoker -- 2.00 packs/day for 25 years  . Quit date: 04/20/1983  Smokeless tobacco  . Never Used       No Known Allergies  Current Outpatient Prescriptions  Medication Sig Dispense Refill  . allopurinol (ZYLOPRIM) 300 MG tablet Take 300 mg by mouth daily.    Marland Kitchen ALPRAZolam (XANAX) 0.5 MG  tablet Take 0.5 mg by mouth at bedtime.    Marland Kitchen aspirin EC 81 MG EC tablet Take 1 tablet (81 mg total) by mouth daily.    Marland Kitchen atorvastatin (LIPITOR) 80 MG tablet Take 40 mg by mouth daily.    . finasteride (PROSCAR) 5 MG tablet Take 5 mg by mouth daily.    . metoprolol tartrate (LOPRESSOR) 25 MG tablet Take 0.5 tablets (12.5 mg total) by mouth 2 (two) times daily. 30 tablet 6  . Multiple Vitamin (MULTIVITAMIN WITH MINERALS) TABS tablet Take 1 tablet by mouth daily.    Marland Kitchen omeprazole (PRILOSEC) 20 MG capsule Take 40 mg by mouth daily.     . rivaroxaban (XARELTO) 20 MG TABS tablet Take 1 tablet (20 mg total) by mouth daily with supper. 30 tablet 5  . sertraline (ZOLOFT) 50 MG tablet Take 50 mg by mouth daily.   0  . tamsulosin (FLOMAX) 0.4 MG CAPS capsule Take 0.4 mg by mouth at bedtime.     No current facility-administered medications for this visit.       Physical Exam: BP 112/64 mmHg  Pulse 100  Resp 20  Ht 5\' 11"  (1.803 m)  Wt 200 lb (90.719 kg)  BMI 27.91 kg/m2  SpO2 95%  General appearance: alert, cooperative and no distress Heart: regular  rate and rhythm Lungs: dim in right base Abdomen: Benign exam Extremities: extremities normal, atraumatic, no cyanosis or edema Wound: Incisions healing well without evidence of infection. Wounds:  Diagnostic Studies & Laboratory data:         Recent Radiology Findings: Dg Chest 2 View  05/17/2014   CLINICAL DATA:  Pulmonary embolus.  EXAM: CHEST  2 VIEW  COMPARISON:  04/30/2014.  FINDINGS: Mediastinum and hilar structures are normal. Prior CABG. Stable cardiomegaly. Stable elevation right hemidiaphragm. Interposition of bowel under hemidiaphragms again noted. No acute infiltrate. No pleural effusion or pneumothorax. Biapical pleural thickening noted consistent with scarring. Degenerative change thoracic spine  IMPRESSION: 1. Stable elevation right hemidiaphragm. 2. Prior CABG.  Heart size normal.   Electronically Signed   By: Robert Boyle   On: 05/17/2014 12:12      Recent Labs: Lab Results  Component Value Date   WBC 5.5 04/18/2014   HGB 9.1* 04/18/2014   HCT 28.8* 04/18/2014   PLT 229 04/18/2014   GLUCOSE 83 04/17/2014   ALT 34 04/15/2014   AST 36 04/15/2014   NA 137 04/17/2014   K 3.7 04/17/2014   CL 107 04/17/2014   CREATININE 0.83 04/17/2014   BUN 13 04/17/2014   CO2 20 04/17/2014   TSH 4.683* 03/12/2014   INR 1.27 04/15/2014   HGBA1C 5.6 03/21/2014      Assessment / Plan:  Overall the patient is doing well. His primary symptoms are related to fatigue.  Continues on anticoagulation for pulmonary emobulos To start cardiac rehab return 6 weeks  Robert Boyle,Robert Boyle 05/17/2014 12:33 PM

## 2014-05-21 ENCOUNTER — Telehealth (HOSPITAL_COMMUNITY): Payer: Self-pay | Admitting: *Deleted

## 2014-05-21 ENCOUNTER — Encounter (HOSPITAL_COMMUNITY)
Admission: RE | Admit: 2014-05-21 | Discharge: 2014-05-21 | Disposition: A | Discharge status: Home / Self Care | Payer: Medicare HMO | Source: Ambulatory Visit | Attending: Cardiology | Admitting: Cardiology

## 2014-05-21 ENCOUNTER — Encounter (HOSPITAL_COMMUNITY): Payer: Medicare HMO

## 2014-05-21 DIAGNOSIS — E785 Hyperlipidemia, unspecified: Secondary | ICD-10-CM | POA: Diagnosis not present

## 2014-05-21 DIAGNOSIS — I10 Essential (primary) hypertension: Secondary | ICD-10-CM | POA: Insufficient documentation

## 2014-05-21 DIAGNOSIS — I2510 Atherosclerotic heart disease of native coronary artery without angina pectoris: Secondary | ICD-10-CM | POA: Insufficient documentation

## 2014-05-21 DIAGNOSIS — Z9861 Coronary angioplasty status: Secondary | ICD-10-CM | POA: Insufficient documentation

## 2014-05-21 DIAGNOSIS — Z951 Presence of aortocoronary bypass graft: Secondary | ICD-10-CM | POA: Insufficient documentation

## 2014-05-21 NOTE — Telephone Encounter (Signed)
Message left regarding Dr. Ellyn Hack recommendations that he stop Metoprolol.  Contact phone information provided. Cherre Huger, BSN

## 2014-05-21 NOTE — Progress Notes (Signed)
Pt in today for his first day of exercise.  Pt tolerated light exercise with some complaint of weakness and tired.  Pt unable to ambulate on the track and therefore was moved to the track.Pt exit bp 88/56 Pt given h20 increased to 93/60. Pt denies any symptoms but does remark that on occasion he will have some dizziness upon standing. Therefore orthostatic bp were checked. Sitting 101/56, standing 75/69 left arm 87/48 right arm remained asymptomatic.  Gatorade given - pt stated he had not eaten any breakfast and only had a cup of regular coffee for breakfast. Pt given banana as well. Rechecked orthostatic bp 103/53 standing 78/53. Waited for 5 additional minutes standing bp 92/55. Pt remained asymp - deemed ok to go home with wife who accompanied him and is the driver. Medications include metoprolol 12.5mg  twice a day, flomax .4mg  daily at night. Wife wonders if he is taking too much bp medications. .  Pt verbalized compliance to his medications and voiced no barriers regarding his medications.  Pt did switch his flomax to night time and reported some nocturnal voiding x 2.    Psychosocial Assessment  PHQ2 score  Pt recently started on zoloft and reports no change in his depression.  Asked about counseling.  Pt declined offer.  Pt would like to give the medication a couple more weeks to see if this will help.  Pt verbalizes he has supportive family but they are concerned about his lack of desire for those activities he found pleasurable.  Spoke with his wife who also noted a change when he retired from working a high stressed job.  His retirement was his idea and there is no animosity regarding retirement.  Pt prefers to sit at home and watch TV which is bothersome to his wife.  She is hopeful that coming to the exercise program will improve him physically as well as mentally.  Pt remains hopeful for his future.  He participated in CR for previous CR event and understands the benfit of  Exercise. Pt goals  are related to increase stamina and energy.  Pt desires to achieve his pre surgery or even better energy levels.  Pt does not do any exercise/walking since his surgery.  Pt has not had the energy to walk consistently.  He did walk in the home over the weekend but has not advanced his activity.  Home exercise with periodic check in for accountability.  Will pull in family for the home exercise discussion by the EP.  Will continue to monitor his progress toward meeting this goal. Will fax over and in basket Dr. Ellyn Hack about his first day vital signs. Cherre Huger, BSN

## 2014-05-23 ENCOUNTER — Encounter (HOSPITAL_COMMUNITY): Payer: Medicare HMO

## 2014-05-23 ENCOUNTER — Encounter (HOSPITAL_COMMUNITY)
Admission: RE | Admit: 2014-05-23 | Discharge: 2014-05-23 | Disposition: A | Discharge status: Home / Self Care | Payer: Medicare HMO | Source: Ambulatory Visit | Attending: Cardiology | Admitting: Cardiology

## 2014-05-23 DIAGNOSIS — I2510 Atherosclerotic heart disease of native coronary artery without angina pectoris: Secondary | ICD-10-CM | POA: Diagnosis not present

## 2014-05-23 NOTE — Progress Notes (Signed)
Addendum to note from 2/1.  PHq2 score 6. Pt returned to exercise today for his second day.  Pt received message to stop Metoprolol on Tuesday morning.  Pt with sinus tachycardiac noted 110-120's at rest.  Hr decreased to 104 and pt able to proceed with exercise.  Pt HR during exercise remained in the 120-130's.  Difficult to determine  p wave with higher heart rate this today.  Will send strips over to Dr Ellyn Hack for review.  At times with lower HR p wave becomes more visible.  Pt did have a cup of instant coffee this morning that was caffeine. Overall pt did better today exercise wese verses on Monday.  Pt did not exercise on Tuesday at home due to fatigue.  Encourage pt to do some type of activity on his off days even if it is 5- 10 minute increments at a time. Cherre Huger, BSN

## 2014-05-24 ENCOUNTER — Telehealth (HOSPITAL_COMMUNITY): Payer: Self-pay | Admitting: *Deleted

## 2014-05-24 NOTE — Telephone Encounter (Signed)
-----   Message from Leonie Man, MD sent at 05/23/2014  2:29 PM EST ----- Regarding: RE: update on pt's second day Thanks -  I wouldn't stress too much with HR in 100-110 range.  Not much wiggle room.  DH ----- Message -----    From: Rowe Pavy, RN    Sent: 05/23/2014   1:42 PM      To: Leonie Man, MD Subject: update on pt's second day                      Dr. Ellyn Hack,,  Juluis Rainier - pt returned to exercise today. bp much improved from Monday.  Complains of feeling weak and tired.  Pt did not get my message to stop the Metoprolol until Tuesday morning.  Noted today sinus tachycardia upper 110's that decreased to 105 with rest and feet elevation in order to proceed with exercise.  My guess is rebound tachycardia due to metoprolol stopped just over 24 hours ago.  I will send strips to the office for your review. It is difficult to discern his p wave with the tachy rate however when the rate decreased slightly small p wave noted.  Reviewed his last 12 lead p waves barely visible.  Overall a good day, will encourage him to increase his activity on his off days which in turn will help with endurance.  Thanks so much for your help [employer] RN

## 2014-05-25 ENCOUNTER — Encounter (HOSPITAL_COMMUNITY)
Admission: RE | Admit: 2014-05-25 | Discharge: 2014-05-25 | Disposition: A | Discharge status: Home / Self Care | Payer: Medicare HMO | Source: Ambulatory Visit | Attending: Cardiology | Admitting: Cardiology

## 2014-05-25 ENCOUNTER — Encounter (HOSPITAL_COMMUNITY): Payer: Medicare HMO

## 2014-05-25 DIAGNOSIS — I2510 Atherosclerotic heart disease of native coronary artery without angina pectoris: Secondary | ICD-10-CM | POA: Diagnosis not present

## 2014-05-25 NOTE — Progress Notes (Signed)
Patient here for his third day of exercise today at cardiac rehab. Telemetry rhythm Sinus tach 100.  Max heart rate noted at 129 with exercise today.  Mr Robert Boyle said he noticed his heart rate was 147 after walking down the stairs at home. Patient has a pulse oximeter.  Mr Robert Boyle is no longer taking his metoprolol he was told to discontinue it due to low BP. Dr Allison Quarry office called and notified spoke with [facility].  Will fax exercise flow sheets to Dr. Darcus Pester  office for review.Marland Kitchen

## 2014-05-28 ENCOUNTER — Encounter (HOSPITAL_COMMUNITY): Payer: Medicare HMO

## 2014-05-28 ENCOUNTER — Encounter (HOSPITAL_COMMUNITY)
Admission: RE | Admit: 2014-05-28 | Discharge: 2014-05-28 | Disposition: A | Discharge status: Home / Self Care | Payer: Medicare HMO | Source: Ambulatory Visit | Attending: Cardiology | Admitting: Cardiology

## 2014-05-28 DIAGNOSIS — I2510 Atherosclerotic heart disease of native coronary artery without angina pectoris: Secondary | ICD-10-CM | POA: Diagnosis not present

## 2014-05-28 NOTE — Progress Notes (Signed)
Pt returned to exercise today. HR remains elevated at rest and exertion. Will ask Dr. Ellyn Hack for specific HR for this patient. Cherre Huger, BSN

## 2014-05-30 ENCOUNTER — Telehealth (HOSPITAL_COMMUNITY): Payer: Self-pay | Admitting: *Deleted

## 2014-05-30 ENCOUNTER — Encounter (HOSPITAL_COMMUNITY): Payer: Medicare HMO

## 2014-05-30 ENCOUNTER — Encounter (HOSPITAL_COMMUNITY)
Admission: RE | Admit: 2014-05-30 | Discharge: 2014-05-30 | Disposition: A | Discharge status: Home / Self Care | Payer: Medicare HMO | Source: Ambulatory Visit | Attending: Cardiology | Admitting: Cardiology

## 2014-05-30 DIAGNOSIS — I2510 Atherosclerotic heart disease of native coronary artery without angina pectoris: Secondary | ICD-10-CM | POA: Diagnosis not present

## 2014-05-30 NOTE — Telephone Encounter (Signed)
----- Message from Robert Man, MD sent at 05/30/2014  5:06 PM EST ----- Regarding: RE: update on pt's second day 130 bpm is OK  Robert Boyle ----- Message -----    From: Robert Boyle, Robert Boyle    Sent: 05/30/2014   8:12 AM      To: Robert Man, MD Subject: RE: update on pt's second day                  Will use this for resting HR.  What about for exertional HR what is the max HR for exertion?  Thanks Robert Boyle  ----- Message -----    From: Robert Man, MD    Sent: 05/28/2014   7:22 PM      To: Robert Boyle, Robert Boyle Subject: RE: update on pt's second day                  If his HR gets into the 110-120 range & he is asymptomatic - that is OK.  Robert Boyle ----- Message -----    From: Robert Boyle, Robert Boyle    Sent: 05/28/2014   2:20 PM      To: Robert Man, MD Subject: FW: update on pt's second day                  Dr. [name] Pt returned today for exercise and color wise looks much better than last week.  High HR remain an issue for exercise.    Presently I have him sit for about 5-8 upon reporting his pre exercise bp and weight.  Pt stays after class ends for his HR to return to within 10 of his pre exercise HR.  For this pt, what is an acceptable resting and exertional HR?  See below in basket message regarding maximum HR according to his age.  Robert Boyle ----- Message -----    From: Robert Boyle, Robert Boyle    Sent: 05/23/2014   2:40 PM      To: Robert Man, MD Subject: RE: update on pt's second day                  I agree, mainly concerned that his P wave was not discernible at times ( I think it was buried in his twave due to the accelerated rate. Hr 120-130 with very light workloads today).  His max Hr with exercise based upon his age is 31 we use 80% post cardiac event.  However if you agree we can increase his target HR to 130 which is 90% which may not be enough or 144 which is 100% of his max target heart for his age.  Which do you feel is  appropriate?  Thanks for your input, I appreciate your help  Robert Boyle   ----- Message -----    From: Robert Man, MD    Sent: 05/23/2014   2:29 PM      To: Robert Boyle, Robert Boyle Subject: RE: update on pt's second day                  Thanks -  I wouldn't stress too much with HR in 100-110 range.  Not much wiggle room.  Robert Boyle ----- Message -----    From: Robert Boyle, Robert Boyle    Sent: 05/23/2014   1:42 PM      To: Robert Man, MD Subject: update on pt's second day  Dr. [name],,  Robert Boyle - pt returned to exercise today. bp much improved from Monday.  Complains of feeling weak and tired.  Pt did not get my message to stop the Metoprolol until Tuesday morning.  Noted today sinus tachycardia upper 110's that decreased to 105 with rest and feet elevation in order to proceed with exercise.  My guess is rebound tachycardia due to metoprolol stopped just over 24 hours ago.  I will send strips to the office for your review. It is difficult to discern his p wave with the tachy rate however when the rate decreased slightly small p wave noted.  Reviewed his last 12 lead p waves barely visible.  Overall a good day, will encourage him to increase his activity on his off days which in turn will help with endurance.  Thanks so much for your help Robert Boyle

## 2014-06-01 ENCOUNTER — Encounter (HOSPITAL_COMMUNITY): Payer: Medicare HMO

## 2014-06-01 ENCOUNTER — Encounter (HOSPITAL_COMMUNITY)
Admission: RE | Admit: 2014-06-01 | Discharge: 2014-06-01 | Disposition: A | Discharge status: Home / Self Care | Payer: Medicare HMO | Source: Ambulatory Visit | Attending: Cardiology | Admitting: Cardiology

## 2014-06-01 DIAGNOSIS — I2510 Atherosclerotic heart disease of native coronary artery without angina pectoris: Secondary | ICD-10-CM | POA: Diagnosis not present

## 2014-06-01 NOTE — Progress Notes (Signed)
Reviewed home exercise with pt today.  Pt plans to walk at home for exercise.  Reviewed THR, pulse, RPE, sign and symptoms, and when to call 911 or MD.  Pt voiced understanding. Semisi Biela, MA, ACSM RCEP   

## 2014-06-04 ENCOUNTER — Encounter (HOSPITAL_COMMUNITY): Payer: Medicare HMO

## 2014-06-04 ENCOUNTER — Telehealth (HOSPITAL_COMMUNITY): Payer: Self-pay | Admitting: Family Medicine

## 2014-06-06 ENCOUNTER — Encounter (HOSPITAL_COMMUNITY): Payer: Medicare HMO

## 2014-06-06 ENCOUNTER — Encounter (HOSPITAL_COMMUNITY)
Admission: RE | Admit: 2014-06-06 | Discharge: 2014-06-06 | Disposition: A | Discharge status: Home / Self Care | Payer: Medicare HMO | Source: Ambulatory Visit | Attending: Cardiology | Admitting: Cardiology

## 2014-06-06 DIAGNOSIS — I2510 Atherosclerotic heart disease of native coronary artery without angina pectoris: Secondary | ICD-10-CM | POA: Diagnosis not present

## 2014-06-07 ENCOUNTER — Encounter: Payer: Self-pay | Admitting: Cardiology

## 2014-06-08 ENCOUNTER — Encounter (HOSPITAL_COMMUNITY)
Admission: RE | Admit: 2014-06-08 | Discharge: 2014-06-08 | Disposition: A | Discharge status: Home / Self Care | Payer: Medicare HMO | Source: Ambulatory Visit | Attending: Cardiology | Admitting: Cardiology

## 2014-06-08 ENCOUNTER — Encounter (HOSPITAL_COMMUNITY): Payer: Medicare HMO

## 2014-06-08 DIAGNOSIS — I2510 Atherosclerotic heart disease of native coronary artery without angina pectoris: Secondary | ICD-10-CM | POA: Diagnosis not present

## 2014-06-11 ENCOUNTER — Encounter (HOSPITAL_COMMUNITY): Payer: Medicare HMO

## 2014-06-11 ENCOUNTER — Telehealth (HOSPITAL_COMMUNITY): Payer: Self-pay | Admitting: Family Medicine

## 2014-06-13 ENCOUNTER — Encounter (HOSPITAL_COMMUNITY): Payer: Medicare HMO

## 2014-06-13 ENCOUNTER — Encounter (HOSPITAL_COMMUNITY)
Admission: RE | Admit: 2014-06-13 | Discharge: 2014-06-13 | Disposition: A | Discharge status: Home / Self Care | Payer: Medicare HMO | Source: Ambulatory Visit | Attending: Cardiology | Admitting: Cardiology

## 2014-06-13 DIAGNOSIS — I2510 Atherosclerotic heart disease of native coronary artery without angina pectoris: Secondary | ICD-10-CM | POA: Diagnosis not present

## 2014-06-15 ENCOUNTER — Encounter (HOSPITAL_COMMUNITY): Payer: Medicare HMO

## 2014-06-15 ENCOUNTER — Encounter (HOSPITAL_COMMUNITY)
Admission: RE | Admit: 2014-06-15 | Discharge: 2014-06-15 | Disposition: A | Discharge status: Home / Self Care | Payer: Medicare HMO | Source: Ambulatory Visit | Attending: Cardiology | Admitting: Cardiology

## 2014-06-15 DIAGNOSIS — I2510 Atherosclerotic heart disease of native coronary artery without angina pectoris: Secondary | ICD-10-CM | POA: Diagnosis not present

## 2014-06-15 NOTE — Progress Notes (Signed)
Robert Boyle 78 y.o. male Nutrition Note Spoke with pt.  Nutrition Plan and Nutrition Survey goals reviewed with pt. Pt is following Step 1 of the Therapeutic Lifestyle Changes diet. Pt c/o decreased appetite, "but it's improving." Per discussion, pt lost 20 lb with his procedure, "but I've gained 10 back." Pt expressed understanding of the information reviewed. Pt aware of nutrition education classes offered.  Nutrition Diagnosis ? Food-and nutrition-related knowledge deficit related to lack of exposure to information as related to diagnosis of: ? CVD   Nutrition RX/ Estimated Daily Nutrition Needs for: wt maintenance 2300-2600 Kcal, 75-85 gm fat, 15-18 gm sat fat, 2.3-2.6 gm trans-fat, <1500 mg sodium  Nutrition Intervention ? Pt's individual nutrition plan reviewed with pt. ? Benefits of adopting Therapeutic Lifestyle Changes discussed when Medficts reviewed. ? Pt to attend the Portion Distortion class - met; 05/23/14 ? Pt to attend the  ? Nutrition I class                     ? Nutrition II class ?  Continue client-centered nutrition education by RD, as part of interdisciplinary care. Goal(s) ? Pt to identify and limit food sources of saturated fat, trans fat, and cholesterol ? Pt to describe the benefit of including fruits, vegetables, whole grains, and low-fat dairy products in a heart healthy meal plan. Monitor and Evaluate progress toward nutrition goal with team. Nutrition Risk: Change to Moderate [name], M.Ed, RD, LDN, CDE 06/15/2014 12:31 PM  

## 2014-06-18 ENCOUNTER — Encounter (HOSPITAL_COMMUNITY): Payer: Medicare HMO

## 2014-06-18 ENCOUNTER — Encounter (HOSPITAL_COMMUNITY)
Admission: RE | Admit: 2014-06-18 | Discharge: 2014-06-18 | Disposition: A | Discharge status: Home / Self Care | Payer: Medicare HMO | Source: Ambulatory Visit | Attending: Cardiology | Admitting: Cardiology

## 2014-06-18 DIAGNOSIS — I2510 Atherosclerotic heart disease of native coronary artery without angina pectoris: Secondary | ICD-10-CM | POA: Diagnosis not present

## 2014-06-19 ENCOUNTER — Ambulatory Visit (INDEPENDENT_AMBULATORY_CARE_PROVIDER_SITE_OTHER): Payer: Medicare HMO | Admitting: Cardiology

## 2014-06-19 ENCOUNTER — Encounter: Payer: Self-pay | Admitting: Cardiology

## 2014-06-19 VITALS — BP 108/56 | HR 115 | Ht 73.0 in | Wt 197.2 lb

## 2014-06-19 DIAGNOSIS — Z9861 Coronary angioplasty status: Secondary | ICD-10-CM

## 2014-06-19 DIAGNOSIS — I25119 Atherosclerotic heart disease of native coronary artery with unspecified angina pectoris: Secondary | ICD-10-CM

## 2014-06-19 DIAGNOSIS — I2699 Other pulmonary embolism without acute cor pulmonale: Secondary | ICD-10-CM

## 2014-06-19 DIAGNOSIS — E785 Hyperlipidemia, unspecified: Secondary | ICD-10-CM

## 2014-06-19 DIAGNOSIS — I10 Essential (primary) hypertension: Secondary | ICD-10-CM

## 2014-06-19 DIAGNOSIS — Z951 Presence of aortocoronary bypass graft: Secondary | ICD-10-CM

## 2014-06-19 DIAGNOSIS — I2510 Atherosclerotic heart disease of native coronary artery without angina pectoris: Secondary | ICD-10-CM

## 2014-06-19 MED ORDER — METOPROLOL TARTRATE 25 MG PO TABS: 12.5000 mg | ORAL_TABLET | Freq: Two times a day (BID) | ORAL | Status: DC

## 2014-06-19 NOTE — Patient Instructions (Addendum)
  RESTART  Metoprolol  Tart. 12.5 mg -- try taking at night for 3 days if not tired start taking it twice aday   Your physician wants you to follow-up in    3-4   month Dr Ellyn Hack. ---30 min appointment.  You will receive a reminder letter in the mail two months in advance. If you don't receive a letter, please call our office to schedule the follow-up appointment.

## 2014-06-20 ENCOUNTER — Encounter (HOSPITAL_COMMUNITY): Payer: Medicare HMO

## 2014-06-20 ENCOUNTER — Encounter (HOSPITAL_COMMUNITY)
Admission: RE | Admit: 2014-06-20 | Discharge: 2014-06-20 | Disposition: A | Discharge status: Home / Self Care | Payer: Medicare HMO | Source: Ambulatory Visit | Attending: Cardiology | Admitting: Cardiology

## 2014-06-20 DIAGNOSIS — Z951 Presence of aortocoronary bypass graft: Secondary | ICD-10-CM | POA: Insufficient documentation

## 2014-06-20 DIAGNOSIS — E785 Hyperlipidemia, unspecified: Secondary | ICD-10-CM | POA: Diagnosis not present

## 2014-06-20 DIAGNOSIS — I10 Essential (primary) hypertension: Secondary | ICD-10-CM | POA: Insufficient documentation

## 2014-06-20 DIAGNOSIS — I2510 Atherosclerotic heart disease of native coronary artery without angina pectoris: Secondary | ICD-10-CM | POA: Diagnosis present

## 2014-06-20 DIAGNOSIS — Z9861 Coronary angioplasty status: Secondary | ICD-10-CM | POA: Diagnosis not present

## 2014-06-21 ENCOUNTER — Encounter: Payer: Self-pay | Admitting: Cardiology

## 2014-06-21 NOTE — Assessment & Plan Note (Signed)
He did have a significant PE burden with RV strain. RV should be recovering now. He should be on Xarelto for at least 6 months - but probably best for 1 year.. Would recheck echo at around that time to reassess RV function and overall LV function.Marland Kitchen

## 2014-06-21 NOTE — Assessment & Plan Note (Signed)
Cardiac cath showed multivessel CAD in November. Referred for CABG. Seems to be stabilized now and rehabbing quite well after a rocky postoperative course. Is doing cardiac rehabilitation and bearing relatively well.  Heart rate seems to have improved as has his blood pressure. I will restart metoprolol 12.5 mg twice a day. This had been fully stopped because of profound fatigue recently.  We'll start it back on a very gradual basis.

## 2014-06-21 NOTE — Assessment & Plan Note (Signed)
He reduced atorvastatin to 40 mg daily. Should be due for lipid recheck at roughly the time of his follow-up visit.

## 2014-06-21 NOTE — Assessment & Plan Note (Signed)
He had significant hypertension precath /preop. Now was barely able to tolerate low-dose beta blocker. I suspect that over the next several months, we'll gradually restart his medications.

## 2014-06-21 NOTE — Progress Notes (Signed)
PCP: Chesley Noon, MD  Clinic Note: Chief Complaint  Patient presents with  . 6 WEEK VISIT     STARTED REHAB , NO CHEST DISCOMFORT, TIRED,  NO SOB ,  NO EDEMA, DIZZY AT TIMES   HPI: Robert Boyle is a 79 y.o. male with a PMH below who presents today for 2 month follow-up after very complicated post CABG course. He came in for outpatient catheterization for progressively worsening anginal symptoms in late November. He was then taken the next day for multivessel CABG by Dr. Servando Snare. He then went home without much adverse events. Unfortunately he had a setback with bilateral PEs around Christmas time. Because of all this he is been delayed in his recovery and has been recuperating Leamon Arnt more slowly. He has been hypotensive and bradycardic we have weaned off most of his medications and gradually increasing them back..  Past Medical History  Diagnosis Date  . CAD S/P percutaneous coronary angioplasty 2000, 2001, 03/2014    a) 2000: BMS to D1 75% stenosis (~2.5 mm x 18 mm AVE BMS); b) 2001 ISR of D1 stent - PTCA w/ 2.5 mm Balloon;; c) Myoview 11/'15: Intermediate Risk, inferior-inferolateral ischemia --> Cath: 40% proximal LAD with 90% mid; 90% mid D1 and D2; 100% proximal RCA with left-to-right collaterals for both PL and PDA --> CABG    . 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 Acute pulmonary embolus 04/13/14    CTA: Bilateral Main PAs; suggestion of increased RV strain & R basal pulmonary infarct,  Mild dilation of the ascending aorta 2.8 cm noted. --> On Xarelto  . Essential hypertension   . Hyperlipidemia with target LDL less than 70   . Prostate cancer 05/23/13    gleason 4+3=7, vloume 34.32 cc; s/p Chemo-XRT  . History of radiation therapy 09/06/13- 11/02/13    prostate 7600 cGy 40 sessions, seminal vesicles 5600 cGy 40 sessions  . Arthritis   . GERD (gastroesophageal reflux disease)   . H/O renal calculi   . Shortness of breath dyspnea   .  Gastrointestinal complaints   . Gout   . Arthritis     Prior Cardiac Evaluation and Past Surgical History: Past Surgical History  Procedure Laterality Date  . Coronary angioplasty with stent placement  10/17/1998    PCI with ~2.5 mm x 18 mm AVE BMS to D1  . Coronary angioplasty  07/21/1999    PTCA to ISR stenosis to LAD  . Nm myocar perf wall motion  11/06/2011    low risk 63% ; FIXED INFEROSEPTAL GUT ATTENUATION  . Prostate biopsy  05/23/13    gleason 4+3=7, vol 34.32 cc  . Cholecystectomy  01/22/10  . Back surgery  1995    lumbar  . Coronary artery bypass graft N/A 03/21/2014    Procedure: CORONARY ARTERY BYPASS GRAFTING (CABG) TIMES FIVE USING LEFT INTERNAL MAMMARY TO LAD, SAPHENOUS VEIN GRAFT TO THE PD/PL, SAPHENOUS VEIN GRAFT TO DIAGONAL 1 AND 2 SEQUENTIALLY, SAPHENOUS VEIN HARVESTED ENDOSCOPICALLY;  Surgeon: Grace Isaac, MD;  Location: [place];  Service: Open Heart Surgery;  Laterality: N/A;  . Tee without cardioversion N/A 03/21/2014    Procedure: TRANSESOPHAGEAL ECHOCARDIOGRAM (TEE);  Surgeon: Grace Isaac, MD;  Location: [place];  Service: Open Heart Surgery;  Laterality: N/A;  . Left heart catheterization with coronary angiogram N/A 03/20/2014    Procedure: LEFT HEART CATHETERIZATION WITH CORONARY ANGIOGRAM;  Surgeon: Leonie Man, MD;  Location[facility];  Service: Cardiovascular;  Laterality: N/A;    Interval History: Tkai comes in today looking the best he has to me in a long time. He is in better mood better spirits. He seems less fatigued. He says he still a bit tired but much better than it was. He still bit paranoid about having it happened. A fallout that long ago when he tripped on the back of a chair. That was not do it all to dizziness or lightheadedness. He bruised himself all over his back but mostly notes some soreness from that. He denies any chest tightness or pressure or dyspnea with rest or exertion. No heart failure symptoms of PND, orthopnea or edema. No  palpitations, lightheadedness, dizziness, weakness or syncope/near syncope. No TIA/amaurosis fugax symptoms. No melena, hematochezia, hematuria, or epstaxis. No claudication.  ROS: A comprehensive was performed. Review of Systems  Constitutional: Negative for weight loss (Stable weight now.) and malaise/fatigue (less fatigued than before. Still a bit tired but better.).  Respiratory: Positive for shortness of breath (Occasionally with exertion).   Cardiovascular: Negative.  Negative for claudication.       Per history of present illness  Gastrointestinal: Negative for blood in stool and melena.  Genitourinary: Negative for hematuria.  Musculoskeletal: Positive for back pain, joint pain and falls (He tripped a few days ago).  Neurological: Positive for dizziness (Less orthostatic dizziness). Negative for loss of consciousness.  Endo/Heme/Allergies: Bruises/bleeds easily.  Psychiatric/Behavioral: Negative for depression. The patient is nervous/anxious (still a bit anxious about his diagnosis).   All other systems reviewed and are negative.   Current Outpatient Prescriptions on File Prior to Visit  Medication Sig Dispense Refill  . allopurinol (ZYLOPRIM) 300 MG tablet Take 300 mg by mouth daily.    Marland Kitchen ALPRAZolam (XANAX) 0.5 MG tablet Take 0.5 mg by mouth at bedtime.    Marland Kitchen aspirin EC 81 MG EC tablet Take 1 tablet (81 mg total) by mouth daily.    Marland Kitchen atorvastatin (LIPITOR) 80 MG tablet Take 40 mg by mouth daily.    . finasteride (PROSCAR) 5 MG tablet Take 5 mg by mouth daily.    . Multiple Vitamin (MULTIVITAMIN WITH MINERALS) TABS tablet Take 1 tablet by mouth daily.    Marland Kitchen omeprazole (PRILOSEC) 20 MG capsule Take 40 mg by mouth daily.     . rivaroxaban (XARELTO) 20 MG TABS tablet Take 1 tablet (20 mg total) by mouth daily with supper. 30 tablet 5  . sertraline (ZOLOFT) 50 MG tablet Take 50 mg by mouth daily.   0  . tamsulosin (FLOMAX) 0.4 MG CAPS capsule Take 0.4 mg by mouth at bedtime.     No  current facility-administered medications on file prior to visit.   No Known Allergies  SOCIAL AND FAMILY HISTORY REVIEWED IN EPIC -- NO change  Wt Readings from Last 3 Encounters:  06/19/14 197 lb 3.2 oz (89.449 kg)  05/17/14 200 lb (90.719 kg)  05/04/14 198 lb 12.8 oz (90.175 kg)   PHYSICAL EXAM BP 108/56 mmHg  Pulse 115  Ht 6\' 1"  (1.854 m)  Wt 197 lb 3.2 oz (89.449 kg)  BMI 26.02 kg/m2 General appearance: alert, cooperative, appears stated age; he looks much better today with more color, better affect. Neck: no adenopathy, no carotid bruit and no JVD Lungs: Improved basilar breath sounds, mostly CTA B, no rales or rhonchi. Normal effort Heart: RRR, S1&S2 normal (potentially physiologic S2 split), no murmur, click, rub or gallop; nondisplaced PMI Abdomen: soft, non-tender; bowel sounds normal; no  masses, no organomegaly; mild truncal obesity Extremities: extremities normal, atraumatic, no cyanosis,or edema Pulses: 2+ and symmetric; Skin: mobility and turgor normal Neurologic: Mental status: Alert, oriented, thought content appropriate Cranial nerves: normal (II-XII grossly intact)   Adult ECG Report  Rate: 115 ;  Rhythm: sinus tachycardia; baseline wander with mild nonspecific ST and T-wave changes.  Narrative Interpretation: Mostly stable EKG but increased heart rate.  ASSESSMENT / PLAN: Overall, finally doing better. Blood pressure and heart racing better. His rate is actually high so we should be able to start a beta blocker.  CAD STATUS post bare-metal stent to diagonal 2000 ([id number] 2.5x18), treated for in stent restenosis 2001 Cardiac cath showed multivessel CAD in November. Referred for CABG. Seems to be stabilized now and rehabbing quite well after a rocky postoperative course. Is doing cardiac rehabilitation and bearing relatively well.  Heart rate seems to have improved as has his blood pressure. I will restart metoprolol 12.5 mg twice a day. This had been fully  stopped because of profound fatigue recently.  We'll start it back on a very gradual basis.    S/P CABG x 5: LIMA-LAD, SVG-D1-D2, SVG-RPDA-RPL Slow recovery. Has at least one more follow-up with Dr. Servando Snare. He is doing well with cardiac rehabilitation.   Pulmonary embolism, bilateral, with pulmonary infarction He did have a significant PE burden with RV strain. RV should be recovering now. He should be on Xarelto for at least 6 months - but probably best for 1 year.. Would recheck echo at around that time to reassess RV function and overall LV function..   Essential hypertension He had significant hypertension precath /preop. Now was barely able to tolerate low-dose beta blocker. I suspect that over the next several months, we'll gradually restart his medications.   Hyperlipidemia with target LDL less than 70 He reduced atorvastatin to 40 mg daily. Should be due for lipid recheck at roughly the time of his follow-up visit.     Orders Placed This Encounter  Procedures  . EKG 12-Lead   Meds ordered this encounter  Medications  . metoprolol tartrate (LOPRESSOR) 25 MG tablet    Sig: Take 0.5 tablets (12.5 mg total) by mouth 2 (two) times daily.    Dispense:  90 tablet    Refill:  3    Followup: 3-4 months   HARDING, [name], M.D., M.S. Interventional Cardiologist   Pager # [phone]

## 2014-06-21 NOTE — Assessment & Plan Note (Signed)
Slow recovery. Has at least one more follow-up with Dr. Servando Snare. He is doing well with cardiac rehabilitation.

## 2014-06-22 ENCOUNTER — Encounter (HOSPITAL_COMMUNITY)
Admission: RE | Admit: 2014-06-22 | Discharge: 2014-06-22 | Disposition: A | Discharge status: Home / Self Care | Payer: Medicare HMO | Source: Ambulatory Visit | Attending: Cardiology | Admitting: Cardiology

## 2014-06-22 ENCOUNTER — Encounter (HOSPITAL_COMMUNITY): Payer: Medicare HMO

## 2014-06-22 DIAGNOSIS — I2510 Atherosclerotic heart disease of native coronary artery without angina pectoris: Secondary | ICD-10-CM | POA: Diagnosis not present

## 2014-06-25 ENCOUNTER — Encounter (HOSPITAL_COMMUNITY)
Admission: RE | Admit: 2014-06-25 | Discharge: 2014-06-25 | Disposition: A | Discharge status: Home / Self Care | Payer: Medicare HMO | Source: Ambulatory Visit | Attending: Cardiology | Admitting: Cardiology

## 2014-06-25 ENCOUNTER — Encounter (HOSPITAL_COMMUNITY): Payer: Medicare HMO

## 2014-06-25 DIAGNOSIS — I2510 Atherosclerotic heart disease of native coronary artery without angina pectoris: Secondary | ICD-10-CM | POA: Diagnosis not present

## 2014-06-27 ENCOUNTER — Encounter (HOSPITAL_COMMUNITY)
Admission: RE | Admit: 2014-06-27 | Discharge: 2014-06-27 | Disposition: A | Discharge status: Home / Self Care | Payer: Medicare HMO | Source: Ambulatory Visit | Attending: Cardiology | Admitting: Cardiology

## 2014-06-27 ENCOUNTER — Encounter (HOSPITAL_COMMUNITY): Payer: Medicare HMO

## 2014-06-27 DIAGNOSIS — I2510 Atherosclerotic heart disease of native coronary artery without angina pectoris: Secondary | ICD-10-CM | POA: Diagnosis not present

## 2014-06-28 ENCOUNTER — Encounter: Payer: Self-pay | Admitting: Cardiology

## 2014-06-28 ENCOUNTER — Ambulatory Visit (INDEPENDENT_AMBULATORY_CARE_PROVIDER_SITE_OTHER): Payer: Medicare HMO | Admitting: Cardiothoracic Surgery

## 2014-06-28 ENCOUNTER — Encounter: Payer: Self-pay | Admitting: Cardiothoracic Surgery

## 2014-06-28 VITALS — BP 116/67 | HR 94 | Resp 20 | Ht 73.0 in | Wt 197.0 lb

## 2014-06-28 DIAGNOSIS — Z951 Presence of aortocoronary bypass graft: Secondary | ICD-10-CM

## 2014-06-28 NOTE — Progress Notes (Signed)
[address]       [place] [address]             [phone]                  [place] Record #[id number] Date of Birth: Oct 13, 1935  Referring [id number], [name], MD Primary Cardiology: Primary Towanda Malkin, MD  Chief Complaint:  Follow Up Visit 03/21/2014  OPERATIVE REPORT PREOPERATIVE DIAGNOSIS: New-onset angina with three-vessel coronary artery disease and positive stress test. POSTOPERATIVE DIAGNOSIS: New-onset angina with three-vessel coronary artery disease and positive stress test. SURGICAL PROCEDURE: Coronary artery bypass grafting x5 with the left internal mammary to the left anterior descending coronary artery, sequential reverse saphenous vein graft to the first and second diagonal coronary artery, sequential reverse saphenous vein graft to the posterior descending and posterolateral branches of the right coronary artery with right thigh and calf endo vein harvesting. SURGEON: Lanelle Bal, MD  History of Present Illness:      The patient is seen in routine office follow-up following his above procedure. It is noted that he did have a recent hospitalization where he was found to have pulmonary embolisms and is now on xarelto. The patient primarily complains of significant fatigue but this is improving since he is enrolled in cardiac rehabilitation for the past 5 weeks. He's had no overt symptoms of congestive heart failure or  recurrent angina.  He denies fevers, chills or other constitutional symptoms.      Zubrod Score: At the time of surgery this patient's most appropriate activity status/level should be described as: []     0    Normal activity, no symptoms [x]     1    Restricted in physical strenuous activity but ambulatory, able to do out light work []     2    Ambulatory and capable of self care, unable to do work activities, up and about                 >50 % of waking hours                                                                                    []     3    Only limited self care, in bed greater than 50% of waking hours []     4    Completely disabled, no self care, confined to bed or chair []     5    Moribund  History  Smoking status  . Former Smoker -- 2.00 packs/day for 25 years  . Quit date: 04/20/1983  Smokeless tobacco  . Never Used       No Known Allergies  Current Outpatient Prescriptions  Medication Sig Dispense Refill  . allopurinol (ZYLOPRIM) 300 MG tablet Take 300 mg by mouth daily.    Marland Kitchen ALPRAZolam (XANAX) 0.5 MG tablet Take 0.5 mg by mouth at bedtime.    Marland Kitchen aspirin EC 81 MG EC tablet Take 1 tablet (81 mg total) by mouth daily.    Marland Kitchen atorvastatin (LIPITOR) 80 MG tablet Take 40 mg by mouth daily.    . celecoxib (  CELEBREX) 200 MG capsule Take 200 mg by mouth daily.    . finasteride (PROSCAR) 5 MG tablet Take 5 mg by mouth daily.    . metoprolol tartrate (LOPRESSOR) 25 MG tablet Take 0.5 tablets (12.5 mg total) by mouth 2 (two) times daily. (Patient taking differently: Take 12.5 mg by mouth daily. ) 90 tablet 3  . Multiple Vitamin (MULTIVITAMIN WITH MINERALS) TABS tablet Take 1 tablet by mouth daily.    Marland Kitchen omeprazole (PRILOSEC) 20 MG capsule Take 40 mg by mouth daily.     . rivaroxaban (XARELTO) 20 MG TABS tablet Take 1 tablet (20 mg total) by mouth daily with supper. 30 tablet 5  . sertraline (ZOLOFT) 50 MG tablet Take 50 mg by mouth daily.   0  . tamsulosin (FLOMAX) 0.4 MG CAPS capsule Take 0.4 mg by mouth at bedtime.     No current facility-administered medications for this visit.       Physical Exam: BP 116/67 mmHg  Pulse 94  Resp 20  Ht 6\' 1"  (1.854 m)  Wt 197 lb (89.359 kg)  BMI 26.00 kg/m2  SpO2 97%  General appearance: alert, cooperative and no distress Heart: regular rate and rhythm Lungs: dim in right base Abdomen: Benign exam Extremities: extremities normal, atraumatic, no cyanosis or edema Wound: Incisions healing well without  evidence of infection. Patient has no carotid bruits Palpable DP and PT pulses bilaterally  Diagnostic Studies & Laboratory data:         Recent Radiology Findings: No results found.    Recent Labs: Lab Results  Component Value Date   WBC 5.5 04/18/2014   HGB 9.1* 04/18/2014   HCT 28.8* 04/18/2014   PLT 229 04/18/2014   GLUCOSE 83 04/17/2014   ALT 34 04/15/2014   AST 36 04/15/2014   NA 137 04/17/2014   K 3.7 04/17/2014   CL 107 04/17/2014   CREATININE 0.83 04/17/2014   BUN 13 04/17/2014   CO2 20 04/17/2014   TSH 4.683* 03/12/2014   INR 1.27 04/15/2014   HGBA1C 5.6 03/21/2014      Assessment / Plan:  Overall the patient is doing well. His primary symptoms are related to fatigue. He will continue on xarelto for pulmonary for at least 6 months and then discuss continuation with Dr. Ellyn Hack  Importance of continuing on blood medication for his known pulmonary embolus was again reviewed with him Return as needed   ZUHAYR DEENEY 06/28/2014 10:43 AM

## 2014-06-29 ENCOUNTER — Telehealth: Payer: Self-pay | Admitting: Cardiology

## 2014-06-29 ENCOUNTER — Encounter (HOSPITAL_COMMUNITY)
Admission: RE | Admit: 2014-06-29 | Discharge: 2014-06-29 | Disposition: A | Discharge status: Home / Self Care | Payer: Medicare HMO | Source: Ambulatory Visit | Attending: Cardiology | Admitting: Cardiology

## 2014-06-29 ENCOUNTER — Encounter (HOSPITAL_COMMUNITY): Payer: Medicare HMO

## 2014-06-29 NOTE — Telephone Encounter (Signed)
cardiac rehab called and said the pt.told them he was to weak to and sore to exercise today and he went back home,but he did state he wanted to be seen today and he would call us later when he got back home, I just wanted you to know this since you just saw him yesterday , I am on the triage desk today but I'm Dr. Rosezella [place] nurse here at [facility]

## 2014-06-29 NOTE — Progress Notes (Signed)
Pt arrived to cardiac rehab complaining of feeling sore all over.  He mentioned this to Dr. Servando Snare on yesterday's appt.  Pt takes lipitor and has been on this for 25 years with no problems. Pt with recent change with the addition of metoprolol due to elevated hr with his office visit last week.  Pt also complains of headache.  Pt says the soreness is too much and he will not be able to exercise today.  Pt plans to call Dr. Darcus Pester office to see if he may be seen.  Offered to call on his behalf.  Message left with appointment desk staff to confer with nurse.  Pt provided telephone number that he may be contacted. Offered to call primary MD. Pt decline and said he would call when he got home.  Pt feels able to drive self home without any problems. Cherre Huger, BSN

## 2014-06-29 NOTE — Telephone Encounter (Signed)
Robert Boyle is calling in stating that the pt is complaining of being sore all over and he feels that it may be the Metoprolol. He would like to be seen today if possible. Please f/u with pt.   Thanks

## 2014-07-02 ENCOUNTER — Encounter (HOSPITAL_COMMUNITY)
Admission: RE | Admit: 2014-07-02 | Discharge: 2014-07-02 | Disposition: A | Discharge status: Home / Self Care | Payer: Medicare HMO | Source: Ambulatory Visit | Attending: Cardiology | Admitting: Cardiology

## 2014-07-02 ENCOUNTER — Encounter (HOSPITAL_COMMUNITY): Payer: Medicare HMO

## 2014-07-02 DIAGNOSIS — I2510 Atherosclerotic heart disease of native coronary artery without angina pectoris: Secondary | ICD-10-CM | POA: Diagnosis not present

## 2014-07-02 NOTE — Telephone Encounter (Signed)
Can this be closed ?

## 2014-07-04 ENCOUNTER — Encounter (HOSPITAL_COMMUNITY): Payer: Medicare HMO

## 2014-07-04 ENCOUNTER — Encounter (HOSPITAL_COMMUNITY)
Admission: RE | Admit: 2014-07-04 | Discharge: 2014-07-04 | Disposition: A | Discharge status: Home / Self Care | Payer: Medicare HMO | Source: Ambulatory Visit | Attending: Cardiology | Admitting: Cardiology

## 2014-07-04 DIAGNOSIS — I2510 Atherosclerotic heart disease of native coronary artery without angina pectoris: Secondary | ICD-10-CM | POA: Diagnosis not present

## 2014-07-06 ENCOUNTER — Encounter (HOSPITAL_COMMUNITY)
Admission: RE | Admit: 2014-07-06 | Discharge: 2014-07-06 | Disposition: A | Discharge status: Home / Self Care | Payer: Medicare HMO | Source: Ambulatory Visit | Attending: Cardiology | Admitting: Cardiology

## 2014-07-06 ENCOUNTER — Encounter (HOSPITAL_COMMUNITY): Payer: Medicare HMO

## 2014-07-06 DIAGNOSIS — I2510 Atherosclerotic heart disease of native coronary artery without angina pectoris: Secondary | ICD-10-CM | POA: Diagnosis not present

## 2014-07-09 ENCOUNTER — Encounter (HOSPITAL_COMMUNITY): Payer: Medicare HMO

## 2014-07-09 ENCOUNTER — Encounter (HOSPITAL_COMMUNITY)
Admission: RE | Admit: 2014-07-09 | Discharge: 2014-07-09 | Disposition: A | Discharge status: Home / Self Care | Payer: Medicare HMO | Source: Ambulatory Visit | Attending: Cardiology | Admitting: Cardiology

## 2014-07-09 DIAGNOSIS — I2510 Atherosclerotic heart disease of native coronary artery without angina pectoris: Secondary | ICD-10-CM | POA: Diagnosis not present

## 2014-07-11 ENCOUNTER — Encounter (HOSPITAL_COMMUNITY): Payer: Medicare HMO

## 2014-07-11 ENCOUNTER — Encounter (HOSPITAL_COMMUNITY)
Admission: RE | Admit: 2014-07-11 | Discharge: 2014-07-11 | Disposition: A | Discharge status: Home / Self Care | Payer: Medicare HMO | Source: Ambulatory Visit | Attending: Cardiology | Admitting: Cardiology

## 2014-07-11 DIAGNOSIS — I2510 Atherosclerotic heart disease of native coronary artery without angina pectoris: Secondary | ICD-10-CM | POA: Diagnosis not present

## 2014-07-13 ENCOUNTER — Encounter (HOSPITAL_COMMUNITY): Payer: Medicare HMO

## 2014-07-13 ENCOUNTER — Encounter (HOSPITAL_COMMUNITY)
Admission: RE | Admit: 2014-07-13 | Discharge: 2014-07-13 | Disposition: A | Discharge status: Home / Self Care | Payer: Medicare HMO | Source: Ambulatory Visit | Attending: Cardiology | Admitting: Cardiology

## 2014-07-13 DIAGNOSIS — I2510 Atherosclerotic heart disease of native coronary artery without angina pectoris: Secondary | ICD-10-CM | POA: Diagnosis not present

## 2014-07-16 ENCOUNTER — Encounter (HOSPITAL_COMMUNITY): Payer: Medicare HMO

## 2014-07-16 ENCOUNTER — Encounter (HOSPITAL_COMMUNITY)
Admission: RE | Admit: 2014-07-16 | Discharge: 2014-07-16 | Disposition: A | Discharge status: Home / Self Care | Payer: Medicare HMO | Source: Ambulatory Visit | Attending: Cardiology | Admitting: Cardiology

## 2014-07-16 DIAGNOSIS — I2510 Atherosclerotic heart disease of native coronary artery without angina pectoris: Secondary | ICD-10-CM | POA: Diagnosis not present

## 2014-07-18 ENCOUNTER — Encounter (HOSPITAL_COMMUNITY): Payer: Medicare HMO

## 2014-07-18 ENCOUNTER — Encounter (HOSPITAL_COMMUNITY)
Admission: RE | Admit: 2014-07-18 | Discharge: 2014-07-18 | Disposition: A | Discharge status: Home / Self Care | Payer: Medicare HMO | Source: Ambulatory Visit | Attending: Cardiology | Admitting: Cardiology

## 2014-07-18 DIAGNOSIS — I2510 Atherosclerotic heart disease of native coronary artery without angina pectoris: Secondary | ICD-10-CM | POA: Diagnosis not present

## 2014-07-20 ENCOUNTER — Encounter (HOSPITAL_COMMUNITY): Payer: Medicare HMO

## 2014-07-20 ENCOUNTER — Encounter (HOSPITAL_COMMUNITY)
Admission: RE | Admit: 2014-07-20 | Discharge: 2014-07-20 | Disposition: A | Discharge status: Home / Self Care | Payer: Medicare HMO | Source: Ambulatory Visit | Attending: Cardiology | Admitting: Cardiology

## 2014-07-20 DIAGNOSIS — Z951 Presence of aortocoronary bypass graft: Secondary | ICD-10-CM | POA: Insufficient documentation

## 2014-07-20 DIAGNOSIS — I10 Essential (primary) hypertension: Secondary | ICD-10-CM | POA: Diagnosis not present

## 2014-07-20 DIAGNOSIS — I2510 Atherosclerotic heart disease of native coronary artery without angina pectoris: Secondary | ICD-10-CM | POA: Diagnosis not present

## 2014-07-20 DIAGNOSIS — Z9861 Coronary angioplasty status: Secondary | ICD-10-CM | POA: Diagnosis not present

## 2014-07-20 DIAGNOSIS — E785 Hyperlipidemia, unspecified: Secondary | ICD-10-CM | POA: Insufficient documentation

## 2014-07-23 ENCOUNTER — Encounter (HOSPITAL_COMMUNITY)
Admission: RE | Admit: 2014-07-23 | Discharge: 2014-07-23 | Disposition: A | Discharge status: Home / Self Care | Payer: Medicare HMO | Source: Ambulatory Visit | Attending: Cardiology | Admitting: Cardiology

## 2014-07-23 ENCOUNTER — Encounter (HOSPITAL_COMMUNITY): Payer: Medicare HMO

## 2014-07-23 DIAGNOSIS — I2510 Atherosclerotic heart disease of native coronary artery without angina pectoris: Secondary | ICD-10-CM | POA: Diagnosis not present

## 2014-07-25 ENCOUNTER — Encounter (HOSPITAL_COMMUNITY)
Admission: RE | Admit: 2014-07-25 | Discharge: 2014-07-25 | Disposition: A | Discharge status: Home / Self Care | Payer: Medicare HMO | Source: Ambulatory Visit | Attending: Cardiology | Admitting: Cardiology

## 2014-07-25 ENCOUNTER — Encounter (HOSPITAL_COMMUNITY): Payer: Medicare HMO

## 2014-07-25 DIAGNOSIS — I2510 Atherosclerotic heart disease of native coronary artery without angina pectoris: Secondary | ICD-10-CM | POA: Diagnosis not present

## 2014-07-27 ENCOUNTER — Encounter (HOSPITAL_COMMUNITY)
Admission: RE | Admit: 2014-07-27 | Discharge: 2014-07-27 | Disposition: A | Discharge status: Home / Self Care | Payer: Medicare HMO | Source: Ambulatory Visit | Attending: Cardiology | Admitting: Cardiology

## 2014-07-27 ENCOUNTER — Encounter (HOSPITAL_COMMUNITY): Payer: Medicare HMO

## 2014-07-27 DIAGNOSIS — I2510 Atherosclerotic heart disease of native coronary artery without angina pectoris: Secondary | ICD-10-CM | POA: Diagnosis not present

## 2014-07-30 ENCOUNTER — Encounter (HOSPITAL_COMMUNITY)
Admission: RE | Admit: 2014-07-30 | Discharge: 2014-07-30 | Disposition: A | Discharge status: Home / Self Care | Payer: Medicare HMO | Source: Ambulatory Visit | Attending: Cardiology | Admitting: Cardiology

## 2014-07-30 ENCOUNTER — Encounter (HOSPITAL_COMMUNITY): Payer: Medicare HMO

## 2014-07-30 DIAGNOSIS — I2510 Atherosclerotic heart disease of native coronary artery without angina pectoris: Secondary | ICD-10-CM | POA: Diagnosis not present

## 2014-07-30 NOTE — Progress Notes (Signed)
Pt has follow up appt today with Dr. Ellyn Hack.  Pt plans to talk to MD regarding medications and how he is feeling since stopping his statin. Pt has restarted metoprolol 12.5 mg once a day.  Rehab report given to pt for md to review. Cherre Huger, BSN

## 2014-08-01 ENCOUNTER — Encounter (HOSPITAL_COMMUNITY): Payer: Medicare HMO

## 2014-08-01 ENCOUNTER — Encounter (HOSPITAL_COMMUNITY)
Admission: RE | Admit: 2014-08-01 | Discharge: 2014-08-01 | Disposition: A | Discharge status: Home / Self Care | Payer: Medicare HMO | Source: Ambulatory Visit | Attending: Cardiology | Admitting: Cardiology

## 2014-08-01 DIAGNOSIS — I2510 Atherosclerotic heart disease of native coronary artery without angina pectoris: Secondary | ICD-10-CM | POA: Diagnosis not present

## 2014-08-03 ENCOUNTER — Encounter (HOSPITAL_COMMUNITY)
Admission: RE | Admit: 2014-08-03 | Discharge: 2014-08-03 | Disposition: A | Discharge status: Home / Self Care | Payer: Medicare HMO | Source: Ambulatory Visit | Attending: Cardiology | Admitting: Cardiology

## 2014-08-03 ENCOUNTER — Encounter (HOSPITAL_COMMUNITY): Payer: Medicare HMO

## 2014-08-03 DIAGNOSIS — I2510 Atherosclerotic heart disease of native coronary artery without angina pectoris: Secondary | ICD-10-CM | POA: Diagnosis not present

## 2014-08-06 ENCOUNTER — Encounter (HOSPITAL_COMMUNITY): Payer: Medicare HMO

## 2014-08-06 ENCOUNTER — Encounter (HOSPITAL_COMMUNITY)
Admission: RE | Admit: 2014-08-06 | Discharge: 2014-08-06 | Disposition: A | Discharge status: Home / Self Care | Payer: Medicare HMO | Source: Ambulatory Visit | Attending: Cardiology | Admitting: Cardiology

## 2014-08-06 DIAGNOSIS — I2510 Atherosclerotic heart disease of native coronary artery without angina pectoris: Secondary | ICD-10-CM | POA: Diagnosis not present

## 2014-08-08 ENCOUNTER — Encounter (HOSPITAL_COMMUNITY): Payer: Medicare HMO

## 2014-08-08 ENCOUNTER — Encounter (HOSPITAL_COMMUNITY)
Admission: RE | Admit: 2014-08-08 | Discharge: 2014-08-08 | Disposition: A | Discharge status: Home / Self Care | Payer: Medicare HMO | Source: Ambulatory Visit | Attending: Cardiology | Admitting: Cardiology

## 2014-08-08 DIAGNOSIS — I2510 Atherosclerotic heart disease of native coronary artery without angina pectoris: Secondary | ICD-10-CM | POA: Diagnosis not present

## 2014-08-10 ENCOUNTER — Telehealth: Payer: Self-pay | Admitting: *Deleted

## 2014-08-10 ENCOUNTER — Encounter (HOSPITAL_COMMUNITY): Payer: Medicare HMO

## 2014-08-10 ENCOUNTER — Encounter (HOSPITAL_COMMUNITY)
Admission: RE | Admit: 2014-08-10 | Discharge: 2014-08-10 | Disposition: A | Discharge status: Home / Self Care | Payer: Medicare HMO | Source: Ambulatory Visit | Attending: Cardiology | Admitting: Cardiology

## 2014-08-10 DIAGNOSIS — I2510 Atherosclerotic heart disease of native coronary artery without angina pectoris: Secondary | ICD-10-CM | POA: Diagnosis not present

## 2014-08-10 NOTE — Telephone Encounter (Signed)
Faxed order for permission to continue in the maintenance cardiac rehab program after completion of phase 2 on 08/17/14

## 2014-08-13 ENCOUNTER — Encounter (HOSPITAL_COMMUNITY): Payer: Medicare HMO

## 2014-08-13 ENCOUNTER — Encounter (HOSPITAL_COMMUNITY)
Admission: RE | Admit: 2014-08-13 | Discharge: 2014-08-13 | Disposition: A | Discharge status: Home / Self Care | Payer: Medicare HMO | Source: Ambulatory Visit | Attending: Cardiology | Admitting: Cardiology

## 2014-08-13 DIAGNOSIS — I2510 Atherosclerotic heart disease of native coronary artery without angina pectoris: Secondary | ICD-10-CM | POA: Diagnosis not present

## 2014-08-15 ENCOUNTER — Encounter (HOSPITAL_COMMUNITY)
Admission: RE | Admit: 2014-08-15 | Discharge: 2014-08-15 | Disposition: A | Discharge status: Home / Self Care | Payer: Medicare HMO | Source: Ambulatory Visit | Attending: Cardiology | Admitting: Cardiology

## 2014-08-15 ENCOUNTER — Encounter (HOSPITAL_COMMUNITY): Payer: Medicare HMO

## 2014-08-15 DIAGNOSIS — I2510 Atherosclerotic heart disease of native coronary artery without angina pectoris: Secondary | ICD-10-CM | POA: Diagnosis not present

## 2014-08-15 NOTE — Progress Notes (Signed)
Patient graduates on Friday. Patient plans to continue exercise in the maintenance program. Robert Boyle says his goals have been met to increase his stamina. Robert Boyle reports  Still feeling fatigued at times. PHQ=0. Robert Boyle says he is still taking Zolft daily and denies feeling depressed at this time. We are proud of Robert Boyle's progress in phase 2 cardiac rehab.

## 2014-08-17 ENCOUNTER — Encounter (HOSPITAL_COMMUNITY): Payer: Medicare HMO

## 2014-08-17 ENCOUNTER — Encounter (HOSPITAL_COMMUNITY)
Admission: RE | Admit: 2014-08-17 | Discharge: 2014-08-17 | Disposition: A | Discharge status: Home / Self Care | Payer: Medicare HMO | Source: Ambulatory Visit | Attending: Cardiology | Admitting: Cardiology

## 2014-08-17 DIAGNOSIS — I2510 Atherosclerotic heart disease of native coronary artery without angina pectoris: Secondary | ICD-10-CM | POA: Diagnosis not present

## 2014-08-20 ENCOUNTER — Encounter (HOSPITAL_COMMUNITY): Payer: Medicare HMO

## 2014-08-20 ENCOUNTER — Encounter (HOSPITAL_COMMUNITY)
Admission: RE | Admit: 2014-08-20 | Discharge: 2014-08-20 | Disposition: A | Discharge status: Home / Self Care | Payer: Self-pay | Source: Ambulatory Visit | Attending: Cardiology | Admitting: Cardiology

## 2014-08-20 DIAGNOSIS — Z951 Presence of aortocoronary bypass graft: Secondary | ICD-10-CM | POA: Insufficient documentation

## 2014-08-20 DIAGNOSIS — Z48812 Encounter for surgical aftercare following surgery on the circulatory system: Secondary | ICD-10-CM | POA: Insufficient documentation

## 2014-08-22 ENCOUNTER — Encounter (HOSPITAL_COMMUNITY): Payer: Medicare HMO

## 2014-08-22 ENCOUNTER — Encounter (HOSPITAL_COMMUNITY)
Admission: RE | Admit: 2014-08-22 | Discharge: 2014-08-22 | Disposition: A | Discharge status: Home / Self Care | Payer: Self-pay | Source: Ambulatory Visit | Attending: Cardiology | Admitting: Cardiology

## 2014-08-24 ENCOUNTER — Encounter (HOSPITAL_COMMUNITY): Payer: Medicare HMO

## 2014-08-24 ENCOUNTER — Encounter (HOSPITAL_COMMUNITY)
Admission: RE | Admit: 2014-08-24 | Discharge: 2014-08-24 | Disposition: A | Discharge status: Home / Self Care | Payer: Self-pay | Source: Ambulatory Visit | Attending: Cardiology | Admitting: Cardiology

## 2014-08-27 ENCOUNTER — Encounter (HOSPITAL_COMMUNITY)
Admission: RE | Admit: 2014-08-27 | Discharge: 2014-08-27 | Disposition: A | Discharge status: Home / Self Care | Payer: Self-pay | Source: Ambulatory Visit | Attending: Cardiology | Admitting: Cardiology

## 2014-08-29 ENCOUNTER — Encounter (HOSPITAL_COMMUNITY)
Admission: RE | Admit: 2014-08-29 | Discharge: 2014-08-29 | Disposition: A | Discharge status: Home / Self Care | Payer: Self-pay | Source: Ambulatory Visit | Attending: Cardiology | Admitting: Cardiology

## 2014-08-31 ENCOUNTER — Encounter (HOSPITAL_COMMUNITY): Payer: Self-pay

## 2014-09-03 ENCOUNTER — Encounter (HOSPITAL_COMMUNITY)
Admission: RE | Admit: 2014-09-03 | Discharge: 2014-09-03 | Disposition: A | Discharge status: Home / Self Care | Payer: Self-pay | Source: Ambulatory Visit | Attending: Cardiology | Admitting: Cardiology

## 2014-09-05 ENCOUNTER — Encounter (HOSPITAL_COMMUNITY)
Admission: RE | Admit: 2014-09-05 | Discharge: 2014-09-05 | Disposition: A | Discharge status: Home / Self Care | Payer: Self-pay | Source: Ambulatory Visit | Attending: Cardiology | Admitting: Cardiology

## 2014-09-07 ENCOUNTER — Encounter (HOSPITAL_COMMUNITY)
Admission: RE | Admit: 2014-09-07 | Discharge: 2014-09-07 | Disposition: A | Discharge status: Home / Self Care | Payer: Self-pay | Source: Ambulatory Visit | Attending: Cardiology | Admitting: Cardiology

## 2014-09-10 ENCOUNTER — Encounter (HOSPITAL_COMMUNITY)
Admission: RE | Admit: 2014-09-10 | Discharge: 2014-09-10 | Disposition: A | Discharge status: Home / Self Care | Payer: Self-pay | Source: Ambulatory Visit | Attending: Cardiology | Admitting: Cardiology

## 2014-09-12 ENCOUNTER — Encounter (HOSPITAL_COMMUNITY)
Admission: RE | Admit: 2014-09-12 | Discharge: 2014-09-12 | Disposition: A | Discharge status: Home / Self Care | Payer: Self-pay | Source: Ambulatory Visit | Attending: Cardiology | Admitting: Cardiology

## 2014-09-14 ENCOUNTER — Encounter (HOSPITAL_COMMUNITY)
Admission: RE | Admit: 2014-09-14 | Discharge: 2014-09-14 | Disposition: A | Discharge status: Home / Self Care | Payer: Self-pay | Source: Ambulatory Visit | Attending: Cardiology | Admitting: Cardiology

## 2014-09-18 ENCOUNTER — Telehealth: Payer: Self-pay | Admitting: Cardiology

## 2014-09-18 MED ORDER — RIVAROXABAN 20 MG PO TABS: 20.0000 mg | ORAL_TABLET | Freq: Every day | ORAL | Status: DC

## 2014-09-18 NOTE — Telephone Encounter (Signed)
Pt called in wanting to know if it was all right for him to stop taking his Eliquis even tho he has 6 pills left. He says that he was instructed by Dr. Ellyn Hack to discontinue the medicine at the beginning of June and just wanted to make sure he was following the doctors orders. Please call  Thanks

## 2014-09-18 NOTE — Telephone Encounter (Signed)
Called pt, clarified he was calling about Xarelto, not Eliquis - advised to continue until seen by Dr. Ellyn Hack & if changes indicated at next [place]. Pt voiced understanding, will continue current dose. He requested refill sent to [facility] - voiced understanding that it would need to be submitted for 90 day supply. E-request submitted.

## 2014-09-19 ENCOUNTER — Encounter (HOSPITAL_COMMUNITY): Payer: Medicare HMO

## 2014-09-19 DIAGNOSIS — Z48812 Encounter for surgical aftercare following surgery on the circulatory system: Secondary | ICD-10-CM | POA: Insufficient documentation

## 2014-09-19 DIAGNOSIS — Z951 Presence of aortocoronary bypass graft: Secondary | ICD-10-CM | POA: Insufficient documentation

## 2014-09-21 ENCOUNTER — Encounter (HOSPITAL_COMMUNITY): Payer: Self-pay

## 2014-09-24 ENCOUNTER — Encounter (HOSPITAL_COMMUNITY)
Admission: RE | Admit: 2014-09-24 | Discharge: 2014-09-24 | Disposition: A | Discharge status: Home / Self Care | Payer: Self-pay | Source: Ambulatory Visit | Attending: Cardiology | Admitting: Cardiology

## 2014-09-26 ENCOUNTER — Encounter (HOSPITAL_COMMUNITY)
Admission: RE | Admit: 2014-09-26 | Discharge: 2014-09-26 | Disposition: A | Discharge status: Home / Self Care | Payer: Self-pay | Source: Ambulatory Visit | Attending: Cardiology | Admitting: Cardiology

## 2014-09-28 ENCOUNTER — Encounter (HOSPITAL_COMMUNITY)
Admission: RE | Admit: 2014-09-28 | Discharge: 2014-09-28 | Disposition: A | Discharge status: Home / Self Care | Payer: Self-pay | Source: Ambulatory Visit | Attending: Cardiology | Admitting: Cardiology

## 2014-10-01 ENCOUNTER — Encounter (HOSPITAL_COMMUNITY)
Admission: RE | Admit: 2014-10-01 | Discharge: 2014-10-01 | Disposition: A | Discharge status: Home / Self Care | Payer: Self-pay | Source: Ambulatory Visit | Attending: Cardiology | Admitting: Cardiology

## 2014-10-03 ENCOUNTER — Encounter (HOSPITAL_COMMUNITY)
Admission: RE | Admit: 2014-10-03 | Discharge: 2014-10-03 | Disposition: A | Discharge status: Home / Self Care | Payer: Self-pay | Source: Ambulatory Visit | Attending: Cardiology | Admitting: Cardiology

## 2014-10-05 ENCOUNTER — Encounter (HOSPITAL_COMMUNITY)
Admission: RE | Admit: 2014-10-05 | Discharge: 2014-10-05 | Disposition: A | Discharge status: Home / Self Care | Payer: Self-pay | Source: Ambulatory Visit | Attending: Cardiology | Admitting: Cardiology

## 2014-10-08 ENCOUNTER — Encounter (HOSPITAL_COMMUNITY)
Admission: RE | Admit: 2014-10-08 | Discharge: 2014-10-08 | Disposition: A | Discharge status: Home / Self Care | Payer: Self-pay | Source: Ambulatory Visit | Attending: Cardiology | Admitting: Cardiology

## 2014-10-10 ENCOUNTER — Encounter (HOSPITAL_COMMUNITY)
Admission: RE | Admit: 2014-10-10 | Discharge: 2014-10-10 | Disposition: A | Discharge status: Home / Self Care | Payer: Self-pay | Source: Ambulatory Visit | Attending: Cardiology | Admitting: Cardiology

## 2014-10-11 ENCOUNTER — Encounter: Payer: Self-pay | Admitting: Cardiology

## 2014-10-11 ENCOUNTER — Ambulatory Visit (INDEPENDENT_AMBULATORY_CARE_PROVIDER_SITE_OTHER): Payer: Medicare HMO | Admitting: Cardiology

## 2014-10-11 VITALS — BP 140/60 | HR 70 | Ht 73.0 in | Wt 201.3 lb

## 2014-10-11 DIAGNOSIS — I10 Essential (primary) hypertension: Secondary | ICD-10-CM

## 2014-10-11 DIAGNOSIS — R200 Anesthesia of skin: Secondary | ICD-10-CM

## 2014-10-11 DIAGNOSIS — Z951 Presence of aortocoronary bypass graft: Secondary | ICD-10-CM

## 2014-10-11 DIAGNOSIS — E785 Hyperlipidemia, unspecified: Secondary | ICD-10-CM

## 2014-10-11 DIAGNOSIS — R202 Paresthesia of skin: Secondary | ICD-10-CM

## 2014-10-11 DIAGNOSIS — I25119 Atherosclerotic heart disease of native coronary artery with unspecified angina pectoris: Secondary | ICD-10-CM | POA: Diagnosis not present

## 2014-10-11 DIAGNOSIS — R5383 Other fatigue: Secondary | ICD-10-CM

## 2014-10-11 DIAGNOSIS — I2699 Other pulmonary embolism without acute cor pulmonale: Secondary | ICD-10-CM

## 2014-10-11 NOTE — Patient Instructions (Addendum)
It will okay to use the supplement that you and Dr Ellyn Hack discusses  Continue all other medications  Your physician has requested that you have a carotid duplex. This test is an ultrasound of the carotid arteries in your neck. It looks at blood flow through these arteries that supply the brain with blood. Allow one hour for this exam. There are no restrictions or special instructions.  Your physician has requested that you have a upper extremity arterial duplex -LOOK AT LEFT SUBCLAVIAN . This test is an ultrasound of the arteries in the  arms. It looks at arterial blood flow in the arms. Allow one hour forUpper Arterial scans. There are no restrictions or special instructions   Your physician wants you to follow-up in Dec 2016- DR HARDING.  You will receive a reminder letter in the mail two months in advance. If you don't receive a letter, please call our office to schedule the follow-up appointment.

## 2014-10-11 NOTE — Progress Notes (Signed)
PCP: Robert Noon, MD  Clinic Note: Chief Complaint  Patient presents with  . Fatigue  . Coronary Artery Disease    CABG    HPI: Robert Boyle is a 79 y.o. male with a PMH below who presents today for 3 month f/u for CAD-CABG.  He is a former patient of Dr. Chase Boyle cardiac history dates back to 2000 He had a bare-metal stent placed in the first diagonal branch. One year later he had PTCA of the stent restenosis. He did well until the fall 20/15 when he began noticing worsening exertional dyspnea and eventually developed chest heaviness and pressure concerning for crescendo angina. He had a Myoview stress test showed inferior-inferolateral ischemia in November 15 that led to cardiac catheterization finding multivessel disease and evaluate CABGx5.  His postop course was complicated right remission several weeks later with bilateral acute pulmonary emboli. It took him longer than usual to recover and has been on anticoagulation for the PE.  He has been struggling to recover from both episodes. He recently graduated from cardiac rehabilitation and I started the maintenance program. He basically feels comfortable with this level of exercise being monitored. He is starting his energy back and feeling more like himself.   Interval History: Robert Boyle comes in today looking about as good as I seen him in over a year. He actually went out and played golf last weekend his belt and son. He is definitely noticing that his energy level is bouncing back. He was having some myalgias with atorvastatin so his PCP (Dr. Melford Boyle) switched him to a store. Is doing fine with that. He basically says he "feels good "just still have some fatigue issues.he was however able play golf and do routine exercises recently as far at the 120s. From a cardiac standpoint, his review this symptoms is as follows: No chest pain or shortness of breath with rest or exertion.  No PND, orthopnea or edema.  No  palpitations, lightheadedness, dizziness, weakness or syncope/near syncope, or TIA/amaurosis fugax symptoms.  -- orthostatic dizziness has definitely improved No claudication.  He has noted some strange sensations in his left arm where he has numbness and weakness on intermittently depending on how he sleeps. Also when he uses the left arm. This is happening at least 2-3 times and is somewhat concerning to him.  Past Medical History  Diagnosis Date  . CAD S/P percutaneous coronary angioplasty 2000, 2001, 03/2014    a) 2000: BMS to D1 75% stenosis (~2.5 mm x 18 mm AVE BMS); b) 2001 ISR of D1 stent - PTCA w/ 2.5 mm Balloon;; c) Myoview 11/'15: Intermediate Risk, inferior-inferolateral ischemia --> Cath: 40% proximal LAD with 90% mid; 90% mid D1 and D2; 100% proximal RCA with left-to-right collaterals for both PL and PDA --> CABG    . 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 Acute pulmonary embolus 04/13/14    CTA: Bilateral Main PAs; suggestion of increased RV strain & R basal pulmonary infarct,  Mild dilation of the ascending aorta 2.8 cm noted. --> On Xarelto  . Essential hypertension   . Hyperlipidemia with target LDL less than 70   . Prostate cancer 05/23/13    gleason 4+3=7, vloume 34.32 cc; s/p Chemo-XRT  . History of radiation therapy 09/06/13- 11/02/13    prostate 7600 cGy 40 sessions, seminal vesicles 5600 cGy 40 sessions  . Arthritis   . GERD (gastroesophageal reflux disease)   . H/O renal calculi   .  Shortness of breath dyspnea   . Gastrointestinal complaints   . Gout   . Arthritis     Past Surgical History  Procedure Laterality Date  . Coronary angioplasty with stent placement  10/17/1998    PCI with ~2.5 mm x 18 mm AVE BMS to D1  . Coronary angioplasty  07/21/1999    PTCA to ISR stenosis to LAD  . Nm myocar perf wall motion  11/06/2011    low risk 63% ; FIXED INFEROSEPTAL GUT ATTENUATION  . Prostate biopsy  05/23/13    gleason 4+3=7, vol 34.32 cc  .  Cholecystectomy  01/22/10  . Back surgery  1995    lumbar  . Coronary artery bypass graft N/A 03/21/2014    Procedure: CORONARY ARTERY BYPASS GRAFTING (CABG) TIMES FIVE USING LEFT INTERNAL MAMMARY TO LAD, SAPHENOUS VEIN GRAFT TO THE PD/PL, SAPHENOUS VEIN GRAFT TO DIAGONAL 1 AND 2 SEQUENTIALLY, SAPHENOUS VEIN HARVESTED ENDOSCOPICALLY;  Surgeon: Robert Isaac, MD;  Location: [facility];  Service: Open Heart Surgery;  Laterality: N/A;  . Tee without cardioversion N/A 03/21/2014    Procedure: TRANSESOPHAGEAL ECHOCARDIOGRAM (TEE);  Surgeon: Robert Isaac, MD;  Location: [facility];  Service: Open Heart Surgery;  Laterality: N/A;  . Left heart catheterization with coronary angiogram N/A 03/20/2014    Procedure: LEFT HEART CATHETERIZATION WITH CORONARY ANGIOGRAM;  Surgeon: Robert Man, MD;  Location: [facility] CATH LAB;  Service: Cardiovascular;  Laterality: N/A;    ROS: A comprehensive was performed. Review of Systems  Constitutional: Positive for malaise/fatigue (But improving on a weekly basis).  HENT: Negative for nosebleeds.   Respiratory: Negative for cough and shortness of breath.   Cardiovascular: Negative for claudication.  Gastrointestinal: Negative for blood in stool and melena.  Genitourinary: Negative for hematuria.  Musculoskeletal: Negative for myalgias (Notably improved since switching from atorvastatin to Crestor) and falls.  Neurological:       Notable left arm numbness and pain/weakness  Endo/Heme/Allergies: Bruises/bleeds easily (would like to know when he can stop Xarelto).  Psychiatric/Behavioral:       Overall, his mood has been notably improved. He has anxiety level is doubly gotten better and is not having take the Xanax as much. He is no longer taking an SSRI.  All other systems reviewed and are negative.   Current Outpatient Prescriptions on File Prior to Visit  Medication Sig Dispense Refill  . allopurinol (ZYLOPRIM) 300 MG tablet Take 300 mg by mouth daily.    Marland Kitchen ALPRAZolam  (XANAX) 0.5 MG tablet Take 0.5 mg by mouth at bedtime.    Marland Kitchen aspirin EC 81 MG EC tablet Take 1 tablet (81 mg total) by mouth daily.    . celecoxib (CELEBREX) 200 MG capsule Take 200 mg by mouth daily.    . metoprolol tartrate (LOPRESSOR) 25 MG tablet Take 0.5 tablets (12.5 mg total) by mouth 2 (two) times daily. (Patient taking differently: Take 12.5 mg by mouth daily. ) 90 tablet 3  . Multiple Vitamin (MULTIVITAMIN WITH MINERALS) TABS tablet Take 1 tablet by mouth daily.    Marland Kitchen omeprazole (PRILOSEC) 20 MG capsule Take 40 mg by mouth daily.     . rivaroxaban (XARELTO) 20 MG TABS tablet Take 1 tablet (20 mg total) by mouth daily with supper. 90 tablet 1   No current facility-administered medications on file prior to visit.   No Known Allergies  History  Substance Use Topics  . Smoking status: Former Smoker -- 2.00 packs/day for 25 years    Quit  date: 04/20/1983  . Smokeless tobacco: Never Used  . Alcohol Use: 0.0 oz/week    0 Standard drinks or equivalent per week     Comment: 3 a day   Family History  Problem Relation Age of Onset  . Heart attack Father   . Stroke Mother   . Heart attack Brother   . Cancer Brother     one brother-prostate     Wt Readings from Last 3 Encounters:  10/11/14 91.309 kg (201 lb 4.8 oz)  06/28/14 89.359 kg (197 lb)  06/19/14 89.449 kg (197 lb 3.2 oz)    PHYSICAL EXAM BP 140/60 mmHg  Pulse 70  Ht 6\' 1"  (1.854 m)  Wt 91.309 kg (201 lb 4.8 oz)  BMI 26.56 kg/m2 General appearance: alert, cooperative, appears stated age; he looks much better today with more color, better affect. Neck: no adenopathy, no carotid bruit and no JVD Lungs: Improved basilar breath sounds, mostly CTA B, no rales or rhonchi. Normal effort Heart: RRR, S1&S2 normal (physiologic S2 split), no murmur, click, rub or gallop; nondisplaced PMI Abdomen: soft, non-tender; bowel sounds normal; no masses, no organomegaly; mild truncal obesity Extremities: extremities normal, atraumatic,  no cyanosis,or edema Pulses: 2+ and symmetric; Skin: mobility and turgor normal Neurologic: Mental status: Alert, oriented, thought content appropriate Cranial nerves: normal (II-XII grossly intact)   Adult ECG Report - not checked  Other studies Reviewed: Additional studies/ records that were reviewed today include:  Review of the above records demonstrates: - no new studies   ASSESSMENT / PLAN: Problem List Items Addressed This Visit    Atherosclerotic heart disease native coronary artery w/angina pectoris -- Sent for CABG (Chronic)    Doing great with cardiac rehabilitation. No anginal symptoms. No heart failure symptoms. Only able to tolerate very low dose beta blocker at 12.5 twice a day. Is now on Crestor (my preferred statin). He is on aspirin but not on other antiplatelet agent as he is now on Xarelto.  Most likely in the next year so we will be checking an initial post CABG Myoview. Otherwise no new studies needed for now.      Relevant Medications   rosuvastatin (CRESTOR) 20 MG tablet   Essential hypertension (Chronic)    Pre-preop he was hypertensive, however postop he really has not been hypertensive. Blood pressures are just borderline right now on low-dose beta blocker. Since his fatigue is coming improving I think we will continue to maintain current regimen and allow mild progressive hypertension. In the future, we may be able to restart ARB/ACE inhibitor.      Relevant Medications   rosuvastatin (CRESTOR) 20 MG tablet   Fatigue due to treatment    I would like to continue to allow permissive hypertension.I cut out his Norvasc and ACE inhibitor/ARB in the past because of low blood pressures. We also switched to Crestor which is much last likely to have a fatigue side effect than higher dose atorvastatin      Hyperlipidemia with target LDL less than 70 (Chronic)    His PCP switched her from atorvastatin to Crestor. I presume that he is probably due to have labs  checked by his PCP soon. Okay to use coenzyme Q10 as well as omega-3 fatty acids and red yeast rice for supplementation.      Relevant Medications   rosuvastatin (CRESTOR) 20 MG tablet   Pulmonary embolism, bilateral, with pulmonary infarction (Chronic)    Significant PE burden. In bilateral PEs with RV strain therefore think  we should probably continue for an evaluation for a year. We will recheck an echo this winter to reassess RV and LV function. Our management that time we can probably stop anticoagulation and consider Plavix over aspirin.      Relevant Medications   rosuvastatin (CRESTOR) 20 MG tablet   S/P CABG x 5: LIMA-LAD, SVG-D1-D2, SVG-RPDA-RPL - Primary    Finally starting to recover better. His source is wearing off and energy is improving. I will be concerned about the symptoms in the left arm with numbness and weakness that is more concerning since he now is living off the LIMA. I don't notice to the left side, however it is reasonable to evaluate subclavian outflow. Plan: Carotid and left upper extremity arterial Dopplers.      Relevant Orders   Carotid   ARTERIAL DOPPLER    Other Visit Diagnoses    Numbness and tingling in left arm        Relevant Orders    Carotid    ARTERIAL DOPPLER       Current medicines are reviewed at length with the patient today. (+/- concerns) how much longer does he need to take Xarelto.  He also had questions about using supplements such as a red yeast rice, coenzyme Q 10 and omega-3 fatty acids. -- All are acceptable The following changes have been made: N/A labs/ tests ordered today include:   No orders of the defined types were placed in this encounter.   Meds ordered this encounter  Medications  . rosuvastatin (CRESTOR) 20 MG tablet    Sig: Take 20 mg by mouth daily.     Followup: 6 months    HARDING, Robert Green, M.D., M.S. Interventional Cardiologist   Pager # [phone]

## 2014-10-12 ENCOUNTER — Encounter (HOSPITAL_COMMUNITY)
Admission: RE | Admit: 2014-10-12 | Discharge: 2014-10-12 | Disposition: A | Discharge status: Home / Self Care | Payer: Self-pay | Source: Ambulatory Visit | Attending: Cardiology | Admitting: Cardiology

## 2014-10-13 ENCOUNTER — Encounter: Payer: Self-pay | Admitting: Cardiology

## 2014-10-13 NOTE — Assessment & Plan Note (Signed)
Finally starting to recover better. His source is wearing off and energy is improving. I will be concerned about the symptoms in the left arm with numbness and weakness that is more concerning since he now is living off the LIMA. I don't notice to the left side, however it is reasonable to evaluate subclavian outflow. Plan: Carotid and left upper extremity arterial Dopplers.

## 2014-10-13 NOTE — Assessment & Plan Note (Addendum)
His PCP switched her from atorvastatin to Crestor. I presume that he is probably due to have labs checked by his PCP soon. Okay to use coenzyme Q10 as well as omega-3 fatty acids and red yeast rice for supplementation.

## 2014-10-13 NOTE — Assessment & Plan Note (Signed)
Pre-preop he was hypertensive, however postop he really has not been hypertensive. Blood pressures are just borderline right now on low-dose beta blocker. Since his fatigue is coming improving I think we will continue to maintain current regimen and allow mild progressive hypertension. In the future, we may be able to restart ARB/ACE inhibitor.

## 2014-10-13 NOTE — Assessment & Plan Note (Signed)
Significant PE burden. In bilateral PEs with RV strain therefore think we should probably continue for an evaluation for a year. We will recheck an echo this winter to reassess RV and LV function. Our management that time we can probably stop anticoagulation and consider Plavix over aspirin.

## 2014-10-13 NOTE — Assessment & Plan Note (Signed)
I would like to continue to allow permissive hypertension.I cut out his Norvasc and ACE inhibitor/ARB in the past because of low blood pressures. We also switched to Crestor which is much last likely to have a fatigue side effect than higher dose atorvastatin

## 2014-10-13 NOTE — Assessment & Plan Note (Signed)
Doing great with cardiac rehabilitation. No anginal symptoms. No heart failure symptoms. Only able to tolerate very low dose beta blocker at 12.5 twice a day. Is now on Crestor (my preferred statin). He is on aspirin but not on other antiplatelet agent as he is now on Xarelto.  Most likely in the next year so we will be checking an initial post CABG Myoview. Otherwise no new studies needed for now.

## 2014-10-15 ENCOUNTER — Other Ambulatory Visit: Payer: Self-pay | Admitting: *Deleted

## 2014-10-15 ENCOUNTER — Encounter (HOSPITAL_COMMUNITY)
Admission: RE | Admit: 2014-10-15 | Discharge: 2014-10-15 | Disposition: A | Discharge status: Home / Self Care | Payer: Self-pay | Source: Ambulatory Visit | Attending: Cardiology | Admitting: Cardiology

## 2014-10-17 ENCOUNTER — Encounter (HOSPITAL_COMMUNITY): Payer: Self-pay

## 2014-10-19 ENCOUNTER — Encounter (HOSPITAL_COMMUNITY): Payer: Medicare HMO

## 2014-10-19 DIAGNOSIS — Z951 Presence of aortocoronary bypass graft: Secondary | ICD-10-CM | POA: Insufficient documentation

## 2014-10-19 DIAGNOSIS — Z48812 Encounter for surgical aftercare following surgery on the circulatory system: Secondary | ICD-10-CM | POA: Insufficient documentation

## 2014-10-19 HISTORY — PX: OTHER SURGICAL HISTORY: SHX169

## 2014-10-24 ENCOUNTER — Encounter (HOSPITAL_COMMUNITY)
Admission: RE | Admit: 2014-10-24 | Discharge: 2014-10-24 | Disposition: A | Discharge status: Home / Self Care | Payer: Self-pay | Source: Ambulatory Visit | Attending: Cardiology | Admitting: Cardiology

## 2014-10-26 ENCOUNTER — Encounter (HOSPITAL_COMMUNITY)
Admission: RE | Admit: 2014-10-26 | Discharge: 2014-10-26 | Disposition: A | Discharge status: Home / Self Care | Payer: Self-pay | Source: Ambulatory Visit | Attending: Cardiology | Admitting: Cardiology

## 2014-10-29 ENCOUNTER — Encounter (HOSPITAL_COMMUNITY): Payer: Self-pay

## 2014-10-31 ENCOUNTER — Encounter (HOSPITAL_COMMUNITY): Payer: Self-pay

## 2014-11-02 ENCOUNTER — Ambulatory Visit (HOSPITAL_COMMUNITY)
Admission: RE | Admit: 2014-11-02 | Discharge: 2014-11-02 | Disposition: A | Discharge status: Home / Self Care | Payer: Medicare HMO | Source: Ambulatory Visit | Attending: Cardiology | Admitting: Cardiology

## 2014-11-02 ENCOUNTER — Encounter (HOSPITAL_COMMUNITY)
Admission: RE | Admit: 2014-11-02 | Discharge: 2014-11-02 | Disposition: A | Discharge status: Home / Self Care | Payer: Self-pay | Source: Ambulatory Visit | Attending: Cardiology | Admitting: Cardiology

## 2014-11-02 DIAGNOSIS — Z951 Presence of aortocoronary bypass graft: Secondary | ICD-10-CM | POA: Insufficient documentation

## 2014-11-02 DIAGNOSIS — R200 Anesthesia of skin: Secondary | ICD-10-CM

## 2014-11-02 DIAGNOSIS — R202 Paresthesia of skin: Secondary | ICD-10-CM

## 2014-11-02 NOTE — Progress Notes (Addendum)
PRELIMINARY RESULTS BY TECH -  Carotid Duplex Completed. No evidence of stenosis noted in bilateral carotid arteries or subclavian arteries. Bilateral vertebral arteries demonstrate normal antegrade flow.  Oda Cogan, BS, RDMS, RVT

## 2014-11-05 ENCOUNTER — Encounter (HOSPITAL_COMMUNITY)
Admission: RE | Admit: 2014-11-05 | Discharge: 2014-11-05 | Disposition: A | Discharge status: Home / Self Care | Payer: Self-pay | Source: Ambulatory Visit | Attending: Cardiology | Admitting: Cardiology

## 2014-11-07 ENCOUNTER — Encounter (HOSPITAL_COMMUNITY)
Admission: RE | Admit: 2014-11-07 | Discharge: 2014-11-07 | Disposition: A | Discharge status: Home / Self Care | Payer: Self-pay | Source: Ambulatory Visit | Attending: Cardiology | Admitting: Cardiology

## 2014-11-09 ENCOUNTER — Encounter (HOSPITAL_COMMUNITY): Payer: Self-pay

## 2014-11-12 ENCOUNTER — Encounter (HOSPITAL_COMMUNITY): Payer: Self-pay

## 2014-11-14 ENCOUNTER — Encounter (HOSPITAL_COMMUNITY): Payer: Self-pay

## 2014-11-16 ENCOUNTER — Encounter (HOSPITAL_COMMUNITY)
Admission: RE | Admit: 2014-11-16 | Discharge: 2014-11-16 | Disposition: A | Discharge status: Home / Self Care | Payer: Self-pay | Source: Ambulatory Visit | Attending: Cardiology | Admitting: Cardiology

## 2014-11-19 ENCOUNTER — Encounter (HOSPITAL_COMMUNITY)
Admission: RE | Admit: 2014-11-19 | Discharge: 2014-11-19 | Disposition: A | Discharge status: Home / Self Care | Payer: Self-pay | Source: Ambulatory Visit | Attending: Cardiology | Admitting: Cardiology

## 2014-11-19 DIAGNOSIS — Z951 Presence of aortocoronary bypass graft: Secondary | ICD-10-CM | POA: Insufficient documentation

## 2014-11-19 DIAGNOSIS — Z48812 Encounter for surgical aftercare following surgery on the circulatory system: Secondary | ICD-10-CM | POA: Insufficient documentation

## 2014-11-21 ENCOUNTER — Encounter (HOSPITAL_COMMUNITY)
Admission: RE | Admit: 2014-11-21 | Discharge: 2014-11-21 | Disposition: A | Discharge status: Home / Self Care | Payer: Self-pay | Source: Ambulatory Visit | Attending: Cardiology | Admitting: Cardiology

## 2014-11-23 ENCOUNTER — Encounter (HOSPITAL_COMMUNITY)
Admission: RE | Admit: 2014-11-23 | Discharge: 2014-11-23 | Disposition: A | Discharge status: Home / Self Care | Payer: Self-pay | Source: Ambulatory Visit | Attending: Cardiology | Admitting: Cardiology

## 2014-11-26 ENCOUNTER — Encounter (HOSPITAL_COMMUNITY)
Admission: RE | Admit: 2014-11-26 | Discharge: 2014-11-26 | Disposition: A | Discharge status: Home / Self Care | Payer: Self-pay | Source: Ambulatory Visit | Attending: Cardiology | Admitting: Cardiology

## 2014-11-28 ENCOUNTER — Encounter (HOSPITAL_COMMUNITY)
Admission: RE | Admit: 2014-11-28 | Discharge: 2014-11-28 | Disposition: A | Discharge status: Home / Self Care | Payer: Self-pay | Source: Ambulatory Visit | Attending: Cardiology | Admitting: Cardiology

## 2014-11-30 ENCOUNTER — Encounter (HOSPITAL_COMMUNITY)
Admission: RE | Admit: 2014-11-30 | Discharge: 2014-11-30 | Disposition: A | Discharge status: Home / Self Care | Payer: Self-pay | Source: Ambulatory Visit | Attending: Cardiology | Admitting: Cardiology

## 2014-12-05 ENCOUNTER — Encounter (HOSPITAL_COMMUNITY)
Admission: RE | Admit: 2014-12-05 | Discharge: 2014-12-05 | Disposition: A | Discharge status: Home / Self Care | Payer: Self-pay | Source: Ambulatory Visit | Attending: Cardiology | Admitting: Cardiology

## 2014-12-07 ENCOUNTER — Encounter (HOSPITAL_COMMUNITY)
Admission: RE | Admit: 2014-12-07 | Discharge: 2014-12-07 | Disposition: A | Discharge status: Home / Self Care | Payer: Self-pay | Source: Ambulatory Visit | Attending: Cardiology | Admitting: Cardiology

## 2014-12-10 ENCOUNTER — Encounter (HOSPITAL_COMMUNITY)
Admission: RE | Admit: 2014-12-10 | Discharge: 2014-12-10 | Disposition: A | Discharge status: Home / Self Care | Payer: Self-pay | Source: Ambulatory Visit | Attending: Cardiology | Admitting: Cardiology

## 2014-12-12 ENCOUNTER — Encounter (HOSPITAL_COMMUNITY)
Admission: RE | Admit: 2014-12-12 | Discharge: 2014-12-12 | Disposition: A | Discharge status: Home / Self Care | Payer: Self-pay | Source: Ambulatory Visit | Attending: Cardiology | Admitting: Cardiology

## 2014-12-14 ENCOUNTER — Encounter (HOSPITAL_COMMUNITY)
Admission: RE | Admit: 2014-12-14 | Discharge: 2014-12-14 | Disposition: A | Discharge status: Home / Self Care | Payer: Self-pay | Source: Ambulatory Visit | Attending: Cardiology | Admitting: Cardiology

## 2014-12-17 ENCOUNTER — Encounter (HOSPITAL_COMMUNITY)
Admission: RE | Admit: 2014-12-17 | Discharge: 2014-12-17 | Disposition: A | Discharge status: Home / Self Care | Payer: Self-pay | Source: Ambulatory Visit | Attending: Cardiology | Admitting: Cardiology

## 2014-12-17 NOTE — Progress Notes (Signed)
Patient has been noted to have some intermittent exertional blood pressure elevations at cardiac rehab maintenance. Will fax exercise flow sheets to Dr. Darcus Pester office for review.

## 2014-12-19 ENCOUNTER — Encounter (HOSPITAL_COMMUNITY)
Admission: RE | Admit: 2014-12-19 | Discharge: 2014-12-19 | Disposition: A | Discharge status: Home / Self Care | Payer: Self-pay | Source: Ambulatory Visit | Attending: Cardiology | Admitting: Cardiology

## 2014-12-21 ENCOUNTER — Encounter (HOSPITAL_COMMUNITY)
Admission: RE | Admit: 2014-12-21 | Discharge: 2014-12-21 | Disposition: A | Discharge status: Home / Self Care | Payer: Self-pay | Source: Ambulatory Visit | Attending: Cardiology | Admitting: Cardiology

## 2014-12-21 DIAGNOSIS — Z951 Presence of aortocoronary bypass graft: Secondary | ICD-10-CM | POA: Insufficient documentation

## 2014-12-21 DIAGNOSIS — Z48812 Encounter for surgical aftercare following surgery on the circulatory system: Secondary | ICD-10-CM | POA: Insufficient documentation

## 2014-12-26 ENCOUNTER — Encounter (HOSPITAL_COMMUNITY)
Admission: RE | Admit: 2014-12-26 | Discharge: 2014-12-26 | Disposition: A | Discharge status: Home / Self Care | Payer: Self-pay | Source: Ambulatory Visit | Attending: Cardiology | Admitting: Cardiology

## 2014-12-28 ENCOUNTER — Encounter (HOSPITAL_COMMUNITY): Payer: Self-pay

## 2014-12-31 ENCOUNTER — Encounter (HOSPITAL_COMMUNITY): Payer: Self-pay

## 2015-01-02 ENCOUNTER — Encounter (HOSPITAL_COMMUNITY)
Admission: RE | Admit: 2015-01-02 | Discharge: 2015-01-02 | Disposition: A | Discharge status: Home / Self Care | Payer: Self-pay | Source: Ambulatory Visit | Attending: Cardiology | Admitting: Cardiology

## 2015-01-04 ENCOUNTER — Encounter (HOSPITAL_COMMUNITY)
Admission: RE | Admit: 2015-01-04 | Discharge: 2015-01-04 | Disposition: A | Discharge status: Home / Self Care | Payer: Self-pay | Source: Ambulatory Visit | Attending: Cardiology | Admitting: Cardiology

## 2015-01-07 ENCOUNTER — Encounter (HOSPITAL_COMMUNITY)
Admission: RE | Admit: 2015-01-07 | Discharge: 2015-01-07 | Disposition: A | Discharge status: Home / Self Care | Payer: Self-pay | Source: Ambulatory Visit | Attending: Cardiology | Admitting: Cardiology

## 2015-01-09 ENCOUNTER — Encounter (HOSPITAL_COMMUNITY)
Admission: RE | Admit: 2015-01-09 | Discharge: 2015-01-09 | Disposition: A | Discharge status: Home / Self Care | Payer: Self-pay | Source: Ambulatory Visit | Attending: Cardiology | Admitting: Cardiology

## 2015-01-11 ENCOUNTER — Encounter (HOSPITAL_COMMUNITY)
Admission: RE | Admit: 2015-01-11 | Discharge: 2015-01-11 | Disposition: A | Discharge status: Home / Self Care | Payer: Self-pay | Source: Ambulatory Visit | Attending: Cardiology | Admitting: Cardiology

## 2015-01-14 ENCOUNTER — Encounter (HOSPITAL_COMMUNITY)
Admission: RE | Admit: 2015-01-14 | Discharge: 2015-01-14 | Disposition: A | Discharge status: Home / Self Care | Payer: Self-pay | Source: Ambulatory Visit | Attending: Cardiology | Admitting: Cardiology

## 2015-01-16 ENCOUNTER — Telehealth: Payer: Self-pay | Admitting: Cardiology

## 2015-01-16 ENCOUNTER — Encounter (HOSPITAL_COMMUNITY)
Admission: RE | Admit: 2015-01-16 | Discharge: 2015-01-16 | Disposition: A | Discharge status: Home / Self Care | Payer: Self-pay | Source: Ambulatory Visit | Attending: Cardiology | Admitting: Cardiology

## 2015-01-16 NOTE — Telephone Encounter (Signed)
Explained to patient that the physician will probably want to keep him on Xarelto  Through December

## 2015-01-16 NOTE — Telephone Encounter (Signed)
Pt wants to know if he can stop taking Gabriel Rainwater thought he was suppose to be on it for a short period.

## 2015-01-18 ENCOUNTER — Encounter (HOSPITAL_COMMUNITY)
Admission: RE | Admit: 2015-01-18 | Discharge: 2015-01-18 | Disposition: A | Discharge status: Home / Self Care | Payer: Self-pay | Source: Ambulatory Visit | Attending: Cardiology | Admitting: Cardiology

## 2015-01-21 ENCOUNTER — Encounter (HOSPITAL_COMMUNITY)
Admission: RE | Admit: 2015-01-21 | Discharge: 2015-01-21 | Disposition: A | Discharge status: Home / Self Care | Payer: Self-pay | Source: Ambulatory Visit | Attending: Cardiology | Admitting: Cardiology

## 2015-01-21 DIAGNOSIS — Z951 Presence of aortocoronary bypass graft: Secondary | ICD-10-CM | POA: Insufficient documentation

## 2015-01-21 DIAGNOSIS — Z48812 Encounter for surgical aftercare following surgery on the circulatory system: Secondary | ICD-10-CM | POA: Insufficient documentation

## 2015-01-23 ENCOUNTER — Encounter (HOSPITAL_COMMUNITY)
Admission: RE | Admit: 2015-01-23 | Discharge: 2015-01-23 | Disposition: A | Discharge status: Home / Self Care | Payer: Self-pay | Source: Ambulatory Visit | Attending: Cardiology | Admitting: Cardiology

## 2015-01-25 ENCOUNTER — Encounter (HOSPITAL_COMMUNITY): Payer: Self-pay

## 2015-01-28 ENCOUNTER — Encounter (HOSPITAL_COMMUNITY)
Admission: RE | Admit: 2015-01-28 | Discharge: 2015-01-28 | Disposition: A | Discharge status: Home / Self Care | Payer: Self-pay | Source: Ambulatory Visit | Attending: Cardiology | Admitting: Cardiology

## 2015-01-30 ENCOUNTER — Encounter (HOSPITAL_COMMUNITY)
Admission: RE | Admit: 2015-01-30 | Discharge: 2015-01-30 | Disposition: A | Discharge status: Home / Self Care | Payer: Self-pay | Source: Ambulatory Visit | Attending: Cardiology | Admitting: Cardiology

## 2015-02-01 ENCOUNTER — Encounter (HOSPITAL_COMMUNITY): Payer: Self-pay

## 2015-02-04 ENCOUNTER — Encounter (HOSPITAL_COMMUNITY)
Admission: RE | Admit: 2015-02-04 | Discharge: 2015-02-04 | Disposition: A | Discharge status: Home / Self Care | Payer: Self-pay | Source: Ambulatory Visit | Attending: Cardiology | Admitting: Cardiology

## 2015-02-06 ENCOUNTER — Encounter (HOSPITAL_COMMUNITY)
Admission: RE | Admit: 2015-02-06 | Discharge: 2015-02-06 | Disposition: A | Discharge status: Home / Self Care | Payer: Self-pay | Source: Ambulatory Visit | Attending: Cardiology | Admitting: Cardiology

## 2015-02-08 ENCOUNTER — Encounter (HOSPITAL_COMMUNITY): Payer: Self-pay

## 2015-02-11 ENCOUNTER — Encounter (HOSPITAL_COMMUNITY)
Admission: RE | Admit: 2015-02-11 | Discharge: 2015-02-11 | Disposition: A | Discharge status: Home / Self Care | Payer: Self-pay | Source: Ambulatory Visit | Attending: Cardiology | Admitting: Cardiology

## 2015-02-13 ENCOUNTER — Encounter (HOSPITAL_COMMUNITY)
Admission: RE | Admit: 2015-02-13 | Discharge: 2015-02-13 | Disposition: A | Discharge status: Home / Self Care | Payer: Self-pay | Source: Ambulatory Visit | Attending: Cardiology | Admitting: Cardiology

## 2015-02-15 ENCOUNTER — Telehealth: Payer: Self-pay | Admitting: Cardiology

## 2015-02-15 ENCOUNTER — Encounter (HOSPITAL_COMMUNITY)
Admission: RE | Admit: 2015-02-15 | Discharge: 2015-02-15 | Disposition: A | Discharge status: Home / Self Care | Payer: Self-pay | Source: Ambulatory Visit | Attending: Cardiology | Admitting: Cardiology

## 2015-02-15 DIAGNOSIS — Z951 Presence of aortocoronary bypass graft: Secondary | ICD-10-CM

## 2015-02-15 DIAGNOSIS — I2699 Other pulmonary embolism without acute cor pulmonale: Secondary | ICD-10-CM

## 2015-02-15 NOTE — Telephone Encounter (Signed)
Spoke to patient Patient states he would like to stop taking Xarelto.  He states he is tired and has symptoms . He states a doctor told him while he was in the hospital . He would be on the medications for 6 months. Informed patient that the xarelto is probably not causing tiredness. Patient states he still would to stop medication. Wanted to know it need to be weaned or stop abruptly. Patient aware, will defer to Dr Ellyn Hack.

## 2015-02-15 NOTE — Telephone Encounter (Signed)
Pt called in stating that he would like to wean himself off of Xarelto and would like to speak with Dr. Ellyn Hack about this. Please f/u with the pt  Thanks

## 2015-02-18 ENCOUNTER — Encounter (HOSPITAL_COMMUNITY)
Admission: RE | Admit: 2015-02-18 | Discharge: 2015-02-18 | Disposition: A | Discharge status: Home / Self Care | Payer: Self-pay | Source: Ambulatory Visit | Attending: Cardiology | Admitting: Cardiology

## 2015-02-20 ENCOUNTER — Encounter (HOSPITAL_COMMUNITY): Payer: Self-pay

## 2015-02-20 DIAGNOSIS — Z951 Presence of aortocoronary bypass graft: Secondary | ICD-10-CM | POA: Insufficient documentation

## 2015-02-20 DIAGNOSIS — Z48812 Encounter for surgical aftercare following surgery on the circulatory system: Secondary | ICD-10-CM | POA: Insufficient documentation

## 2015-02-22 ENCOUNTER — Encounter (HOSPITAL_COMMUNITY)
Admission: RE | Admit: 2015-02-22 | Discharge: 2015-02-22 | Disposition: A | Discharge status: Home / Self Care | Payer: Self-pay | Source: Ambulatory Visit | Attending: Cardiology | Admitting: Cardiology

## 2015-02-24 NOTE — Telephone Encounter (Signed)
Plan is to check Echo to reassess RV function & pressures this winter- we can do in November. If this looks improved, we can probably stop Xarelto. I agree - this probably is not making him feel tired.  Leonie Man, MD

## 2015-02-25 ENCOUNTER — Encounter (HOSPITAL_COMMUNITY)
Admission: RE | Admit: 2015-02-25 | Discharge: 2015-02-25 | Disposition: A | Discharge status: Home / Self Care | Payer: Self-pay | Source: Ambulatory Visit | Attending: Cardiology | Admitting: Cardiology

## 2015-02-25 NOTE — Telephone Encounter (Signed)
Spoke to patient. ECHO is needed to reassess. Verbalized understanding Ordered placed.

## 2015-02-25 NOTE — Telephone Encounter (Signed)
NO ANSWER ,WILL TRY AGAIN

## 2015-02-27 ENCOUNTER — Encounter (HOSPITAL_COMMUNITY)
Admission: RE | Admit: 2015-02-27 | Discharge: 2015-02-27 | Disposition: A | Discharge status: Home / Self Care | Payer: Self-pay | Source: Ambulatory Visit | Attending: Cardiology | Admitting: Cardiology

## 2015-03-01 ENCOUNTER — Encounter (HOSPITAL_COMMUNITY)
Admission: RE | Admit: 2015-03-01 | Discharge: 2015-03-01 | Disposition: A | Discharge status: Home / Self Care | Payer: Self-pay | Source: Ambulatory Visit | Attending: Cardiology | Admitting: Cardiology

## 2015-03-04 ENCOUNTER — Encounter (HOSPITAL_COMMUNITY)
Admission: RE | Admit: 2015-03-04 | Discharge: 2015-03-04 | Disposition: A | Discharge status: Home / Self Care | Payer: Self-pay | Source: Ambulatory Visit | Attending: Cardiology | Admitting: Cardiology

## 2015-03-05 ENCOUNTER — Other Ambulatory Visit: Payer: Self-pay

## 2015-03-05 ENCOUNTER — Ambulatory Visit (HOSPITAL_COMMUNITY): Payer: Medicare HMO | Attending: Cardiology

## 2015-03-05 DIAGNOSIS — I10 Essential (primary) hypertension: Secondary | ICD-10-CM | POA: Diagnosis not present

## 2015-03-05 DIAGNOSIS — E785 Hyperlipidemia, unspecified: Secondary | ICD-10-CM | POA: Diagnosis not present

## 2015-03-05 DIAGNOSIS — I2699 Other pulmonary embolism without acute cor pulmonale: Secondary | ICD-10-CM

## 2015-03-05 DIAGNOSIS — I340 Nonrheumatic mitral (valve) insufficiency: Secondary | ICD-10-CM | POA: Diagnosis not present

## 2015-03-05 DIAGNOSIS — I517 Cardiomegaly: Secondary | ICD-10-CM | POA: Insufficient documentation

## 2015-03-05 DIAGNOSIS — I77810 Thoracic aortic ectasia: Secondary | ICD-10-CM | POA: Insufficient documentation

## 2015-03-05 DIAGNOSIS — Z8249 Family history of ischemic heart disease and other diseases of the circulatory system: Secondary | ICD-10-CM | POA: Diagnosis not present

## 2015-03-05 DIAGNOSIS — I071 Rheumatic tricuspid insufficiency: Secondary | ICD-10-CM | POA: Insufficient documentation

## 2015-03-05 DIAGNOSIS — I5189 Other ill-defined heart diseases: Secondary | ICD-10-CM | POA: Insufficient documentation

## 2015-03-05 DIAGNOSIS — Z951 Presence of aortocoronary bypass graft: Secondary | ICD-10-CM | POA: Diagnosis not present

## 2015-03-05 DIAGNOSIS — I2510 Atherosclerotic heart disease of native coronary artery without angina pectoris: Secondary | ICD-10-CM | POA: Insufficient documentation

## 2015-03-05 DIAGNOSIS — Z87891 Personal history of nicotine dependence: Secondary | ICD-10-CM | POA: Diagnosis not present

## 2015-03-06 ENCOUNTER — Encounter (HOSPITAL_COMMUNITY)
Admission: RE | Admit: 2015-03-06 | Discharge: 2015-03-06 | Disposition: A | Discharge status: Home / Self Care | Payer: Self-pay | Source: Ambulatory Visit | Attending: Cardiology | Admitting: Cardiology

## 2015-03-07 ENCOUNTER — Telehealth: Payer: Self-pay | Admitting: *Deleted

## 2015-03-07 NOTE — Telephone Encounter (Signed)
Spoke to patient. Result given . Verbalized understanding Patient aware to continue xarelto until dec 31 ,2016 Route to [place] MD

## 2015-03-07 NOTE — Progress Notes (Signed)
Quick Note:  Echo results: Good news: Essentially normal echocardiogram and normal pump function and normal valve function. No residual signs to suggest heart attack.. EF: 55-60%. No regional wall motion abnormalities  Great news.  Robert Boyle, [name], MD  Pls forward to Chesley Noon, MD    ______

## 2015-03-07 NOTE — Telephone Encounter (Signed)
-----   Message from Leonie Man, MD sent at 03/07/2015  2:49 PM EST ----- Echo results: Good news: Essentially normal echocardiogram and normal pump function and normal valve function.  No residual signs to suggest heart attack.. EF: 55-60%. No regional wall motion abnormalities  Great news.  HARDING, [name], MD  Pls forward to Chesley Noon, MD

## 2015-03-08 ENCOUNTER — Encounter (HOSPITAL_COMMUNITY)
Admission: RE | Admit: 2015-03-08 | Discharge: 2015-03-08 | Disposition: A | Discharge status: Home / Self Care | Payer: Self-pay | Source: Ambulatory Visit | Attending: Cardiology | Admitting: Cardiology

## 2015-03-11 ENCOUNTER — Encounter (HOSPITAL_COMMUNITY)
Admission: RE | Admit: 2015-03-11 | Discharge: 2015-03-11 | Disposition: A | Discharge status: Home / Self Care | Payer: Self-pay | Source: Ambulatory Visit | Attending: Cardiology | Admitting: Cardiology

## 2015-03-13 ENCOUNTER — Encounter (HOSPITAL_COMMUNITY)
Admission: RE | Admit: 2015-03-13 | Discharge: 2015-03-13 | Disposition: A | Discharge status: Home / Self Care | Payer: Self-pay | Source: Ambulatory Visit | Attending: Cardiology | Admitting: Cardiology

## 2015-03-18 ENCOUNTER — Encounter (HOSPITAL_COMMUNITY)
Admission: RE | Admit: 2015-03-18 | Discharge: 2015-03-18 | Disposition: A | Discharge status: Home / Self Care | Payer: Self-pay | Source: Ambulatory Visit | Attending: Cardiology | Admitting: Cardiology

## 2015-03-20 ENCOUNTER — Encounter (HOSPITAL_COMMUNITY)
Admission: RE | Admit: 2015-03-20 | Discharge: 2015-03-20 | Disposition: A | Discharge status: Home / Self Care | Payer: Self-pay | Source: Ambulatory Visit | Attending: Cardiology | Admitting: Cardiology

## 2015-03-22 ENCOUNTER — Encounter (HOSPITAL_COMMUNITY)
Admission: RE | Admit: 2015-03-22 | Discharge: 2015-03-22 | Disposition: A | Discharge status: Home / Self Care | Payer: Self-pay | Source: Ambulatory Visit | Attending: Cardiology | Admitting: Cardiology

## 2015-03-22 ENCOUNTER — Telehealth: Payer: Self-pay | Admitting: Cardiology

## 2015-03-22 DIAGNOSIS — Z48812 Encounter for surgical aftercare following surgery on the circulatory system: Secondary | ICD-10-CM | POA: Insufficient documentation

## 2015-03-22 DIAGNOSIS — Z951 Presence of aortocoronary bypass graft: Secondary | ICD-10-CM | POA: Insufficient documentation

## 2015-03-22 MED ORDER — LOSARTAN POTASSIUM 25 MG PO TABS: 25.0000 mg | ORAL_TABLET | Freq: Every day | ORAL | Status: DC

## 2015-03-22 NOTE — Telephone Encounter (Addendum)
Spoke to [place].   RN CONTACTED PATIENT- INFORMED HIM OF THE NEW MEDICATION ADDED INFORMATION GIVEN 30 DAY SUPPLY  X 6  REFILLS  E-SENT TO [facility]  WILL WAIT UNTIL SCHEDULE APPOINTMENT TO SEE IF 90 DAY SUPPLY IS NEEDED. PATIENT VERBALIZED UNDERSTANDING

## 2015-03-22 NOTE — Addendum Note (Signed)
Addended by: Raiford Simmonds on: 03/22/2015 04:28 PM   Modules accepted: Orders

## 2015-03-22 NOTE — Progress Notes (Signed)
Ed's blood pressures have been up the last two exercise sessions at cardiac rehab. Patient reported having soreness in his hands from arthritis other wise no other complaints voiced. Trixie Dredge RN called and notified. Will fax exercise flow sheets to Dr. Darcus Pester office for review. Will continue to monitor the patient throughout  the program.

## 2015-03-22 NOTE — Telephone Encounter (Signed)
Spoke to [employer] patient blood pressure has been elevated for the last 2 times at the maintenance cardiac rehab 03/22/15  158/80- 160/? 03/20/15  150/90 ,150/80,142/90 No issues voiced  - other than using celebrex for arthritis RN  inform Verdis Frederickson to send recording for review with Dr Ellyn Hack. Patient has an appointment at  The end of  month  04/17/15.

## 2015-03-22 NOTE — Telephone Encounter (Signed)
It would appear that now that his blood pressure is back up to what it was prior to his CABG.  Would start losartan 25 mg daily.  Leonie Man, M.D., M.S. Interventional Cardiologist   Pager # [phone]

## 2015-03-25 ENCOUNTER — Encounter (HOSPITAL_COMMUNITY)
Admission: RE | Admit: 2015-03-25 | Discharge: 2015-03-25 | Disposition: A | Discharge status: Home / Self Care | Payer: Self-pay | Source: Ambulatory Visit | Attending: Cardiology | Admitting: Cardiology

## 2015-03-27 ENCOUNTER — Encounter (HOSPITAL_COMMUNITY): Payer: Self-pay

## 2015-03-27 IMAGING — CT CT ANGIO CHEST
2 of 9 series · 18 of 46 positions shown · IV contrast (Omni 300)
Comparison: Chest radiograph performed earlier today at [DATE] a.m.

CLINICAL DATA: Acute onset of generalized chest pain and back pain.
Shortness of breath. Recent CABG. Initial encounter.

EXAM:
CT ANGIOGRAPHY CHEST WITH CONTRAST
TECHNIQUE: Multidetector CT imaging of the chest was performed using the
standard protocol during bolus administration of intravenous
contrast. Multiplanar CT image reconstructions and MIPs were
obtained to evaluate the vascular anatomy.
CONTRAST:  75mL OMNIPAQUE IOHEXOL 350 MG/ML SOLN

[Series 5: thins · axial · 0.72mm/px · z∈[-780,-485]mm · 15 of 333 slices shown]
[im 19/333  lung]
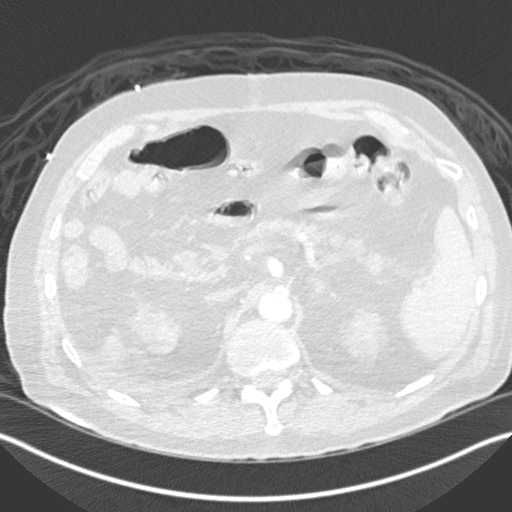
[im 37/333  soft-tissue]
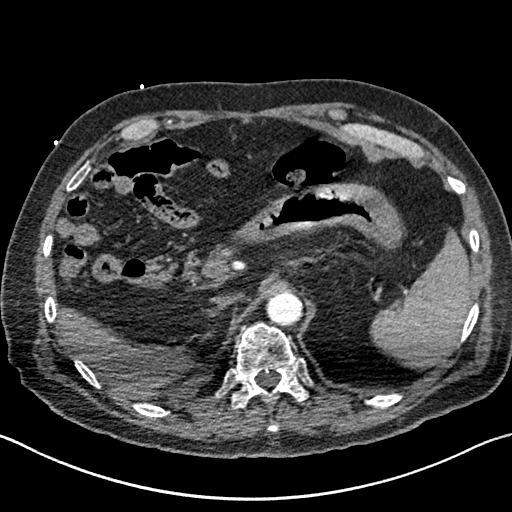
[im 56/333  lung]
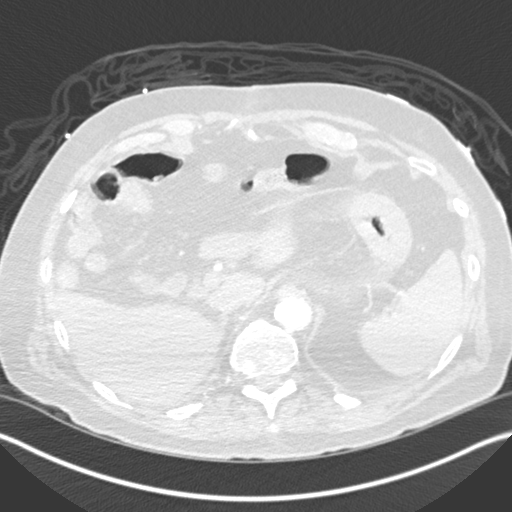
[im 74/333  soft-tissue]
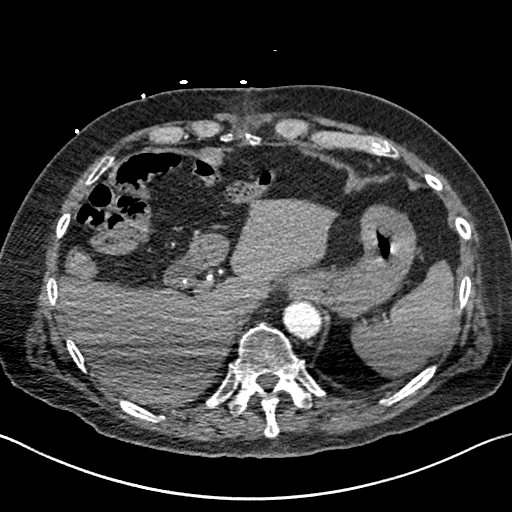
[im 111/333  lung]
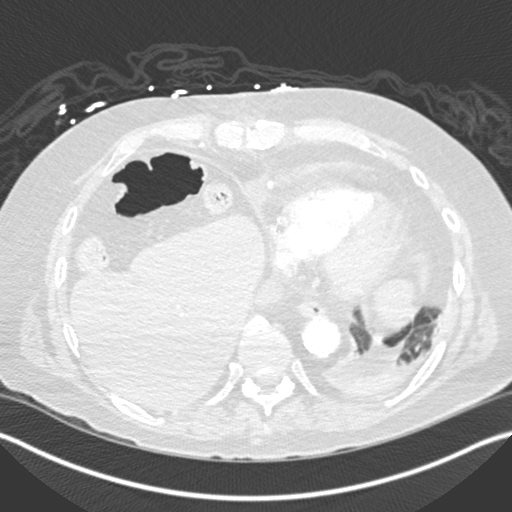
[im 130/333  soft-tissue]
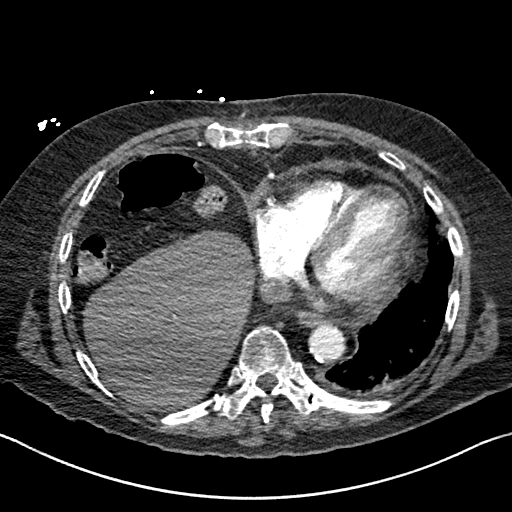
[im 148/333  lung]
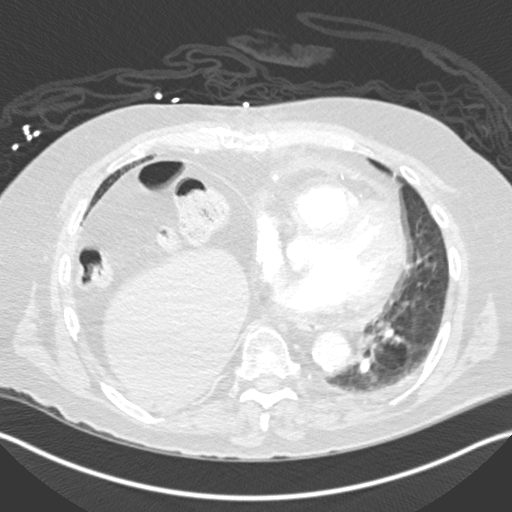
[im 167/333  soft-tissue]
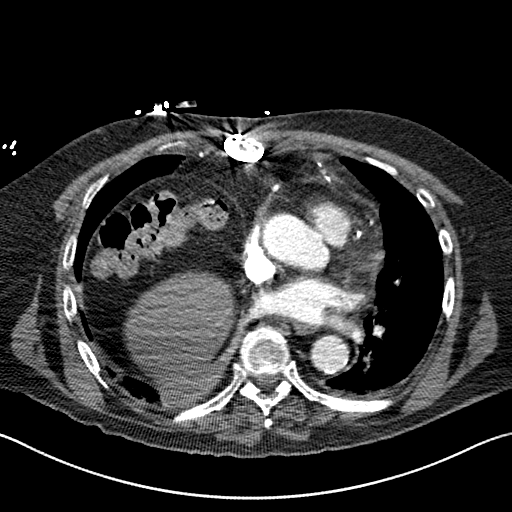
[im 185/333  lung]
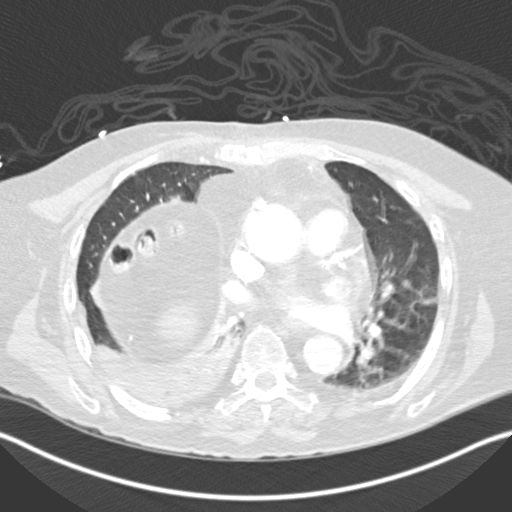
[im 203/333  soft-tissue]
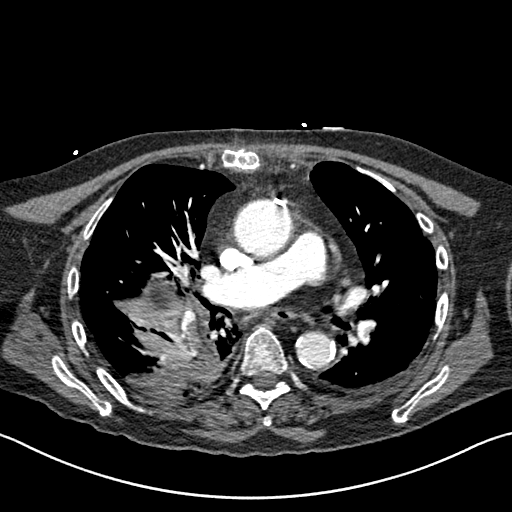
[im 222/333  lung]
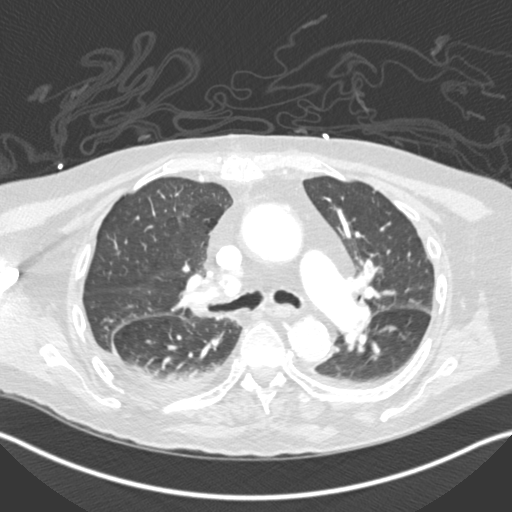
[im 259/333  soft-tissue]
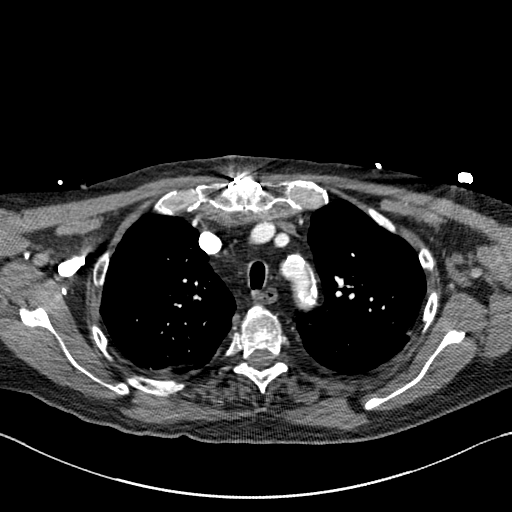
[im 277/333  lung]
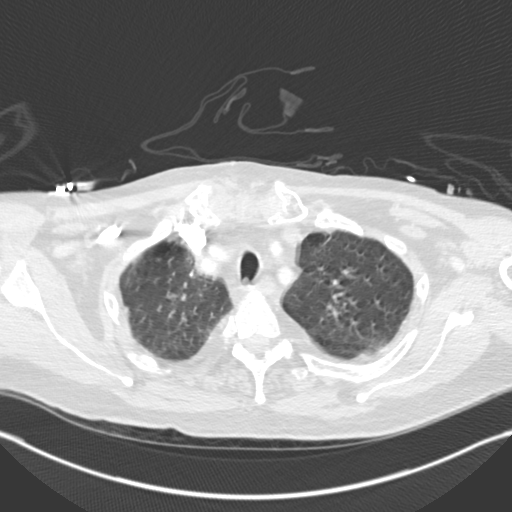
[im 296/333  soft-tissue]
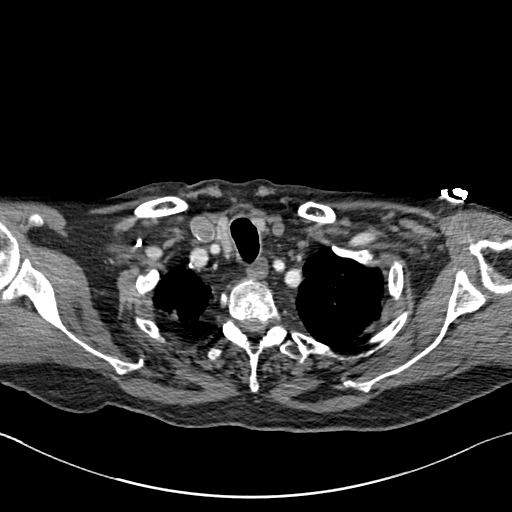
[im 314/333  lung]
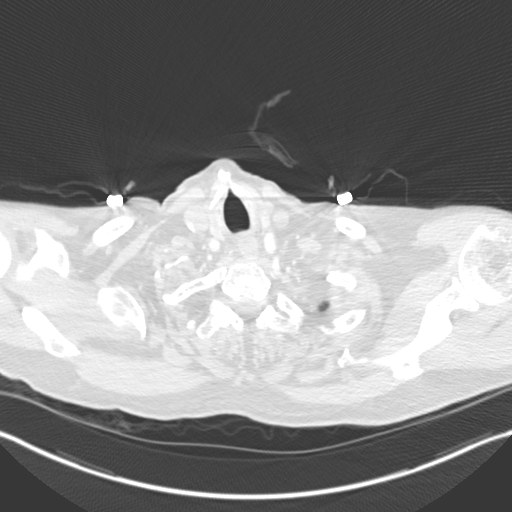

[Series 7: coronal mpr · coronal · 0.65mm/px · 3 of 115 slices shown]
[im 29/115  soft-tissue]
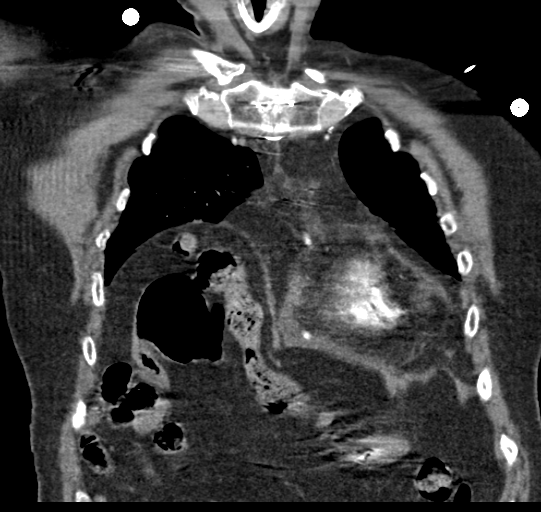
[im 58/115  soft-tissue]
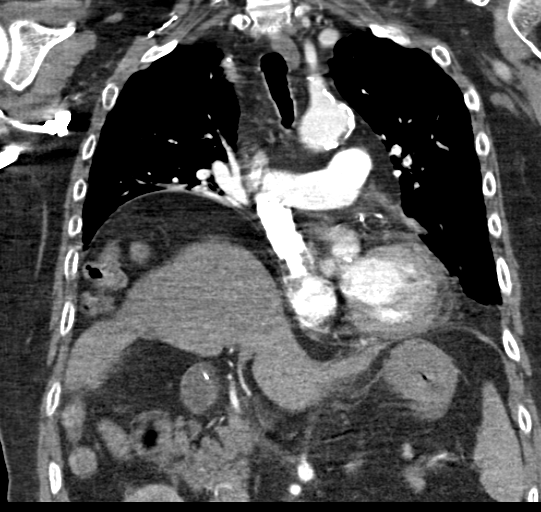
[im 86/115  soft-tissue]
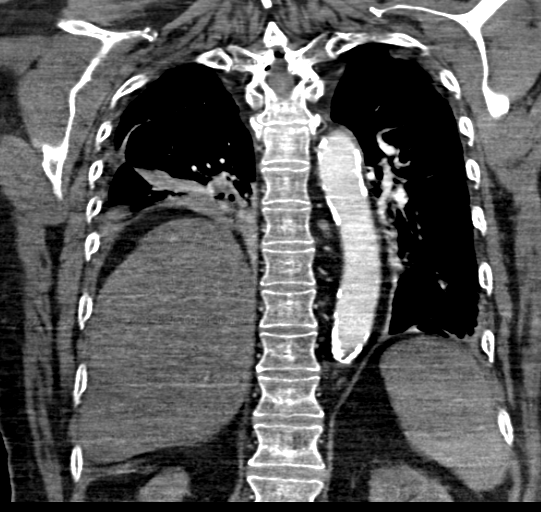

[18 of 46 positions shown; findings below may reference images not displayed]

FINDINGS: There is pulmonary embolus within both main pulmonary arteries,
tracking into the pulmonary arteries to all lobes of both lungs. The
RV/LV ratio of 1.4 corresponds to right heart strain and concern for
submassive pulmonary embolus.

Focal right basilar airspace opacification is most compatible with
pulmonary infarct. Scattered blebs are noted at the lung apices.
Trace left basilar pleural fluid is noted, with mild associated
atelectasis. There is no evidence of pneumothorax. No masses are
identified; no abnormal focal contrast enhancement is seen.

A trace pericardial effusion is noted. Diffuse coronary artery
calcification is seen. The patient is status post median sternotomy.
No mediastinal lymphadenopathy is seen. Scattered calcific
atherosclerotic disease is noted along the aortic arch and proximal
great vessels. There is mild dilatation of the ascending thoracic
aorta, measuring up to 3.8 cm, without evidence of aneurysmal
dilatation. No axillary lymphadenopathy is seen. The visualized
portions of the thyroid gland are unremarkable in appearance.

The visualized portions of the liver and spleen are unremarkable.
The visualized portions of the pancreas, stomach, adrenal glands and
kidneys are within normal limits. Nonspecific perinephric stranding
is noted bilaterally. The patient is status post cholecystectomy,
with clips noted at the gallbladder fossa.

No acute osseous abnormalities are seen.

Review of the MIP images confirms the above findings.
IMPRESSION: 1. Pulmonary embolus within both main pulmonary arteries, tracking
into the pulmonary arteries to all lobes of both lungs. RV/LV ratio
of 1.4 corresponds to right heart strain and concern for submassive
pulmonary embolus.
2. Focal right basilar pulmonary infarct noted.
3. Trace left basilar pleural fluid, with mild associated
atelectasis.
4. Scattered blebs at the lung apices.
5. Trace pericardial effusion seen.
6. Diffuse coronary artery calcification noted.
7. Mild dilatation of the ascending thoracic aorta to 3.8 cm,
without evidence of aneurysmal dilatation.

Critical Value/emergent results were called by telephone at the time
of interpretation on 04/14/2014 at [DATE] to Dr. ZNAIDI CHANEL, who
verbally acknowledged these results.

## 2015-03-29 ENCOUNTER — Encounter (HOSPITAL_COMMUNITY): Payer: Self-pay

## 2015-04-01 ENCOUNTER — Encounter (HOSPITAL_COMMUNITY)
Admission: RE | Admit: 2015-04-01 | Discharge: 2015-04-01 | Disposition: A | Discharge status: Home / Self Care | Payer: Self-pay | Source: Ambulatory Visit | Attending: Cardiology | Admitting: Cardiology

## 2015-04-02 ENCOUNTER — Encounter: Payer: Self-pay | Admitting: Cardiology

## 2015-04-03 ENCOUNTER — Encounter (HOSPITAL_COMMUNITY): Payer: Self-pay

## 2015-04-05 ENCOUNTER — Encounter (HOSPITAL_COMMUNITY): Payer: Self-pay

## 2015-04-08 ENCOUNTER — Encounter (HOSPITAL_COMMUNITY)
Admission: RE | Admit: 2015-04-08 | Discharge: 2015-04-08 | Disposition: A | Discharge status: Home / Self Care | Payer: Self-pay | Source: Ambulatory Visit | Attending: Cardiology | Admitting: Cardiology

## 2015-04-10 ENCOUNTER — Encounter (HOSPITAL_COMMUNITY)
Admission: RE | Admit: 2015-04-10 | Discharge: 2015-04-10 | Disposition: A | Discharge status: Home / Self Care | Payer: Self-pay | Source: Ambulatory Visit | Attending: Cardiology | Admitting: Cardiology

## 2015-04-10 NOTE — Progress Notes (Signed)
Ed's blood pressure at rest today was intially172/100. Pt rested and recheck BP was 152/98. Pt had recently been started on Losartan 25 mg daily but states that he stopped taking it a couple of weeks ago because it made him feel dizzy. Pt has an appointment with Dr. Ellyn Hack on 04/17/15 and will hold exercise until after that appointment. Pt has been instructed to contact Dr. Allison Quarry office to inquire whether or not to restart the Losartan prior to his follow-up appointment next Wednesday. Exit BP was 162/95, pt asymptomatic.

## 2015-04-12 ENCOUNTER — Encounter (HOSPITAL_COMMUNITY): Payer: Self-pay

## 2015-04-12 IMAGING — CR DG CHEST 2V
2 series · 2 of 2 positions shown · non-contrast
Comparison: PA and lateral chest x-ray dated [REDACTED] 4510.

CLINICAL DATA: Status post CABG 5 weeks ago; shortness of breath
today; chronic generalize weakness since surgery

EXAM:
CHEST  2 VIEW

[view not recorded (1 of 2)]
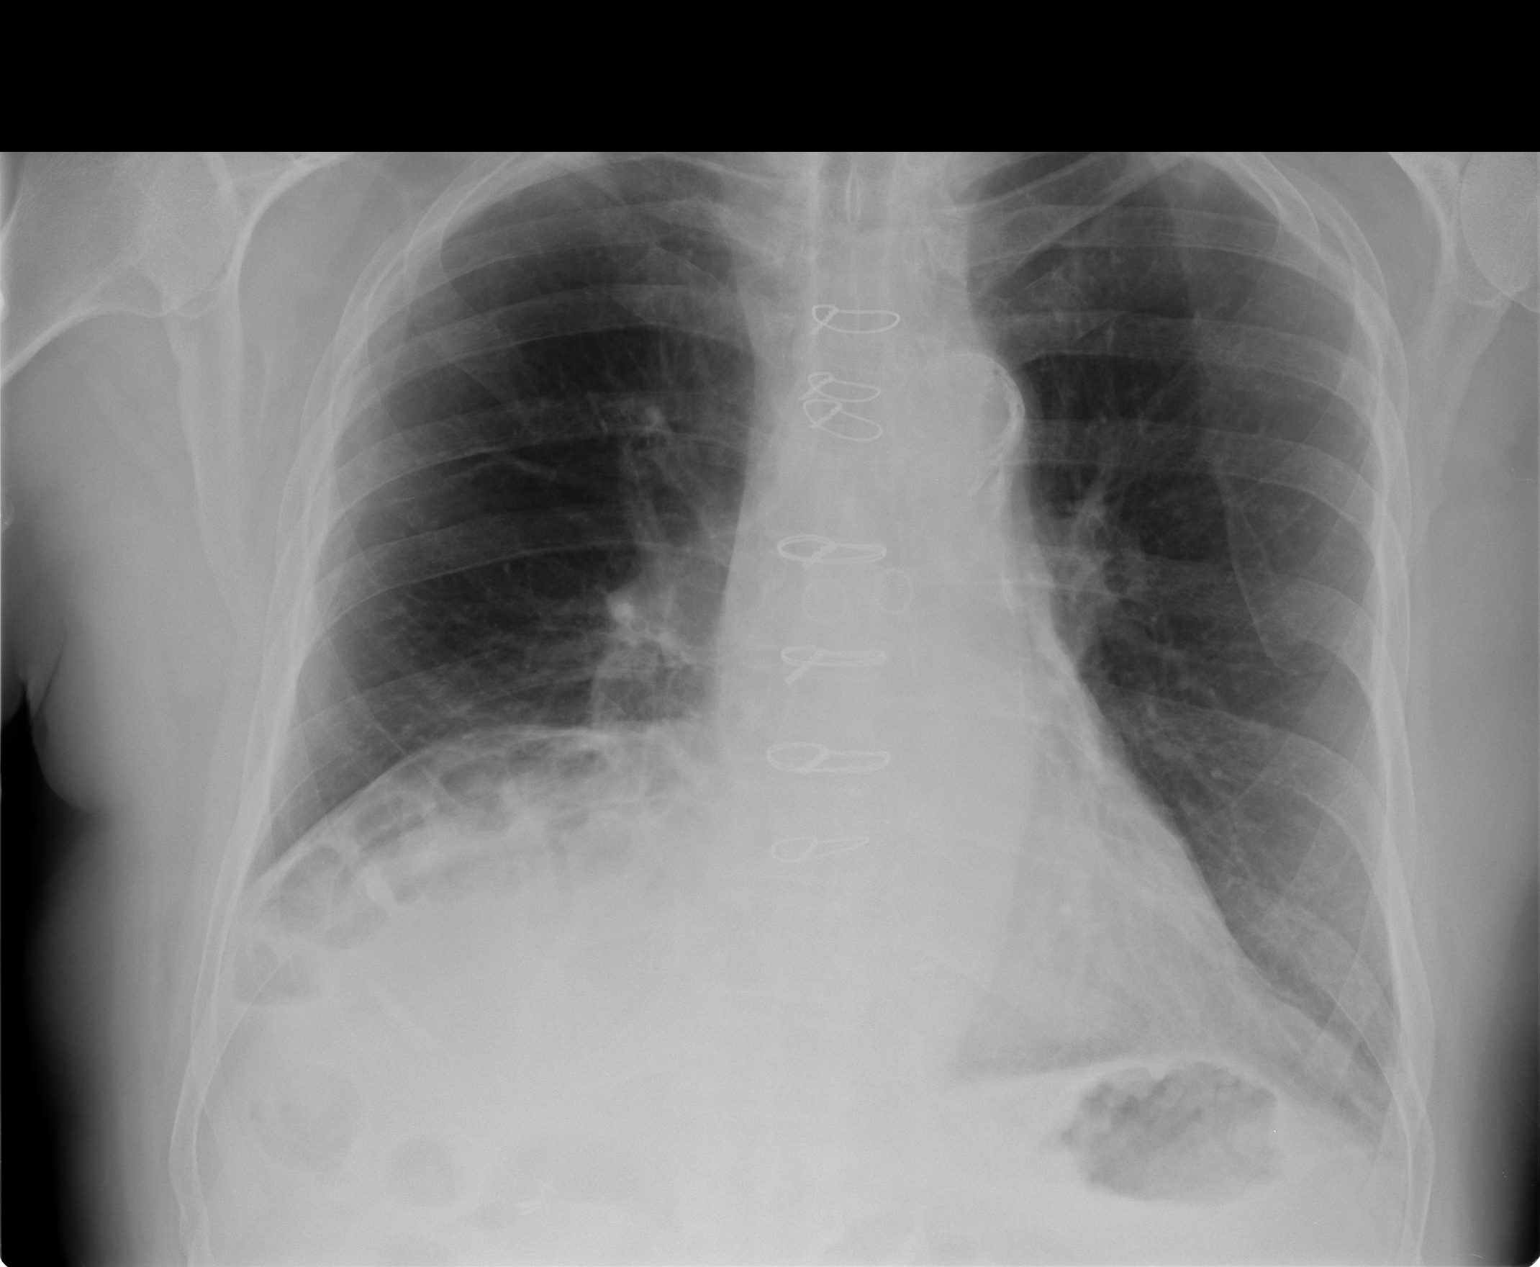

[view not recorded (2 of 2)]
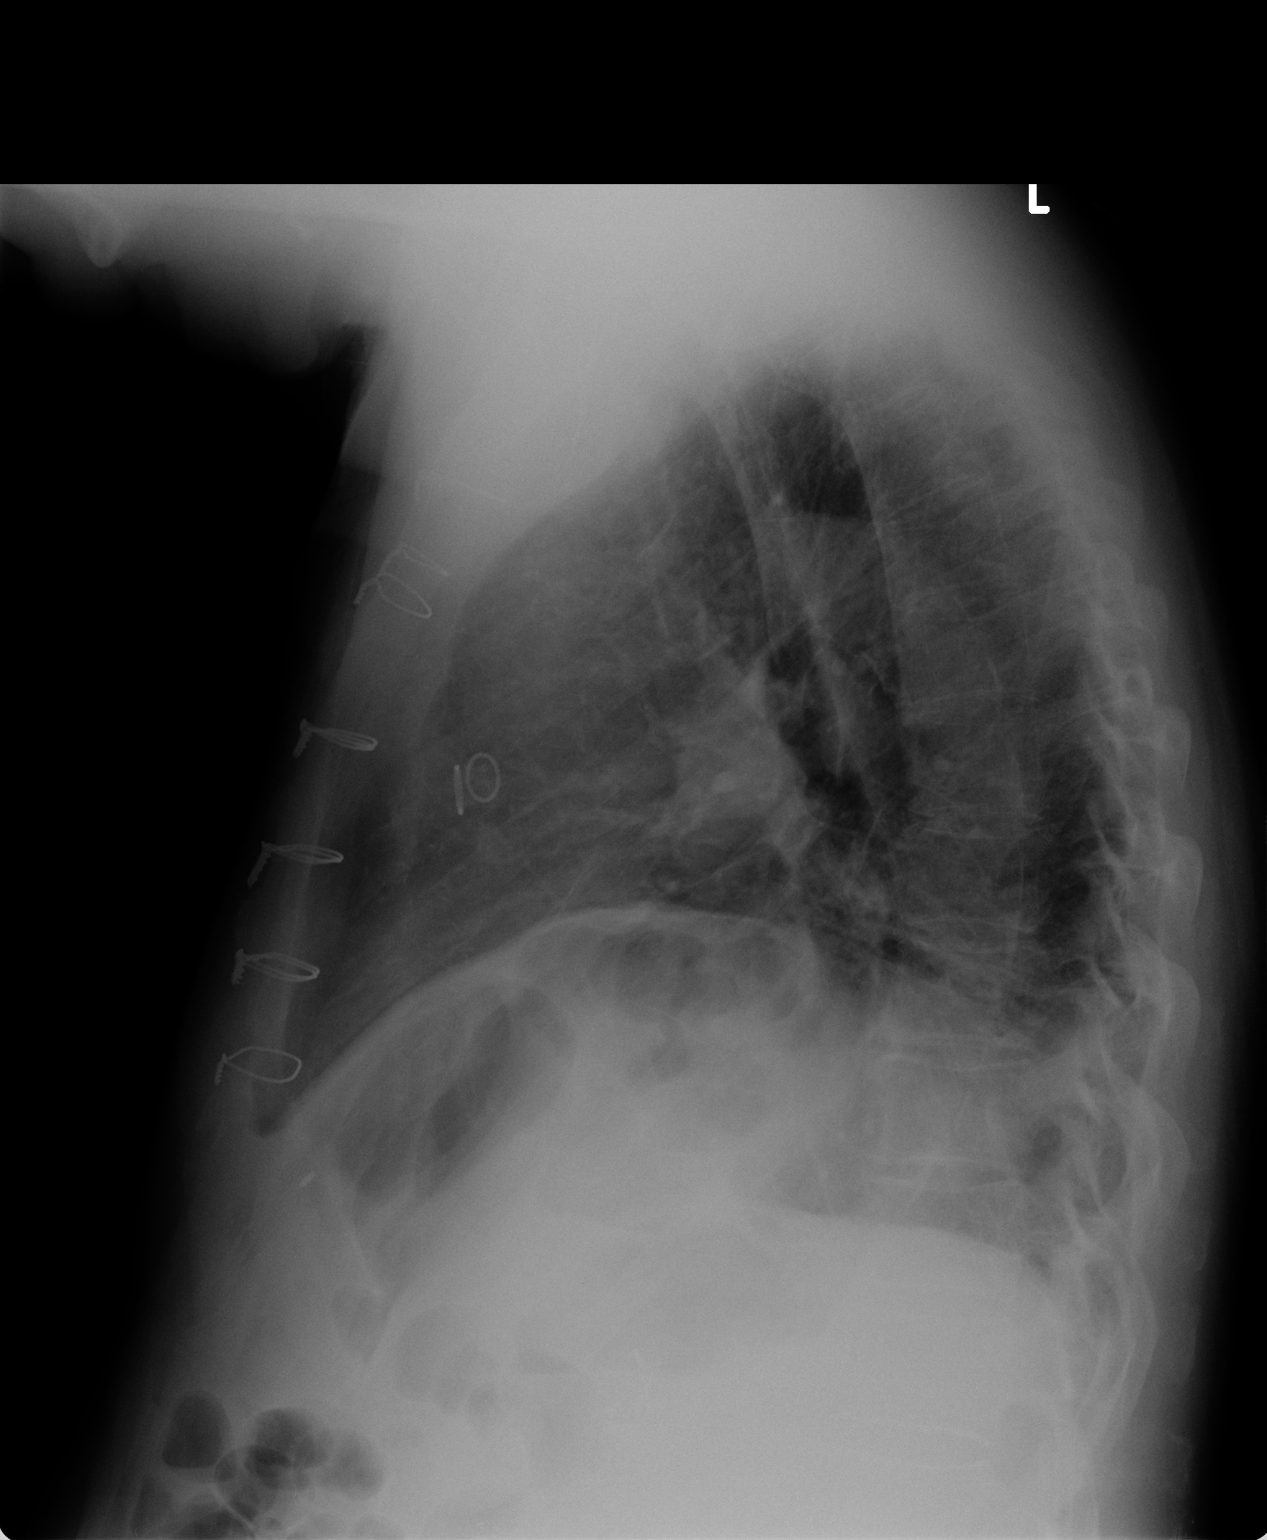

[2 of 2 positions shown; findings below may reference images not displayed]

FINDINGS: The left lung is mildly hyperinflated and clear. On the right there
is chronic elevation of the hemidiaphragm. There is subsegmental
atelectasis or early pneumonia in the posterior aspect of the right
lower lobe. There is stable apical pleural thickening bilaterally.
The cardiac silhouette is normal in size. There are 7 intact sternal
wires. The pulmonary vascularity is not engorged. There is no
pleural effusion or pneumothorax. There is mild tortuosity of the
descending thoracic aorta.
IMPRESSION: 1. Elevated right hemidiaphragm with interval development of
atelectasis or pneumonia posteriorly in the right lower lobe. The
left lung is clear.
2. There is no evidence of CHF.
3. There is no pleural effusion.

## 2015-04-17 ENCOUNTER — Ambulatory Visit (INDEPENDENT_AMBULATORY_CARE_PROVIDER_SITE_OTHER): Payer: Medicare HMO | Admitting: Podiatry

## 2015-04-17 ENCOUNTER — Encounter: Payer: Self-pay | Admitting: Cardiology

## 2015-04-17 ENCOUNTER — Encounter (HOSPITAL_COMMUNITY): Payer: Self-pay

## 2015-04-17 ENCOUNTER — Encounter: Payer: Self-pay | Admitting: Podiatry

## 2015-04-17 ENCOUNTER — Ambulatory Visit (INDEPENDENT_AMBULATORY_CARE_PROVIDER_SITE_OTHER): Payer: Medicare HMO | Admitting: Cardiology

## 2015-04-17 VITALS — BP 162/93 | HR 73 | Resp 14

## 2015-04-17 VITALS — BP 176/86 | HR 72 | Ht 73.0 in | Wt 198.0 lb

## 2015-04-17 DIAGNOSIS — L03031 Cellulitis of right toe: Secondary | ICD-10-CM | POA: Diagnosis not present

## 2015-04-17 DIAGNOSIS — I2510 Atherosclerotic heart disease of native coronary artery without angina pectoris: Secondary | ICD-10-CM | POA: Diagnosis not present

## 2015-04-17 DIAGNOSIS — L02611 Cutaneous abscess of right foot: Secondary | ICD-10-CM | POA: Diagnosis not present

## 2015-04-17 DIAGNOSIS — E785 Hyperlipidemia, unspecified: Secondary | ICD-10-CM

## 2015-04-17 DIAGNOSIS — I2699 Other pulmonary embolism without acute cor pulmonale: Secondary | ICD-10-CM | POA: Diagnosis not present

## 2015-04-17 DIAGNOSIS — I10 Essential (primary) hypertension: Secondary | ICD-10-CM

## 2015-04-17 MED ORDER — CEPHALEXIN 500 MG PO CAPS: 500.0000 mg | ORAL_CAPSULE | Freq: Four times a day (QID) | ORAL | Status: DC

## 2015-04-17 NOTE — Patient Instructions (Signed)
Restart losartan -take 1/2 tablet at night , For 2 weeks then increase to a full tablet.  Call office if new prescription is needed for losartan..  Your physician wants you to follow-up in 6 months Dr Ellyn Hack.  You will receive a reminder letter in the mail two months in advance. If you don't receive a letter, please call our office to schedule the follow-up appointment.  If you need a refill on your cardiac medications before your next appointment, please call your pharmacy.

## 2015-04-17 NOTE — Progress Notes (Signed)
PCP: Chesley Noon, MD  Clinic Note: Chief Complaint  Patient presents with  . Follow-up    NO COMPLAINTS.  Marland Kitchen Coronary Artery Disease    HPI: Robert Boyle is a 79 y.o. male with a PMH below who presents today for 6 month f/u for CAD-CABG.  He is a former patient of Dr. Chase Picket cardiac history dates back to 2000:  2000:  BMS in D1  2001: PTCA of the stent restenosis.   He did well until the fall 2015 when he began noticing worsening exertional dyspnea and eventually developed chest heaviness and pressure concerning for crescendo angina.   He had a Myoview stress test showed inferior-inferolateral ischemia in November 15 --> cardiac catheterization w/ MV CAD --> referred for CABG x 5  His postop course was complicated right remission several weeks later with bilateral acute pulmonary emboli. It took him longer than usual to recover and has been on anticoagulation for the PE.    He has been struggling to recover from both episodes.  He recently graduated from cardiac rehabilitation and has started the maintenance program. He basically feels comfortable with this level of exercise being monitored. He is starting his energy back and feeling more like himself.  He was last seen in June 2016 - doing well.  Plan was to continue Xarelto x 1 yr (ending now) to complete treatment of his PE. PCP converted from Atorvastatin to Crestor - tolerating well.  Reviewed Studies:   2 D Echo Mar 05, 2015: EF 55-60%, Gr 1 DD.  Mildly dilated Ascending Aorta; RV now back to normal  Carotid & UE Arteria [place]:  Mild Bilateral fibrous plaque in ICAs, normal Vertebral flow, No SCA stenosis  Interval History: Robert Boyle comes in today looking about as good as I seen him in over a year. He is continues to note that his energy level is bouncing back. Mostly having "good" days now - no more bad days.  Less noticeable fatigue.  Starting to have HTN issues --> we recently started him back on  Losartan & he has noted that he has been feeling a bit dizzy off & on since starting -> so he stopped it.   From a cardiac standpoint, his review this symptoms is as follows: No chest pain or shortness of breath with rest or exertion.  No PND, orthopnea or edema.  No palpitations, lightheadedness, weakness or syncope/near syncope, or TIA/amaurosis fugax symptoms.  -- orthostatic dizziness has definitely improved since stopping the Cozaar. No claudication.  Past Medical History  Diagnosis Date  . CAD S/P percutaneous coronary angioplasty 2000, 2001, 03/2014    a) 2000: BMS to D1 75% stenosis (~2.5 mm x 18 mm AVE BMS); b) 2001 ISR of D1 stent - PTCA w/ 2.5 mm Balloon;; c) Myoview 11/'15: Intermediate Risk, inferior-inferolateral ischemia --> Cath: 40% proximal LAD with 90% mid; 90% mid D1 and D2; 100% proximal RCA with left-to-right collaterals for both PL and PDA --> CABG    . 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 Acute pulmonary embolus ([place]) 04/13/14    CTA: Bilateral Main PAs; suggestion of increased RV strain & R basal pulmonary infarct,  Mild dilation of the ascending aorta 2.8 cm noted. --> On Xarelto  . Essential hypertension   . Hyperlipidemia with target LDL less than 70   . Prostate cancer ([place]) 05/23/13    gleason 4+3=7, vloume 34.32 cc; s/p Chemo-XRT  . History of radiation therapy  09/06/13- 11/02/13    prostate 7600 cGy 40 sessions, seminal vesicles 5600 cGy 40 sessions  . Arthritis   . GERD (gastroesophageal reflux disease)   . H/O renal calculi   . Shortness of breath dyspnea   . Gastrointestinal complaints   . Gout   . Arthritis     Past Surgical History  Procedure Laterality Date  . Coronary angioplasty with stent placement  10/17/1998    PCI with ~2.5 mm x 18 mm AVE BMS to D1  . Coronary angioplasty  07/21/1999    PTCA to ISR stenosis to LAD  . Nm myocar perf wall motion  11/06/2011    low risk 63% ; FIXED INFEROSEPTAL GUT ATTENUATION  .  Prostate biopsy  05/23/13    gleason 4+3=7, vol 34.32 cc  . Cholecystectomy  01/22/10  . Back surgery  1995    lumbar  . Coronary artery bypass graft N/A 03/21/2014    Procedure: CORONARY ARTERY BYPASS GRAFTING (CABG) TIMES FIVE USING LEFT INTERNAL MAMMARY TO LAD, SAPHENOUS VEIN GRAFT TO THE PD/PL, SAPHENOUS VEIN GRAFT TO DIAGONAL 1 AND 2 SEQUENTIALLY, SAPHENOUS VEIN HARVESTED ENDOSCOPICALLY;  Surgeon: Grace Isaac, MD;  Location: [facility];  Service: Open Heart Surgery;  Laterality: N/A;  . Tee without cardioversion N/A 03/21/2014    Procedure: TRANSESOPHAGEAL ECHOCARDIOGRAM (TEE);  Surgeon: Grace Isaac, MD;  Location: [facility];  Service: Open Heart Surgery;  Laterality: N/A;  . Left heart catheterization with coronary angiogram N/A 03/20/2014    Procedure: LEFT HEART CATHETERIZATION WITH CORONARY ANGIOGRAM;  Surgeon: Leonie Man, MD;  Location[facility] C[facility] LAB;  Service: Cardiovascular;  Laterality: N/A;  . Transthoracic echocardiogram  Mar 04, 2014    EF 55-60%, Gr 1 DD.  Mildly dilated Ascending Aorta; RV now back to normal  . Carotid & upper extr. ultrasound  July 2016    Mild Bilateral fibrous plaque in ICAs, normal Vertebral flow, No SCA stenosis    ROS: A comprehensive was performed. Review of Systems  Constitutional: Positive for malaise/fatigue (But improving on a weekly basis).  HENT: Negative for nosebleeds.   Respiratory: Negative for cough and shortness of breath.   Cardiovascular: Negative for claudication.  Gastrointestinal: Negative for blood in stool and melena.  Genitourinary: Negative for hematuria.  Musculoskeletal: Negative for myalgias (Notably improved since switching from atorvastatin to Crestor) and falls.  Neurological:       No more arm numbness  Endo/Heme/Allergies: Bruises/bleeds easily (Much better off of Xarelto).  Psychiatric/Behavioral:       Overall, his mood has been notably improved. He has anxiety level is doubly gotten better and is not having take  the Xanax as much. He is no longer taking an SSRI.  All other systems reviewed and are negative.   Current Outpatient Prescriptions on File Prior to Visit  Medication Sig Dispense Refill  . allopurinol (ZYLOPRIM) 300 MG tablet Take 300 mg by mouth daily.    Marland Kitchen aspirin EC 81 MG EC tablet Take 1 tablet (81 mg total) by mouth daily.    . metoprolol tartrate (LOPRESSOR) 25 MG tablet Take 0.5 tablets (12.5 mg total) by mouth 2 (two) times daily. (Patient taking differently: Take 12.5 mg by mouth daily. ) 90 tablet 3  . Multiple Vitamin (MULTIVITAMIN WITH MINERALS) TABS tablet Take 1 tablet by mouth daily.    Marland Kitchen omeprazole (PRILOSEC) 20 MG capsule Take 40 mg by mouth daily.     . rosuvastatin (CRESTOR) 20 MG tablet Take 20 mg by  mouth daily.     No current facility-administered medications on file prior to visit.   No Known Allergies  Social History  Substance Use Topics  . Smoking status: Former Smoker -- 2.00 packs/day for 25 years    Quit date: 04/20/1983  . Smokeless tobacco: Never Used  . Alcohol Use: 0.0 oz/week    0 Standard drinks or equivalent per week     Comment: 3 a day   Family History  Problem Relation Age of Onset  . Heart attack Father   . Stroke Mother   . Heart attack Brother   . Cancer Brother     one brother-prostate    Wt Readings from Last 3 Encounters:  04/17/15 198 lb (89.812 kg)  10/11/14 201 lb 4.8 oz (91.309 kg)  06/28/14 197 lb (89.359 kg)    PHYSICAL EXAM BP 176/86 mmHg  Pulse 72  Ht 6\' 1"  (1.854 m)  Wt 198 lb (89.812 kg)  BMI 26.13 kg/m2 General appearance: alert, cooperative, appears stated age; he looks much better today with more color, better affect. Neck: no adenopathy, no carotid bruit and no JVD Lungs: Improved basilar breath sounds, mostly CTA B, no rales or rhonchi. Normal effort Heart: RRR, S1&S2 normal (physiologic S2 split), no murmur, click, rub or gallop; nondisplaced PMI Abdomen: soft, non-tender; bowel sounds normal; no masses,  no organomegaly; mild truncal obesity Extremities: extremities normal, atraumatic, no cyanosis,or edema Pulses: 2+ and symmetric; Skin: mobility and turgor normal Neurologic: Mental status: Alert, oriented, thought content appropriate Cranial nerves: normal (II-XII grossly intact)   Adult ECG Report - not checked  Other studies Reviewed: Additional studies/ records that were reviewed today include:  Review of the above records demonstrates: - none No results found for: CHOL, HDL, LDLCALC, LDLDIRECT, TRIG, CHOLHDL   ASSESSMENT / PLAN: Problem List Items Addressed This Visit    Pulmonary embolism, bilateral, with pulmonary infarction (Chronic)    He completed a full year of Xarelto this month. Echocardiogram showed significant improvement in RV size and function. I think it's safe to stop anticoagulation since this was clearly an encighted PE. For now, will continue on ASA alone.      Relevant Medications   losartan (COZAAR) 25 MG tablet   Hyperlipidemia with target LDL less than 70 (Chronic)    Now on Crestor. Labs being monitored by PCP. Not listed, but he had been taking coenzyme Q 10 which I recommended.      Relevant Medications   losartan (COZAAR) 25 MG tablet   Essential hypertension (Chronic)    Now labile blood pressures. Previously unable to tolerate more than 12 mg twice a day and Lopressor. Blood pressures very high today now. We have started on Cozaar because he had high blood pressures at cardiac rehabilitation. This was stopped due to dizziness. Plan be to restart at one half dose daily which will be continued for 3 weeks and then try to increase to full dose. If he does not tolerate Cozaar, we'll consider amlodipine which was the medication that he was on prior to his CABG.      Relevant Medications   losartan (COZAAR) 25 MG tablet   CAD --> CABG x 5 - Primary (Chronic)    He is doing Well. Continuing with cardiac rehabilitation maintenance program. No angina or  failure symptoms. Not able tolerate much in the way of beta blocker. Is only on 12.5 mg twice a day Lopressor.  Will restart losartan at 12.5 mg daily. Hopefully after a  couple weeks if this dose he can increase to a full 25 mg. Is on aspirin and Crestor.      Relevant Medications   losartan (COZAAR) 25 MG tablet      Current medicines are reviewed at length with the patient today. (+/- concerns) - he had stopped losartan due to dizziness  The following changes have been made:  Restart losartan -take 1/2 tablet at night , For 2 weeks then increase to a full tablet.  Call office if new prescription is needed for losartan..  Your physician wants you to follow-up in 6 months Dr Ellyn Hack.  labs/ tests ordered today include:   No orders of the defined types were placed in this encounter.   Meds ordered this encounter  Medications  . losartan (COZAAR) 25 MG tablet    Sig: Take 25 mg by mouth daily.      Leonie Man, M.D., M.S. Interventional Cardiologist   Pager # [phone]

## 2015-04-17 NOTE — Progress Notes (Signed)
   Subjective:    Patient ID: Robert Boyle, male    DOB: [birth year], 79 y.o.   MRN: [id number]  HPI this patient presents to the office with a red, swollen, painful big toe, right foot. He says he had a pedicure that was given to him from his wife approximately one week ago. He states he has developed a red, swollen, painful area behind the big toenail on the right toe. He states it was extremely painful and red yesterday and he self treated with soaks. He says the toe is now better but he desires to have the toe evaluated The patient presents here for right great toe pain since 5 days, 4th toe that has a callous.   Review of Systems  All other systems reviewed and are negative.      Objective:   Physical Exam GENERAL APPEARANCE: Alert, conversant. Appropriately groomed. No acute distress.  VASCULAR: Pedal pulses palpable at  [facility] and PT bilateral.  Capillary refill time is immediate to all digits,  Normal temperature gradient.  Digital hair growth is present bilateral  NEUROLOGIC: sensation is normal to 5.07 monofilament at 5/5 sites bilateral.  Light touch is intact bilateral, Muscle strength normal.  MUSCULOSKELETAL: acceptable muscle strength, tone and stability bilateral.  Intrinsic muscluature intact bilateral.  Rectus appearance of foot and digits noted bilateral. Thickening 1st MPJ right foot.  DERMATOLOGIC: skin color, texture, and turgor are within normal limits.  No preulcerative lesions or ulcers  are seen, no interdigital maceration noted.  No open lesions present.  Digital nails are asymptomatic. No drainage noted. Red swollen painful skin area proximal to proximal nail bed.  No pus or granulation tissue noted.         Assessment & Plan:  Cellulitis right hallux.  IE  Prescribed cephalexin 500 mg.  Home soaks.  RTC 2 days if needed.  Gardiner Barefoot DPM

## 2015-04-19 ENCOUNTER — Encounter (HOSPITAL_COMMUNITY): Payer: Self-pay

## 2015-04-19 ENCOUNTER — Encounter: Payer: Self-pay | Admitting: Cardiology

## 2015-04-19 NOTE — Assessment & Plan Note (Signed)
Now labile blood pressures. Previously unable to tolerate more than 12 mg twice a day and Lopressor. Blood pressures very high today now. We have started on Cozaar because he had high blood pressures at cardiac rehabilitation. This was stopped due to dizziness. Plan be to restart at one half dose daily which will be continued for 3 weeks and then try to increase to full dose. If he does not tolerate Cozaar, we'll consider amlodipine which was the medication that he was on prior to his CABG.

## 2015-04-19 NOTE — Assessment & Plan Note (Signed)
He completed a full year of Xarelto this month. Echocardiogram showed significant improvement in RV size and function. I think it's safe to stop anticoagulation since this was clearly an encighted PE. For now, will continue on ASA alone.

## 2015-04-19 NOTE — Assessment & Plan Note (Addendum)
He is doing Well. Continuing with cardiac rehabilitation maintenance program. No angina or failure symptoms. Not able tolerate much in the way of beta blocker. Is only on 12.5 mg twice a day Lopressor.  Will restart losartan at 12.5 mg daily. Hopefully after a couple weeks if this dose he can increase to a full 25 mg. Is on aspirin and Crestor.

## 2015-04-19 NOTE — Assessment & Plan Note (Signed)
Now on Crestor. Labs being monitored by PCP. Not listed, but he had been taking coenzyme Q 10 which I recommended.

## 2015-04-24 ENCOUNTER — Encounter (HOSPITAL_COMMUNITY)
Admission: RE | Admit: 2015-04-24 | Discharge: 2015-04-24 | Disposition: A | Discharge status: Home / Self Care | Payer: Self-pay | Source: Ambulatory Visit | Attending: Cardiology | Admitting: Cardiology

## 2015-04-24 DIAGNOSIS — Z951 Presence of aortocoronary bypass graft: Secondary | ICD-10-CM | POA: Insufficient documentation

## 2015-04-24 DIAGNOSIS — Z48812 Encounter for surgical aftercare following surgery on the circulatory system: Secondary | ICD-10-CM | POA: Insufficient documentation

## 2015-04-26 ENCOUNTER — Encounter (HOSPITAL_COMMUNITY): Payer: Self-pay

## 2015-04-29 ENCOUNTER — Encounter (HOSPITAL_COMMUNITY): Payer: Self-pay

## 2015-05-01 ENCOUNTER — Other Ambulatory Visit: Payer: Self-pay | Admitting: Cardiology

## 2015-05-01 ENCOUNTER — Encounter (HOSPITAL_COMMUNITY): Payer: Self-pay

## 2015-05-01 MED ORDER — LOSARTAN POTASSIUM 25 MG PO TABS: 25.0000 mg | ORAL_TABLET | Freq: Every day | ORAL | Status: DC

## 2015-05-01 MED ORDER — METOPROLOL TARTRATE 25 MG PO TABS: 12.5000 mg | ORAL_TABLET | Freq: Every day | ORAL | Status: DC

## 2015-05-01 NOTE — Telephone Encounter (Signed)
°*  STAT* If patient is at the pharmacy, call can be transferred to refill team.   1. Which medications need to be refilled? (please list name of each medication and dose if known) Metoprolol Tart 25mg  , Losartan/Potassium 25mg    2. Which pharmacy/location (including street and city if local pharmacy) is medication to be sent to? [facility] [phone]  (Fax# [phone]) they ask that they we fax the new prescription in   3. Do they need a 30 day or 90 day supply? [place]

## 2015-05-03 ENCOUNTER — Encounter (HOSPITAL_COMMUNITY): Payer: Self-pay

## 2015-05-06 ENCOUNTER — Encounter (HOSPITAL_COMMUNITY)
Admission: RE | Admit: 2015-05-06 | Discharge: 2015-05-06 | Disposition: A | Discharge status: Home / Self Care | Payer: Self-pay | Source: Ambulatory Visit | Attending: Cardiology | Admitting: Cardiology

## 2015-05-08 ENCOUNTER — Encounter (HOSPITAL_COMMUNITY)
Admission: RE | Admit: 2015-05-08 | Discharge: 2015-05-08 | Disposition: A | Discharge status: Home / Self Care | Payer: Self-pay | Source: Ambulatory Visit | Attending: Cardiology | Admitting: Cardiology

## 2015-05-10 ENCOUNTER — Encounter (HOSPITAL_COMMUNITY): Payer: Self-pay

## 2015-05-13 ENCOUNTER — Encounter (HOSPITAL_COMMUNITY)
Admission: RE | Admit: 2015-05-13 | Discharge: 2015-05-13 | Disposition: A | Discharge status: Home / Self Care | Payer: Self-pay | Source: Ambulatory Visit | Attending: Cardiology | Admitting: Cardiology

## 2015-05-15 ENCOUNTER — Encounter (HOSPITAL_COMMUNITY)
Admission: RE | Admit: 2015-05-15 | Discharge: 2015-05-15 | Disposition: A | Discharge status: Home / Self Care | Payer: Self-pay | Source: Ambulatory Visit | Attending: Cardiology | Admitting: Cardiology

## 2015-05-17 ENCOUNTER — Encounter (HOSPITAL_COMMUNITY): Payer: Self-pay

## 2015-05-20 ENCOUNTER — Encounter (HOSPITAL_COMMUNITY)
Admission: RE | Admit: 2015-05-20 | Discharge: 2015-05-20 | Disposition: A | Discharge status: Home / Self Care | Payer: Self-pay | Source: Ambulatory Visit | Attending: Cardiology | Admitting: Cardiology

## 2015-05-22 ENCOUNTER — Encounter (HOSPITAL_COMMUNITY)
Admission: RE | Admit: 2015-05-22 | Discharge: 2015-05-22 | Disposition: A | Discharge status: Home / Self Care | Payer: Self-pay | Source: Ambulatory Visit | Attending: Cardiology | Admitting: Cardiology

## 2015-05-22 DIAGNOSIS — Z48812 Encounter for surgical aftercare following surgery on the circulatory system: Secondary | ICD-10-CM | POA: Insufficient documentation

## 2015-05-22 DIAGNOSIS — Z951 Presence of aortocoronary bypass graft: Secondary | ICD-10-CM | POA: Insufficient documentation

## 2015-05-24 ENCOUNTER — Encounter (HOSPITAL_COMMUNITY)
Admission: RE | Admit: 2015-05-24 | Discharge: 2015-05-24 | Disposition: A | Discharge status: Home / Self Care | Payer: Self-pay | Source: Ambulatory Visit | Attending: Cardiology | Admitting: Cardiology

## 2015-05-27 ENCOUNTER — Encounter (HOSPITAL_COMMUNITY): Payer: Self-pay

## 2015-05-29 ENCOUNTER — Encounter (HOSPITAL_COMMUNITY)
Admission: RE | Admit: 2015-05-29 | Discharge: 2015-05-29 | Disposition: A | Discharge status: Home / Self Care | Payer: Self-pay | Source: Ambulatory Visit | Attending: Cardiology | Admitting: Cardiology

## 2015-05-29 DIAGNOSIS — C61 Malignant neoplasm of prostate: Secondary | ICD-10-CM | POA: Insufficient documentation

## 2015-05-31 ENCOUNTER — Encounter (HOSPITAL_COMMUNITY)
Admission: RE | Admit: 2015-05-31 | Discharge: 2015-05-31 | Disposition: A | Discharge status: Home / Self Care | Payer: Self-pay | Source: Ambulatory Visit | Attending: Cardiology | Admitting: Cardiology

## 2015-06-03 ENCOUNTER — Encounter (HOSPITAL_COMMUNITY)
Admission: RE | Admit: 2015-06-03 | Discharge: 2015-06-03 | Disposition: A | Discharge status: Home / Self Care | Payer: Self-pay | Source: Ambulatory Visit | Attending: Cardiology | Admitting: Cardiology

## 2015-06-05 ENCOUNTER — Encounter (HOSPITAL_COMMUNITY)
Admission: RE | Admit: 2015-06-05 | Discharge: 2015-06-05 | Disposition: A | Discharge status: Home / Self Care | Payer: Self-pay | Source: Ambulatory Visit | Attending: Cardiology | Admitting: Cardiology

## 2015-06-07 ENCOUNTER — Encounter (HOSPITAL_COMMUNITY)
Admission: RE | Admit: 2015-06-07 | Discharge: 2015-06-07 | Disposition: A | Discharge status: Home / Self Care | Payer: Self-pay | Source: Ambulatory Visit | Attending: Cardiology | Admitting: Cardiology

## 2015-06-10 ENCOUNTER — Encounter (HOSPITAL_COMMUNITY)
Admission: RE | Admit: 2015-06-10 | Discharge: 2015-06-10 | Disposition: A | Discharge status: Home / Self Care | Payer: Self-pay | Source: Ambulatory Visit | Attending: Cardiology | Admitting: Cardiology

## 2015-06-12 ENCOUNTER — Encounter (HOSPITAL_COMMUNITY)
Admission: RE | Admit: 2015-06-12 | Discharge: 2015-06-12 | Disposition: A | Discharge status: Home / Self Care | Payer: Self-pay | Source: Ambulatory Visit | Attending: Cardiology | Admitting: Cardiology

## 2015-06-14 ENCOUNTER — Encounter (HOSPITAL_COMMUNITY)
Admission: RE | Admit: 2015-06-14 | Discharge: 2015-06-14 | Disposition: A | Discharge status: Home / Self Care | Payer: Self-pay | Source: Ambulatory Visit | Attending: Cardiology | Admitting: Cardiology

## 2015-06-17 ENCOUNTER — Encounter (HOSPITAL_COMMUNITY): Payer: Self-pay

## 2015-06-19 ENCOUNTER — Encounter (HOSPITAL_COMMUNITY)
Admission: RE | Admit: 2015-06-19 | Discharge: 2015-06-19 | Disposition: A | Discharge status: Home / Self Care | Payer: Self-pay | Source: Ambulatory Visit | Attending: Cardiology | Admitting: Cardiology

## 2015-06-19 DIAGNOSIS — Z48812 Encounter for surgical aftercare following surgery on the circulatory system: Secondary | ICD-10-CM | POA: Insufficient documentation

## 2015-06-19 DIAGNOSIS — Z951 Presence of aortocoronary bypass graft: Secondary | ICD-10-CM | POA: Insufficient documentation

## 2015-06-21 ENCOUNTER — Encounter (HOSPITAL_COMMUNITY)
Admission: RE | Admit: 2015-06-21 | Discharge: 2015-06-21 | Disposition: A | Discharge status: Home / Self Care | Payer: Self-pay | Source: Ambulatory Visit | Attending: Cardiology | Admitting: Cardiology

## 2015-06-24 ENCOUNTER — Encounter (HOSPITAL_COMMUNITY)
Admission: RE | Admit: 2015-06-24 | Discharge: 2015-06-24 | Disposition: A | Discharge status: Home / Self Care | Payer: Self-pay | Source: Ambulatory Visit | Attending: Cardiology | Admitting: Cardiology

## 2015-06-26 ENCOUNTER — Encounter (HOSPITAL_COMMUNITY)
Admission: RE | Admit: 2015-06-26 | Discharge: 2015-06-26 | Disposition: A | Discharge status: Home / Self Care | Payer: Self-pay | Source: Ambulatory Visit | Attending: Cardiology | Admitting: Cardiology

## 2015-06-28 ENCOUNTER — Encounter (HOSPITAL_COMMUNITY): Payer: Self-pay

## 2015-07-01 ENCOUNTER — Encounter (HOSPITAL_COMMUNITY)
Admission: RE | Admit: 2015-07-01 | Discharge: 2015-07-01 | Disposition: A | Discharge status: Home / Self Care | Payer: Self-pay | Source: Ambulatory Visit | Attending: Cardiology | Admitting: Cardiology

## 2015-07-03 ENCOUNTER — Encounter (HOSPITAL_COMMUNITY)
Admission: RE | Admit: 2015-07-03 | Discharge: 2015-07-03 | Disposition: A | Discharge status: Home / Self Care | Payer: Self-pay | Source: Ambulatory Visit | Attending: Cardiology | Admitting: Cardiology

## 2015-07-03 NOTE — Progress Notes (Signed)
Patient came to exercise today in the cardiac rehab maintenance program. Pt stated that he felt lightheaded walking to department from the main entrance of the hospital. Blood pressure was 160/80, heart rate 86. Pt stated he only had a banana and water for breakfast, so graham cracker with peanut butter, banana and lemonade given. Recheck BP was 142/82, HR 76. Pt felt better, proceeded to stationary bike for 6 minutes of exercise, BP 172/86, HR 88. BP on recumbent stepper was 168/78, HR 94. Pt felt better but decided to only exercise on 2 stations toady. Exit BP was 148/80, HR 76. Pt plans to contact his cardiologist regarding elevated blood pressures. Exercise flow sheets previously faxed.

## 2015-07-05 ENCOUNTER — Encounter (HOSPITAL_COMMUNITY)
Admission: RE | Admit: 2015-07-05 | Discharge: 2015-07-05 | Disposition: A | Discharge status: Home / Self Care | Payer: Self-pay | Source: Ambulatory Visit | Attending: Cardiology | Admitting: Cardiology

## 2015-07-08 ENCOUNTER — Encounter (HOSPITAL_COMMUNITY)
Admission: RE | Admit: 2015-07-08 | Discharge: 2015-07-08 | Disposition: A | Discharge status: Home / Self Care | Payer: Self-pay | Source: Ambulatory Visit | Attending: Cardiology | Admitting: Cardiology

## 2015-07-10 ENCOUNTER — Encounter (HOSPITAL_COMMUNITY)
Admission: RE | Admit: 2015-07-10 | Discharge: 2015-07-10 | Disposition: A | Discharge status: Home / Self Care | Payer: Self-pay | Source: Ambulatory Visit | Attending: Cardiology | Admitting: Cardiology

## 2015-07-12 ENCOUNTER — Encounter (HOSPITAL_COMMUNITY): Payer: Self-pay

## 2015-07-15 ENCOUNTER — Encounter (HOSPITAL_COMMUNITY): Payer: Self-pay

## 2015-07-17 ENCOUNTER — Encounter (HOSPITAL_COMMUNITY)
Admission: RE | Admit: 2015-07-17 | Discharge: 2015-07-17 | Disposition: A | Discharge status: Home / Self Care | Payer: Self-pay | Source: Ambulatory Visit | Attending: Cardiology | Admitting: Cardiology

## 2015-07-19 ENCOUNTER — Encounter (HOSPITAL_COMMUNITY)
Admission: RE | Admit: 2015-07-19 | Discharge: 2015-07-19 | Disposition: A | Discharge status: Home / Self Care | Payer: Self-pay | Source: Ambulatory Visit | Attending: Cardiology | Admitting: Cardiology

## 2015-07-22 ENCOUNTER — Encounter (HOSPITAL_COMMUNITY): Payer: Self-pay

## 2015-07-22 DIAGNOSIS — Z48812 Encounter for surgical aftercare following surgery on the circulatory system: Secondary | ICD-10-CM | POA: Insufficient documentation

## 2015-07-22 DIAGNOSIS — Z951 Presence of aortocoronary bypass graft: Secondary | ICD-10-CM | POA: Insufficient documentation

## 2015-07-24 ENCOUNTER — Encounter (HOSPITAL_COMMUNITY)
Admission: RE | Admit: 2015-07-24 | Discharge: 2015-07-24 | Disposition: A | Discharge status: Home / Self Care | Payer: Self-pay | Source: Ambulatory Visit | Attending: Cardiology | Admitting: Cardiology

## 2015-07-26 ENCOUNTER — Encounter (HOSPITAL_COMMUNITY)
Admission: RE | Admit: 2015-07-26 | Discharge: 2015-07-26 | Disposition: A | Discharge status: Home / Self Care | Payer: Self-pay | Source: Ambulatory Visit | Attending: Cardiology | Admitting: Cardiology

## 2015-07-29 ENCOUNTER — Encounter (HOSPITAL_COMMUNITY)
Admission: RE | Admit: 2015-07-29 | Discharge: 2015-07-29 | Disposition: A | Discharge status: Home / Self Care | Payer: Self-pay | Source: Ambulatory Visit | Attending: Cardiology | Admitting: Cardiology

## 2015-07-31 ENCOUNTER — Encounter (HOSPITAL_COMMUNITY)
Admission: RE | Admit: 2015-07-31 | Discharge: 2015-07-31 | Disposition: A | Discharge status: Home / Self Care | Payer: Self-pay | Source: Ambulatory Visit | Attending: Cardiology | Admitting: Cardiology

## 2015-08-02 ENCOUNTER — Encounter (HOSPITAL_COMMUNITY)
Admission: RE | Admit: 2015-08-02 | Discharge: 2015-08-02 | Disposition: A | Discharge status: Home / Self Care | Payer: Self-pay | Source: Ambulatory Visit | Attending: Cardiology | Admitting: Cardiology

## 2015-08-05 ENCOUNTER — Encounter (HOSPITAL_COMMUNITY)
Admission: RE | Admit: 2015-08-05 | Discharge: 2015-08-05 | Disposition: A | Discharge status: Home / Self Care | Payer: Self-pay | Source: Ambulatory Visit | Attending: Cardiology | Admitting: Cardiology

## 2015-08-07 ENCOUNTER — Encounter (HOSPITAL_COMMUNITY): Payer: Self-pay

## 2015-08-09 ENCOUNTER — Encounter (HOSPITAL_COMMUNITY): Payer: Self-pay

## 2015-08-12 ENCOUNTER — Encounter (HOSPITAL_COMMUNITY): Payer: Self-pay

## 2015-08-14 ENCOUNTER — Encounter (HOSPITAL_COMMUNITY)
Admission: RE | Admit: 2015-08-14 | Discharge: 2015-08-14 | Disposition: A | Discharge status: Home / Self Care | Payer: Self-pay | Source: Ambulatory Visit | Attending: Cardiology | Admitting: Cardiology

## 2015-08-16 ENCOUNTER — Encounter (HOSPITAL_COMMUNITY)
Admission: RE | Admit: 2015-08-16 | Discharge: 2015-08-16 | Disposition: A | Discharge status: Home / Self Care | Payer: Self-pay | Source: Ambulatory Visit | Attending: Cardiology | Admitting: Cardiology

## 2015-08-19 ENCOUNTER — Encounter (HOSPITAL_COMMUNITY): Payer: Self-pay

## 2015-08-19 DIAGNOSIS — Z48812 Encounter for surgical aftercare following surgery on the circulatory system: Secondary | ICD-10-CM | POA: Insufficient documentation

## 2015-08-19 DIAGNOSIS — Z951 Presence of aortocoronary bypass graft: Secondary | ICD-10-CM | POA: Insufficient documentation

## 2015-08-21 ENCOUNTER — Encounter (HOSPITAL_COMMUNITY)
Admission: RE | Admit: 2015-08-21 | Discharge: 2015-08-21 | Disposition: A | Discharge status: Home / Self Care | Payer: Self-pay | Source: Ambulatory Visit | Attending: Cardiology | Admitting: Cardiology

## 2015-08-23 ENCOUNTER — Encounter (HOSPITAL_COMMUNITY)
Admission: RE | Admit: 2015-08-23 | Discharge: 2015-08-23 | Disposition: A | Discharge status: Home / Self Care | Payer: Self-pay | Source: Ambulatory Visit | Attending: Cardiology | Admitting: Cardiology

## 2015-08-26 ENCOUNTER — Encounter (HOSPITAL_COMMUNITY)
Admission: RE | Admit: 2015-08-26 | Discharge: 2015-08-26 | Disposition: A | Discharge status: Home / Self Care | Payer: Self-pay | Source: Ambulatory Visit | Attending: Cardiology | Admitting: Cardiology

## 2015-08-28 ENCOUNTER — Encounter (HOSPITAL_COMMUNITY)
Admission: RE | Admit: 2015-08-28 | Discharge: 2015-08-28 | Disposition: A | Discharge status: Home / Self Care | Payer: Self-pay | Source: Ambulatory Visit | Attending: Cardiology | Admitting: Cardiology

## 2015-08-30 ENCOUNTER — Encounter (HOSPITAL_COMMUNITY): Payer: Self-pay

## 2015-09-02 ENCOUNTER — Encounter (HOSPITAL_COMMUNITY)
Admission: RE | Admit: 2015-09-02 | Discharge: 2015-09-02 | Disposition: A | Discharge status: Home / Self Care | Payer: Self-pay | Source: Ambulatory Visit | Attending: Cardiology | Admitting: Cardiology

## 2015-09-04 ENCOUNTER — Encounter (HOSPITAL_COMMUNITY): Payer: Self-pay

## 2015-09-06 ENCOUNTER — Encounter (HOSPITAL_COMMUNITY)
Admission: RE | Admit: 2015-09-06 | Discharge: 2015-09-06 | Disposition: A | Discharge status: Home / Self Care | Payer: Self-pay | Source: Ambulatory Visit | Attending: Cardiology | Admitting: Cardiology

## 2015-09-09 ENCOUNTER — Encounter (HOSPITAL_COMMUNITY)
Admission: RE | Admit: 2015-09-09 | Discharge: 2015-09-09 | Disposition: A | Discharge status: Home / Self Care | Payer: Self-pay | Source: Ambulatory Visit | Attending: Cardiology | Admitting: Cardiology

## 2015-09-11 ENCOUNTER — Encounter (HOSPITAL_COMMUNITY): Payer: Self-pay

## 2015-09-13 ENCOUNTER — Encounter (HOSPITAL_COMMUNITY)
Admission: RE | Admit: 2015-09-13 | Discharge: 2015-09-13 | Disposition: A | Discharge status: Home / Self Care | Payer: Self-pay | Source: Ambulatory Visit | Attending: Cardiology | Admitting: Cardiology

## 2015-09-18 ENCOUNTER — Encounter (HOSPITAL_COMMUNITY)
Admission: RE | Admit: 2015-09-18 | Discharge: 2015-09-18 | Disposition: A | Discharge status: Home / Self Care | Payer: Self-pay | Source: Ambulatory Visit | Attending: Cardiology | Admitting: Cardiology

## 2015-09-25 ENCOUNTER — Encounter (HOSPITAL_COMMUNITY)
Admission: RE | Admit: 2015-09-25 | Discharge: 2015-09-25 | Disposition: A | Discharge status: Home / Self Care | Payer: Self-pay | Source: Ambulatory Visit | Attending: Cardiology | Admitting: Cardiology

## 2015-09-25 DIAGNOSIS — Z951 Presence of aortocoronary bypass graft: Secondary | ICD-10-CM | POA: Insufficient documentation

## 2015-09-25 DIAGNOSIS — R5383 Other fatigue: Secondary | ICD-10-CM | POA: Insufficient documentation

## 2015-09-27 ENCOUNTER — Encounter (HOSPITAL_COMMUNITY)
Admission: RE | Admit: 2015-09-27 | Discharge: 2015-09-27 | Disposition: A | Discharge status: Home / Self Care | Payer: Self-pay | Source: Ambulatory Visit | Attending: Cardiology | Admitting: Cardiology

## 2015-09-30 ENCOUNTER — Encounter (HOSPITAL_COMMUNITY)
Admission: RE | Admit: 2015-09-30 | Discharge: 2015-09-30 | Disposition: A | Discharge status: Home / Self Care | Payer: Self-pay | Source: Ambulatory Visit | Attending: Cardiology | Admitting: Cardiology

## 2015-10-02 ENCOUNTER — Encounter (HOSPITAL_COMMUNITY)
Admission: RE | Admit: 2015-10-02 | Discharge: 2015-10-02 | Disposition: A | Discharge status: Home / Self Care | Payer: Self-pay | Source: Ambulatory Visit | Attending: Cardiology | Admitting: Cardiology

## 2015-10-04 ENCOUNTER — Encounter (HOSPITAL_COMMUNITY): Payer: Self-pay

## 2015-10-07 ENCOUNTER — Encounter (HOSPITAL_COMMUNITY)
Admission: RE | Admit: 2015-10-07 | Discharge: 2015-10-07 | Disposition: A | Discharge status: Home / Self Care | Payer: Self-pay | Source: Ambulatory Visit | Attending: Cardiology | Admitting: Cardiology

## 2015-10-09 ENCOUNTER — Encounter (HOSPITAL_COMMUNITY)
Admission: RE | Admit: 2015-10-09 | Discharge: 2015-10-09 | Disposition: A | Discharge status: Home / Self Care | Payer: Self-pay | Source: Ambulatory Visit | Attending: Cardiology | Admitting: Cardiology

## 2015-10-11 ENCOUNTER — Encounter (HOSPITAL_COMMUNITY)
Admission: RE | Admit: 2015-10-11 | Discharge: 2015-10-11 | Disposition: A | Discharge status: Home / Self Care | Payer: Self-pay | Source: Ambulatory Visit | Attending: Cardiology | Admitting: Cardiology

## 2015-10-14 ENCOUNTER — Encounter (HOSPITAL_COMMUNITY)
Admission: RE | Admit: 2015-10-14 | Discharge: 2015-10-14 | Disposition: A | Discharge status: Home / Self Care | Payer: Self-pay | Source: Ambulatory Visit | Attending: Cardiology | Admitting: Cardiology

## 2015-10-15 ENCOUNTER — Ambulatory Visit (INDEPENDENT_AMBULATORY_CARE_PROVIDER_SITE_OTHER): Payer: Medicare HMO | Admitting: Cardiology

## 2015-10-15 ENCOUNTER — Encounter: Payer: Self-pay | Admitting: Cardiology

## 2015-10-15 VITALS — BP 132/90 | HR 72 | Ht 72.0 in | Wt 200.6 lb

## 2015-10-15 DIAGNOSIS — E785 Hyperlipidemia, unspecified: Secondary | ICD-10-CM

## 2015-10-15 DIAGNOSIS — I10 Essential (primary) hypertension: Secondary | ICD-10-CM | POA: Diagnosis not present

## 2015-10-15 DIAGNOSIS — I2699 Other pulmonary embolism without acute cor pulmonale: Secondary | ICD-10-CM

## 2015-10-15 DIAGNOSIS — I2510 Atherosclerotic heart disease of native coronary artery without angina pectoris: Secondary | ICD-10-CM | POA: Diagnosis not present

## 2015-10-15 DIAGNOSIS — R5383 Other fatigue: Secondary | ICD-10-CM | POA: Diagnosis not present

## 2015-10-15 NOTE — Progress Notes (Signed)
PCP: Robert Noon, MD  Clinic Note: Chief Complaint  Patient presents with  . Follow-up    6 months  . Coronary Artery Disease    History of PCI followed by CABG    HPI: Robert Boyle is a 80 y.o. male with a PMH below who presents today for 6 month f/u CAD.  He is a former patient of Dr. Chase Boyle cardiac history dates back to 2000:  2000: BMS in D1  2001: PTCA of the stent restenosis.  He did well until the fall 2015 when he began noticing worsening exertional dyspnea and eventually developed chest heaviness and pressure concerning for crescendo angina.  He had a Myoview stress test showed inferior-inferolateral ischemia in November 15 --> cardiac catheterization w/ MV CAD --> referred for CABG x 5  His postop course was complicated right remission several weeks later with bilateral acute pulmonary emboli. It took him longer than usual to recover and has been on anticoagulation for the PE.  Robert Boyle was last seen in Dec 2016  Recent Hospitalizations: none  Studies Reviewed: none  Interval History: Robert Boyle presents today feeling quite well. He is happy to be off of Xarelto. Enjoying not having bruising issues. He is still doing cardiac rehabilitation, and doing the exercise. He does about 3-1/2-4 miles each day walking. He has not had any recurrent chest tightness or pressure with rest or exertion. He denies any PND, orthopnea or edema.  No palpitations, lightheadedness, dizziness, weakness or syncope/near syncope. No TIA/amaurosis fugax symptoms. No melena, hematochezia, hematuria, or epstaxis. No claudication.  ROS: A comprehensive was performed. Review of Systems  Constitutional: Negative for malaise/fatigue.  HENT: Negative for congestion and nosebleeds.   Eyes: Negative for blurred vision.  Respiratory: Negative for cough, shortness of breath and wheezing.   Cardiovascular: Negative.        Per history of present illness  Gastrointestinal:  Negative for heartburn, abdominal pain and constipation.  Musculoskeletal: Negative for myalgias, joint pain (He takes Celebrex for arthritis pain) and falls.  Skin: Negative.   Neurological: Negative for dizziness, seizures, loss of consciousness and headaches.  Endo/Heme/Allergies: Does not bruise/bleed easily.  Psychiatric/Behavioral: Negative for depression and memory loss. The patient is not nervous/anxious and does not have insomnia.   All other systems reviewed and are negative.   Past Medical History  Diagnosis Date  . CAD S/P percutaneous coronary angioplasty 2000, 2001, 03/2014    a) 2000: BMS to D1 75% stenosis (~2.5 mm x 18 mm AVE BMS); b) 2001 ISR of D1 stent - PTCA w/ 2.5 mm Balloon;; c) Myoview 11/'15: Intermediate Risk, inferior-inferolateral ischemia --> Cath: 40% proximal LAD with 90% mid; 90% mid D1 and D2; 100% proximal RCA with left-to-right collaterals for both PL and PDA --> CABG    . 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 Acute pulmonary embolus (Robert Boyle) 04/13/14    CTA: Bilateral Main PAs; suggestion of increased RV strain & R basal pulmonary infarct,  Mild dilation of the ascending aorta 2.8 cm noted. --> On Xarelto  . Essential hypertension   . Hyperlipidemia with target LDL less than 70   . Prostate cancer ([place]) 05/23/13    gleason 4+3=7, vloume 34.32 cc; s/p Chemo-XRT  . History of radiation therapy 09/06/13- 11/02/13    prostate 7600 cGy 40 sessions, seminal vesicles 5600 cGy 40 sessions  . Arthritis   . GERD (gastroesophageal reflux disease)   . H/O renal calculi   .  Shortness of breath dyspnea   . Gastrointestinal complaints   . Gout   . Arthritis     Past Surgical History  Procedure Laterality Date  . Coronary angioplasty with stent placement  10/17/1998    PCI with ~2.5 mm x 18 mm AVE BMS to D1  . Coronary angioplasty  07/21/1999    PTCA to ISR stenosis to LAD  . Nm myocar perf wall motion  11/06/2011    low risk 63% ; FIXED  INFEROSEPTAL GUT ATTENUATION  . Prostate biopsy  05/23/13    gleason 4+3=7, vol 34.32 cc  . Cholecystectomy  01/22/10  . Back surgery  1995    lumbar  . Coronary artery bypass graft N/A 03/21/2014    Procedure: CORONARY ARTERY BYPASS GRAFTING (CABG) TIMES FIVE USING LEFT INTERNAL MAMMARY TO LAD, SAPHENOUS VEIN GRAFT TO THE PD/PL, SAPHENOUS VEIN GRAFT TO DIAGONAL 1 AND 2 SEQUENTIALLY, SAPHENOUS VEIN HARVESTED ENDOSCOPICALLY;  Surgeon: Robert Isaac, MD;  Location: [facility];  Service: Open Heart Surgery;  Laterality: N/A;  . Tee without cardioversion N/A 03/21/2014    Procedure: TRANSESOPHAGEAL ECHOCARDIOGRAM (TEE);  Surgeon: Robert Isaac, MD;  Location: [facility];  Service: Open Heart Surgery;  Laterality: N/A;  . Left heart catheterization with coronary angiogram N/A 03/20/2014    Procedure: LEFT HEART CATHETERIZATION WITH CORONARY ANGIOGRAM;  Surgeon: Robert Man, MD;  Location: [facility] CATH LAB;  Service: Cardiovascular;  Laterality: N/A;  . Transthoracic echocardiogram  Mar 04, 2014    EF 55-60%, Gr 1 DD.  Mildly dilated Ascending Aorta; RV now back to normal  . Carotid & upper extr. ultrasound  July 2016    Mild Bilateral fibrous plaque in ICAs, normal Vertebral flow, No SCA stenosis   Prior to Admission medications   Medication Sig Start Date End Date Taking? Authorizing Provider  allopurinol (ZYLOPRIM) 300 MG tablet Take 300 mg by mouth daily.   Yes Historical Provider, MD  aspirin EC 81 MG EC tablet Take 1 tablet (81 mg total) by mouth daily. 04/18/14  Yes Robert Redwood, MD  celecoxib (CELEBREX) 200 MG capsule Take 200 mg by mouth daily. 05/01/15 04/30/16 Yes Historical Provider, MD  losartan (COZAAR) 25 MG tablet Take 1 tablet (25 mg total) by mouth daily. 05/01/15  Yes Robert Casino, MD  metoprolol tartrate (LOPRESSOR) 25 MG tablet Take 0.5 tablets (12.5 mg total) by mouth daily. 05/01/15  Yes Robert Casino, MD  Multiple Vitamin (MULTIVITAMIN WITH MINERALS) TABS tablet Take 1 tablet  by mouth daily.   Yes Historical Provider, MD  omeprazole (PRILOSEC) 20 MG capsule Take 40 mg by mouth daily.    Yes Historical Provider, MD  rosuvastatin (CRESTOR) 20 MG tablet Take 20 mg by mouth daily.   Yes Historical Provider, MD   No Known Allergies   Social History   Social History  . Marital Status: Single    Spouse Name: N/A  . Number of Children: N/A  . Years of Education: N/A   Social History Main Topics  . Smoking status: Former Smoker -- 2.00 packs/day for 25 years    Quit date: 04/20/1983  . Smokeless tobacco: Never Used  . Alcohol Use: 0.0 oz/week    0 Standard drinks or equivalent per week     Comment: 3 a day  . Drug Use: No  . Sexual Activity: Not Asked   Other Topics Concern  . None   Social History Narrative   Retired.    Divorced, but lives  with his ex-wife.   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 No tobacco products.   Plays golf with his friends and sons.   Family History family history includes Cancer in his brother; Heart attack in his brother and father; Stroke in his mother.   Wt Readings from Last 3 Encounters:  10/15/15 200 lb 9.6 oz (90.992 kg)  04/17/15 198 lb (89.812 kg)  10/11/14 201 lb 4.8 oz (91.309 kg)    PHYSICAL EXAM BP 132/90 mmHg  Pulse 72  Ht 6' (1.829 m)  Wt 200 lb 9.6 oz (90.992 kg)  BMI 27.20 kg/m2 General appearance: alert, cooperative, appears stated age; he looks much better today with more color, better affect. Neck: no adenopathy, no carotid bruit and no JVD Lungs: Improved basilar breath sounds, mostly CTA B, no rales or rhonchi. Normal effort Heart: RRR, S1&S2 normal (physiologic S2 split), no murmur, click, rub or gallop; nondisplaced PMI Abdomen: soft, non-tender; bowel sounds normal; no masses, no organomegaly; mild truncal obesity Extremities: extremities normal, atraumatic, no cyanosis,or edema Pulses: 2+ and symmetric; Skin: mobility and turgor normal Neurologic: Mental status: Alert, oriented, thought  content appropriate Cranial nerves: normal (II-XII grossly intact)   Adult ECG Report  Rate: 72 ;  Rhythm: normal sinus rhythm and Nonspecific ST and T-wave abnormality. Borderline first-degree AV block (PR interval 206) Otherwise normal axis, durations.;   Narrative Interpretation: Relatively normal EKG   Other studies Reviewed: Additional studies/ records that were reviewed today include:  Recent Labs:  Followed by PCP - was told that all labs "looked good" No results found for: CHOL, HDL, LDLCALC, LDLDIRECT, TRIG, CHOLHDL   ASSESSMENT / PLAN: Problem List Items Addressed This Visit    Pulmonary embolism, bilateral, with pulmonary infarction (Chronic)    No further symptoms following one year of anticoagulation      Hyperlipidemia with target LDL less than 70 (Chronic)    Continue to be stable on Crestor. Labs followed by PCP. Still taking coenzyme Q 10 but not listed.      Relevant Orders   EKG 12-Lead (Completed)   Fatigue due to treatment    Notably improved by allowing mild permissive hypertension and reducing beta blocker. Continue to be very active now and happy with level activity is able to accomplish.      Essential hypertension (Chronic)    Diastolic pressure is little high today, but he has had labile pressures. I'm inclined to leave things alone. He did okay with Cozaar, we may be gradually titrated up to 50 mg if necessary, for now we'll leave as is. He did not tolerate higher dose of beta blocker.      Relevant Orders   EKG 12-Lead (Completed)   CAD --> CABG x 5 - Primary (Chronic)    Doing well with no active angina symptoms following his CABG. He is finally now fully recovered. Continue to do cardiac rehabilitation maintenance program. No active angina or heart failure. On minimal dose of Lopressor. Unable tolerate increasing dose. Also on low-dose losartan. On aspirin and statin.      Relevant Orders   EKG 12-Lead (Completed)      Current medicines  are reviewed at length with the patient today. (+/- concerns) none The following changes have been made: none Studies Ordered:   Orders Placed This Encounter  Procedures  . EKG 12-Lead   ROV 6 Months   Glenetta Hew, M.D., M.S. Interventional Cardiologist   Pager # [phone] Phone # [phone] [address]  [place], [place] [address]

## 2015-10-15 NOTE — Patient Instructions (Signed)
NO CHANGES WITH CURRENT MEDICATIONS    Your physician wants you to follow-up in 6 MONTHS WITH DR HARDING.You will receive a reminder letter in the mail two months in advance. If you don't receive a letter, please call our office to schedule the follow-up appointment.    If you need a refill on your cardiac medications before your next appointment, please call your pharmacy.  

## 2015-10-16 ENCOUNTER — Encounter (HOSPITAL_COMMUNITY)
Admission: RE | Admit: 2015-10-16 | Discharge: 2015-10-16 | Disposition: A | Discharge status: Home / Self Care | Payer: Self-pay | Source: Ambulatory Visit | Attending: Cardiology | Admitting: Cardiology

## 2015-10-17 ENCOUNTER — Encounter: Payer: Self-pay | Admitting: Cardiology

## 2015-10-17 NOTE — Assessment & Plan Note (Signed)
Notably improved by allowing mild permissive hypertension and reducing beta blocker. Continue to be very active now and happy with level activity is able to accomplish.

## 2015-10-17 NOTE — Assessment & Plan Note (Signed)
Diastolic pressure is little high today, but he has had labile pressures. I'm inclined to leave things alone. He did okay with Cozaar, we may be gradually titrated up to 50 mg if necessary, for now we'll leave as is. He did not tolerate higher dose of beta blocker.

## 2015-10-17 NOTE — Assessment & Plan Note (Signed)
No further symptoms following one year of anticoagulation

## 2015-10-17 NOTE — Assessment & Plan Note (Signed)
Doing well with no active angina symptoms following his CABG. He is finally now fully recovered. Continue to do cardiac rehabilitation maintenance program. No active angina or heart failure. On minimal dose of Lopressor. Unable tolerate increasing dose. Also on low-dose losartan. On aspirin and statin.

## 2015-10-17 NOTE — Assessment & Plan Note (Signed)
Continue to be stable on Crestor. Labs followed by PCP. Still taking coenzyme Q 10 but not listed.

## 2015-10-18 ENCOUNTER — Encounter (HOSPITAL_COMMUNITY): Payer: Self-pay

## 2015-10-21 ENCOUNTER — Encounter (HOSPITAL_COMMUNITY): Payer: Self-pay

## 2015-10-21 DIAGNOSIS — R5383 Other fatigue: Secondary | ICD-10-CM | POA: Insufficient documentation

## 2015-10-21 DIAGNOSIS — Z951 Presence of aortocoronary bypass graft: Secondary | ICD-10-CM | POA: Insufficient documentation

## 2015-10-23 ENCOUNTER — Encounter (HOSPITAL_COMMUNITY)
Admission: RE | Admit: 2015-10-23 | Discharge: 2015-10-23 | Disposition: A | Discharge status: Home / Self Care | Payer: Self-pay | Source: Ambulatory Visit | Attending: Cardiology | Admitting: Cardiology

## 2015-10-25 ENCOUNTER — Encounter (HOSPITAL_COMMUNITY)
Admission: RE | Admit: 2015-10-25 | Discharge: 2015-10-25 | Disposition: A | Discharge status: Home / Self Care | Payer: Self-pay | Source: Ambulatory Visit | Attending: Cardiology | Admitting: Cardiology

## 2015-10-28 ENCOUNTER — Encounter (HOSPITAL_COMMUNITY)
Admission: RE | Admit: 2015-10-28 | Discharge: 2015-10-28 | Disposition: A | Discharge status: Home / Self Care | Payer: Self-pay | Source: Ambulatory Visit | Attending: Cardiology | Admitting: Cardiology

## 2015-10-30 ENCOUNTER — Encounter (HOSPITAL_COMMUNITY)
Admission: RE | Admit: 2015-10-30 | Discharge: 2015-10-30 | Disposition: A | Discharge status: Home / Self Care | Payer: Self-pay | Source: Ambulatory Visit | Attending: Cardiology | Admitting: Cardiology

## 2015-11-01 ENCOUNTER — Encounter (HOSPITAL_COMMUNITY): Payer: Self-pay

## 2015-11-04 ENCOUNTER — Encounter (HOSPITAL_COMMUNITY)
Admission: RE | Admit: 2015-11-04 | Discharge: 2015-11-04 | Disposition: A | Discharge status: Home / Self Care | Payer: Self-pay | Source: Ambulatory Visit | Attending: Cardiology | Admitting: Cardiology

## 2015-11-06 ENCOUNTER — Encounter (HOSPITAL_COMMUNITY)
Admission: RE | Admit: 2015-11-06 | Discharge: 2015-11-06 | Disposition: A | Discharge status: Home / Self Care | Payer: Self-pay | Source: Ambulatory Visit | Attending: Cardiology | Admitting: Cardiology

## 2015-11-08 ENCOUNTER — Encounter (HOSPITAL_COMMUNITY)
Admission: RE | Admit: 2015-11-08 | Discharge: 2015-11-08 | Disposition: A | Discharge status: Home / Self Care | Payer: Self-pay | Source: Ambulatory Visit | Attending: Cardiology | Admitting: Cardiology

## 2015-11-11 ENCOUNTER — Encounter (HOSPITAL_COMMUNITY)
Admission: RE | Admit: 2015-11-11 | Discharge: 2015-11-11 | Disposition: A | Discharge status: Home / Self Care | Payer: Self-pay | Source: Ambulatory Visit | Attending: Cardiology | Admitting: Cardiology

## 2015-11-13 ENCOUNTER — Encounter (HOSPITAL_COMMUNITY)
Admission: RE | Admit: 2015-11-13 | Discharge: 2015-11-13 | Disposition: A | Discharge status: Home / Self Care | Payer: Self-pay | Source: Ambulatory Visit | Attending: Cardiology | Admitting: Cardiology

## 2015-11-15 ENCOUNTER — Encounter (HOSPITAL_COMMUNITY): Payer: Self-pay

## 2015-11-18 ENCOUNTER — Encounter (HOSPITAL_COMMUNITY)
Admission: RE | Admit: 2015-11-18 | Discharge: 2015-11-18 | Disposition: A | Discharge status: Home / Self Care | Payer: Self-pay | Source: Ambulatory Visit | Attending: Cardiology | Admitting: Cardiology

## 2015-11-20 ENCOUNTER — Encounter (HOSPITAL_COMMUNITY)
Admission: RE | Admit: 2015-11-20 | Discharge: 2015-11-20 | Disposition: A | Discharge status: Home / Self Care | Payer: Self-pay | Source: Ambulatory Visit | Attending: Cardiology | Admitting: Cardiology

## 2015-11-20 DIAGNOSIS — Z951 Presence of aortocoronary bypass graft: Secondary | ICD-10-CM | POA: Insufficient documentation

## 2015-11-20 DIAGNOSIS — R5383 Other fatigue: Secondary | ICD-10-CM | POA: Insufficient documentation

## 2015-11-22 ENCOUNTER — Encounter (HOSPITAL_COMMUNITY)
Admission: RE | Admit: 2015-11-22 | Discharge: 2015-11-22 | Disposition: A | Discharge status: Home / Self Care | Payer: Self-pay | Source: Ambulatory Visit | Attending: Cardiology | Admitting: Cardiology

## 2015-11-25 ENCOUNTER — Encounter (HOSPITAL_COMMUNITY): Payer: Self-pay

## 2015-11-27 ENCOUNTER — Encounter (HOSPITAL_COMMUNITY): Payer: Self-pay

## 2015-11-29 ENCOUNTER — Encounter (HOSPITAL_COMMUNITY): Payer: Self-pay

## 2015-12-02 ENCOUNTER — Encounter (HOSPITAL_COMMUNITY)
Admission: RE | Admit: 2015-12-02 | Discharge: 2015-12-02 | Disposition: A | Discharge status: Home / Self Care | Payer: Self-pay | Source: Ambulatory Visit | Attending: Cardiology | Admitting: Cardiology

## 2015-12-03 ENCOUNTER — Other Ambulatory Visit: Payer: Self-pay | Admitting: Internal Medicine

## 2015-12-04 ENCOUNTER — Encounter (HOSPITAL_COMMUNITY)
Admission: RE | Admit: 2015-12-04 | Discharge: 2015-12-04 | Disposition: A | Discharge status: Home / Self Care | Payer: Self-pay | Source: Ambulatory Visit | Attending: Cardiology | Admitting: Cardiology

## 2015-12-06 ENCOUNTER — Encounter (HOSPITAL_COMMUNITY): Payer: Self-pay

## 2015-12-09 ENCOUNTER — Encounter (HOSPITAL_COMMUNITY): Payer: Self-pay

## 2015-12-11 ENCOUNTER — Encounter (HOSPITAL_COMMUNITY)
Admission: RE | Admit: 2015-12-11 | Discharge: 2015-12-11 | Disposition: A | Discharge status: Home / Self Care | Payer: Self-pay | Source: Ambulatory Visit | Attending: Cardiology | Admitting: Cardiology

## 2015-12-13 ENCOUNTER — Encounter (HOSPITAL_COMMUNITY)
Admission: RE | Admit: 2015-12-13 | Discharge: 2015-12-13 | Disposition: A | Discharge status: Home / Self Care | Payer: Self-pay | Source: Ambulatory Visit | Attending: Cardiology | Admitting: Cardiology

## 2015-12-16 ENCOUNTER — Encounter (HOSPITAL_COMMUNITY)
Admission: RE | Admit: 2015-12-16 | Discharge: 2015-12-16 | Disposition: A | Discharge status: Home / Self Care | Payer: Self-pay | Source: Ambulatory Visit | Attending: Cardiology | Admitting: Cardiology

## 2015-12-18 ENCOUNTER — Telehealth: Payer: Self-pay | Admitting: Cardiology

## 2015-12-18 ENCOUNTER — Encounter (HOSPITAL_COMMUNITY): Admission: RE | Admit: 2015-12-18 | Payer: Self-pay | Source: Ambulatory Visit

## 2015-12-18 MED ORDER — METOPROLOL TARTRATE 25 MG PO TABS: 12.5000 mg | ORAL_TABLET | Freq: Every day | ORAL | 1 refills | Status: DC

## 2015-12-18 NOTE — Telephone Encounter (Signed)
Robert Boyle is calling because his blood pressure 176/90 and heart rate is 106 , is on metoprolol , but has not had any . Will need a new prescription sent to   *STAT* If patient is at the pharmacy, call can be transferred to refill team.   1. Which medications need to be refilled? (please list name of each medication and dose if known) Metoprolol   2. Which pharmacy/location (including street and city if local pharmacy) is medication to be sent to?[facility]   3. Do they need a 30 day or 90 day supply?[place]

## 2015-12-18 NOTE — Telephone Encounter (Signed)
Thanks for refilling his prescription.  Glenetta Hew, MD

## 2015-12-18 NOTE — Telephone Encounter (Signed)
Called available number. Patient not currently at home. Robert Boyle (friend, listed DPR) answered phone and is aware I sent Rx to pharmacy. Requested pt call when he gets in to discuss any questions and to inform us if he is having any symptoms which may need to be addressed.

## 2015-12-18 NOTE — Telephone Encounter (Signed)
Patient called back. Spoke w/ patient regarding his refill. He notes he missed dose this AM, BP high at Cardiac Rehab, he has already gotten Rx that I sent in earlier and taken pill. Pt c/o no symptoms at this time. He is aware the metoprolol will take time to reach effect. Advised to continue BP, HR checks daily, note if elevations are persistent. Advised to call back if he has any issues or concerns.  Will route to his cardiologist for any further advice.

## 2015-12-20 ENCOUNTER — Encounter (HOSPITAL_COMMUNITY)
Admission: RE | Admit: 2015-12-20 | Discharge: 2015-12-20 | Disposition: A | Discharge status: Home / Self Care | Payer: Self-pay | Source: Ambulatory Visit | Attending: Cardiology | Admitting: Cardiology

## 2015-12-20 DIAGNOSIS — Z951 Presence of aortocoronary bypass graft: Secondary | ICD-10-CM | POA: Insufficient documentation

## 2015-12-20 DIAGNOSIS — R5383 Other fatigue: Secondary | ICD-10-CM | POA: Insufficient documentation

## 2015-12-24 ENCOUNTER — Other Ambulatory Visit: Payer: Self-pay | Admitting: Cardiology

## 2015-12-24 MED ORDER — LOSARTAN POTASSIUM 25 MG PO TABS: 25.0000 mg | ORAL_TABLET | Freq: Every day | ORAL | 2 refills | Status: DC

## 2015-12-24 NOTE — Telephone Encounter (Signed)
Rx(s) sent to pharmacy electronically.  

## 2015-12-24 NOTE — Telephone Encounter (Signed)
°*  STAT* If patient is at the pharmacy, call can be transferred to refill team.   1. Which medications need to be refilled? (please list name of each medication and dose if known) Losartan Potassium 25mg  ( Needs a Renewal  Prescription)   2. Which pharmacy/location (including street and city if local pharmacy) is medication to be sent to?[facility]   3. Do they need a 30 day or 90 day supply? [place]

## 2015-12-25 ENCOUNTER — Encounter (HOSPITAL_COMMUNITY): Payer: Self-pay

## 2015-12-27 ENCOUNTER — Encounter (HOSPITAL_COMMUNITY): Payer: Self-pay

## 2015-12-30 ENCOUNTER — Encounter (HOSPITAL_COMMUNITY): Payer: Self-pay

## 2016-01-01 ENCOUNTER — Encounter (HOSPITAL_COMMUNITY)
Admission: RE | Admit: 2016-01-01 | Discharge: 2016-01-01 | Disposition: A | Discharge status: Home / Self Care | Payer: Self-pay | Source: Ambulatory Visit | Attending: Cardiology | Admitting: Cardiology

## 2016-01-03 ENCOUNTER — Encounter (HOSPITAL_COMMUNITY)
Admission: RE | Admit: 2016-01-03 | Discharge: 2016-01-03 | Disposition: A | Discharge status: Home / Self Care | Payer: Self-pay | Source: Ambulatory Visit | Attending: Cardiology | Admitting: Cardiology

## 2016-01-06 ENCOUNTER — Encounter (HOSPITAL_COMMUNITY)
Admission: RE | Admit: 2016-01-06 | Discharge: 2016-01-06 | Disposition: A | Discharge status: Home / Self Care | Payer: Self-pay | Source: Ambulatory Visit | Attending: Cardiology | Admitting: Cardiology

## 2016-01-08 ENCOUNTER — Encounter (HOSPITAL_COMMUNITY): Payer: Self-pay

## 2016-01-10 ENCOUNTER — Encounter (HOSPITAL_COMMUNITY)
Admission: RE | Admit: 2016-01-10 | Discharge: 2016-01-10 | Disposition: A | Discharge status: Home / Self Care | Payer: Self-pay | Source: Ambulatory Visit | Attending: Cardiology | Admitting: Cardiology

## 2016-01-13 ENCOUNTER — Encounter (HOSPITAL_COMMUNITY)
Admission: RE | Admit: 2016-01-13 | Discharge: 2016-01-13 | Disposition: A | Discharge status: Home / Self Care | Payer: Self-pay | Source: Ambulatory Visit | Attending: Cardiology | Admitting: Cardiology

## 2016-01-15 ENCOUNTER — Encounter (HOSPITAL_COMMUNITY)
Admission: RE | Admit: 2016-01-15 | Discharge: 2016-01-15 | Disposition: A | Discharge status: Home / Self Care | Payer: Self-pay | Source: Ambulatory Visit | Attending: Cardiology | Admitting: Cardiology

## 2016-01-17 ENCOUNTER — Encounter (HOSPITAL_COMMUNITY): Payer: Self-pay

## 2016-01-20 ENCOUNTER — Encounter (HOSPITAL_COMMUNITY): Payer: Self-pay

## 2016-01-20 DIAGNOSIS — Z951 Presence of aortocoronary bypass graft: Secondary | ICD-10-CM | POA: Insufficient documentation

## 2016-01-20 DIAGNOSIS — R5383 Other fatigue: Secondary | ICD-10-CM | POA: Insufficient documentation

## 2016-01-22 ENCOUNTER — Encounter (HOSPITAL_COMMUNITY)
Admission: RE | Admit: 2016-01-22 | Discharge: 2016-01-22 | Disposition: A | Discharge status: Home / Self Care | Payer: Self-pay | Source: Ambulatory Visit | Attending: Cardiology | Admitting: Cardiology

## 2016-01-24 ENCOUNTER — Encounter (HOSPITAL_COMMUNITY)
Admission: RE | Admit: 2016-01-24 | Discharge: 2016-01-24 | Disposition: A | Discharge status: Home / Self Care | Payer: Self-pay | Source: Ambulatory Visit | Attending: Cardiology | Admitting: Cardiology

## 2016-01-27 ENCOUNTER — Encounter (HOSPITAL_COMMUNITY)
Admission: RE | Admit: 2016-01-27 | Discharge: 2016-01-27 | Disposition: A | Discharge status: Home / Self Care | Payer: Self-pay | Source: Ambulatory Visit | Attending: Cardiology | Admitting: Cardiology

## 2016-01-29 ENCOUNTER — Encounter (HOSPITAL_COMMUNITY)
Admission: RE | Admit: 2016-01-29 | Discharge: 2016-01-29 | Disposition: A | Discharge status: Home / Self Care | Payer: Self-pay | Source: Ambulatory Visit | Attending: Cardiology | Admitting: Cardiology

## 2016-01-31 ENCOUNTER — Encounter (HOSPITAL_COMMUNITY): Payer: Self-pay

## 2016-02-03 ENCOUNTER — Encounter (HOSPITAL_COMMUNITY)
Admission: RE | Admit: 2016-02-03 | Discharge: 2016-02-03 | Disposition: A | Discharge status: Home / Self Care | Payer: Self-pay | Source: Ambulatory Visit | Attending: Cardiology | Admitting: Cardiology

## 2016-02-05 ENCOUNTER — Encounter (HOSPITAL_COMMUNITY): Payer: Self-pay

## 2016-02-07 ENCOUNTER — Encounter (HOSPITAL_COMMUNITY): Payer: Self-pay

## 2016-02-10 ENCOUNTER — Encounter (HOSPITAL_COMMUNITY)
Admission: RE | Admit: 2016-02-10 | Discharge: 2016-02-10 | Disposition: A | Discharge status: Home / Self Care | Payer: Self-pay | Source: Ambulatory Visit | Attending: Cardiology | Admitting: Cardiology

## 2016-02-12 ENCOUNTER — Encounter (HOSPITAL_COMMUNITY): Payer: Self-pay

## 2016-02-14 ENCOUNTER — Encounter (HOSPITAL_COMMUNITY): Payer: Self-pay

## 2016-02-17 ENCOUNTER — Encounter (HOSPITAL_COMMUNITY)
Admission: RE | Admit: 2016-02-17 | Discharge: 2016-02-17 | Disposition: A | Discharge status: Home / Self Care | Payer: Self-pay | Source: Ambulatory Visit | Attending: Cardiology | Admitting: Cardiology

## 2016-02-19 ENCOUNTER — Encounter (HOSPITAL_COMMUNITY): Payer: Self-pay

## 2016-02-19 DIAGNOSIS — Z951 Presence of aortocoronary bypass graft: Secondary | ICD-10-CM | POA: Insufficient documentation

## 2016-02-19 DIAGNOSIS — R5383 Other fatigue: Secondary | ICD-10-CM | POA: Insufficient documentation

## 2016-02-21 ENCOUNTER — Encounter (HOSPITAL_COMMUNITY)
Admission: RE | Admit: 2016-02-21 | Discharge: 2016-02-21 | Disposition: A | Discharge status: Home / Self Care | Payer: Self-pay | Source: Ambulatory Visit | Attending: Cardiology | Admitting: Cardiology

## 2016-02-24 ENCOUNTER — Encounter (HOSPITAL_COMMUNITY): Payer: Self-pay

## 2016-02-26 ENCOUNTER — Encounter (HOSPITAL_COMMUNITY): Payer: Self-pay

## 2016-02-28 ENCOUNTER — Encounter (HOSPITAL_COMMUNITY): Payer: Self-pay

## 2016-03-02 ENCOUNTER — Encounter (HOSPITAL_COMMUNITY)
Admission: RE | Admit: 2016-03-02 | Discharge: 2016-03-02 | Disposition: A | Discharge status: Home / Self Care | Payer: Self-pay | Source: Ambulatory Visit | Attending: Cardiology | Admitting: Cardiology

## 2016-03-04 ENCOUNTER — Encounter (HOSPITAL_COMMUNITY)
Admission: RE | Admit: 2016-03-04 | Discharge: 2016-03-04 | Disposition: A | Discharge status: Home / Self Care | Payer: Self-pay | Source: Ambulatory Visit | Attending: Cardiology | Admitting: Cardiology

## 2016-03-06 ENCOUNTER — Encounter (HOSPITAL_COMMUNITY): Payer: Self-pay

## 2016-03-09 ENCOUNTER — Telehealth: Payer: Self-pay | Admitting: Cardiology

## 2016-03-09 ENCOUNTER — Encounter (HOSPITAL_COMMUNITY)
Admission: RE | Admit: 2016-03-09 | Discharge: 2016-03-09 | Disposition: A | Discharge status: Home / Self Care | Payer: Self-pay | Source: Ambulatory Visit | Attending: Cardiology | Admitting: Cardiology

## 2016-03-09 NOTE — Telephone Encounter (Signed)
Spoke w patient regarding appt tomorrow. He's not having acute symptoms, and notes a trend of elevated readings at cardiac rehab for past 2 weeks. Notes he had a similar occurrence of BPs running high for a while several months ago, and this seemed to stabilize. I've asked him if possible to record readings at home today and if he has a journal from home, to bring this with him to office visit tomorrow. Advised that tomorrow AM appt should be fine as scheduled, but that if he has new symptoms, concerns or questions to call our office back. Patient voiced thanks and acknowledgment of plan.

## 2016-03-09 NOTE — Telephone Encounter (Signed)
Pt's blood pressure have been running high. Cardiac Rehab told him he might need to see his doctor. I made an appointmentt for tomorrow with Kerin Ransom. Please call pt,see if tomorrow will be alright for him to be seen.

## 2016-03-10 ENCOUNTER — Encounter: Payer: Self-pay | Admitting: Cardiology

## 2016-03-10 ENCOUNTER — Ambulatory Visit (INDEPENDENT_AMBULATORY_CARE_PROVIDER_SITE_OTHER): Payer: Medicare HMO | Admitting: Student

## 2016-03-10 VITALS — BP 140/80 | HR 72 | Ht 72.0 in | Wt 196.6 lb

## 2016-03-10 DIAGNOSIS — I10 Essential (primary) hypertension: Secondary | ICD-10-CM

## 2016-03-10 DIAGNOSIS — I2510 Atherosclerotic heart disease of native coronary artery without angina pectoris: Secondary | ICD-10-CM

## 2016-03-10 DIAGNOSIS — E785 Hyperlipidemia, unspecified: Secondary | ICD-10-CM

## 2016-03-10 MED ORDER — LOSARTAN POTASSIUM 50 MG PO TABS: 50.0000 mg | ORAL_TABLET | Freq: Every day | ORAL | 3 refills | Status: DC

## 2016-03-10 NOTE — Progress Notes (Signed)
Cardiology Office Note    Date:  03/10/2016   ID:  Robert Boyle, DOB 07/27/1935, MRN [id number]  PCP:  Chesley Noon, MD  Cardiologist: Dr. Ellyn Hack  Chief Complaint  Patient presents with  . Hypertension    a. BP elevated for the past 2-3 months    History of Present Illness:    Robert Boyle is a 80 y.o. male with past medical history of CAD (s/p CABG in 2015 with LIMA-LAD, SVG-D1-D2, SVG sequentially to PDA and PLB), history of PE (occurring in 2015 following CABG), HTN, and HLD who presents to the office for follow-up of blood pressure.   He was last seen by Dr. Ellyn Hack in 09/2015 and reported doing well at that time. Had been exercising multiple times per week, walking 3-4 miles daily. Denied any chest discomfort, dyspnea with exertion, or palpitations at that time.   In talking with the patient today, he reports feeling well. Today is his birthday and he has plans to go to [employer] for dinner tonight. He participates in cardiac rehab 3 times per week. Is able to walk 4 miles while there and denies any anginal symptoms. They do check his BP multiple times during the visit and it has continually been elevated over the past 2 months. Systolic readings were initially in the 150's but have continued to rise. Yesterday, his initial BP was 190/low-100's. He was able to sit and rest and upon recheck this had improved to 170/90. It was checked again toward the end of his session and SBP remained elevated in the 170's. He checked it again upon returning home and at 1530 the reading was 159/74 and at 1830 his BP was 180/89. He took his once daily Lopressor dosing and then went to sleep. Today, BP has improved to 140/80.  He denies any symptoms of lightheadedness, dizziness, headaches, vision changes, or presyncope when his BP is elevated. Feels well but says he does become slightly anxious when he knows it is already elevated. Reports good compliance with his  medications. He only takes Lopressor 12.5mg  once daily (only at night) due to the side effects of fatigue. Has been on BID dosing in the past but was unable to tolerate this secondary to the side-effects.    Past Medical History:  Diagnosis Date  . Acute pulmonary embolus ([place]) 04/13/14   CTA: Bilateral Main PAs; suggestion of increased RV strain & R basal pulmonary infarct,  Mild dilation of the ascending aorta 2.8 cm noted. --> On Xarelto  . Arthritis   . Arthritis   . CAD S/P percutaneous coronary angioplasty 2000, 2001, 03/2014   a. 2000: BMS to D1 75% stenosis (~2.5 mm x 18 mm AVE BMS); b. 2001 ISR of D1 stent - PTCA w/ 2.5 mm Balloon;; c. Myoview 11/'15: Intermediate Risk, inferior-inferolateral ischemia --> Cath: 40% proximal LAD with 90% mid; 90% mid D1 and D2; 100% proximal RCA with left-to-right collaterals for both PL and PDA --> CABG  d. CABG 03/2014: LIMA-LAD, SVG-D1-D2, and SVG sequentially to PDA and PLB.  . Essential hypertension   . Gastrointestinal complaints   . GERD (gastroesophageal reflux disease)   . Gout   . H/O renal calculi   . History of radiation therapy 09/06/13- 11/02/13   prostate 7600 cGy 40 sessions, seminal vesicles 5600 cGy 40 sessions  . Hyperlipidemia with target LDL less than 70   . Prostate cancer ([place]) 05/23/13   gleason 4+3=7, vloume 34.32 cc; s/p Chemo-XRT  .  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 Shortness of breath dyspnea     Past Surgical History:  Procedure Laterality Date  . BACK SURGERY  1995   lumbar  . Carotid & Upper Extr. Ultrasound  July 2016   Mild Bilateral fibrous plaque in ICAs, normal Vertebral flow, No SCA stenosis  . CHOLECYSTECTOMY  01/22/10  . CORONARY ANGIOPLASTY  07/21/1999   PTCA to ISR stenosis to LAD  . CORONARY ANGIOPLASTY WITH STENT PLACEMENT  10/17/1998   PCI with ~2.5 mm x 18 mm AVE BMS to D1  . CORONARY ARTERY BYPASS GRAFT N/A 03/21/2014   Procedure: CORONARY ARTERY BYPASS GRAFTING (CABG)  TIMES FIVE USING LEFT INTERNAL MAMMARY TO LAD, SAPHENOUS VEIN GRAFT TO THE PD/PL, SAPHENOUS VEIN GRAFT TO DIAGONAL 1 AND 2 SEQUENTIALLY, SAPHENOUS VEIN HARVESTED ENDOSCOPICALLY;  Surgeon: Grace Isaac, MD;  Location: [facility];  Service: Open Heart Surgery;  Laterality: N/A;  . LEFT HEART CATHETERIZATION WITH CORONARY ANGIOGRAM N/A 03/20/2014   Procedure: LEFT HEART CATHETERIZATION WITH CORONARY ANGIOGRAM;  Surgeon: Leonie Man, MD;  Location: [facility] C[facility] LAB;  Service: Cardiovascular;  Laterality: N/A;  . NM MYOCAR PERF WALL MOTION  11/06/2011   low risk 63% ; FIXED INFEROSEPTAL GUT ATTENUATION  . PROSTATE BIOPSY  05/23/13   gleason 4+3=7, vol 34.32 cc  . TEE WITHOUT CARDIOVERSION N/A 03/21/2014   Procedure: TRANSESOPHAGEAL ECHOCARDIOGRAM (TEE);  Surgeon: Grace Isaac, MD;  Location: [facility];  Service: Open Heart Surgery;  Laterality: N/A;  . TRANSTHORACIC ECHOCARDIOGRAM  Mar 04, 2014   EF 55-60%, Gr 1 DD.  Mildly dilated Ascending Aorta; RV now back to normal    Current Medications: Outpatient Medications Prior to Visit  Medication Sig Dispense Refill  . allopurinol (ZYLOPRIM) 300 MG tablet Take 300 mg by mouth daily.    Marland Kitchen aspirin EC 81 MG EC tablet Take 1 tablet (81 mg total) by mouth daily.    . celecoxib (CELEBREX) 200 MG capsule Take 200 mg by mouth daily.    . metoprolol tartrate (LOPRESSOR) 25 MG tablet Take 0.5 tablets (12.5 mg total) by mouth daily. 45 tablet 1  . rosuvastatin (CRESTOR) 20 MG tablet Take 20 mg by mouth daily.    Marland Kitchen losartan (COZAAR) 25 MG tablet Take 1 tablet (25 mg total) by mouth daily. 90 tablet 2  . Multiple Vitamin (MULTIVITAMIN WITH MINERALS) TABS tablet Take 1 tablet by mouth daily.    Marland Kitchen omeprazole (PRILOSEC) 20 MG capsule Take 40 mg by mouth daily.      No facility-administered medications prior to visit.      Allergies:   Patient has no known allergies.   Social History   Social History  . Marital status: Single    Spouse name: N/A  . Number of  children: N/A  . Years of education: N/A   Social History Main Topics  . Smoking status: Former Smoker    Packs/day: 2.00    Years: 25.00    Quit date: 04/20/1983  . Smokeless tobacco: Never Used  . Alcohol use 0.0 oz/week     Comment: 3 a day  . Drug use: No  . Sexual activity: Not Asked   Other Topics Concern  . None   Social History Narrative   Retired.    Divorced, but lives with his ex-wife.   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 No tobacco products.   Plays golf with his friends and sons.     Family  History:  The patient's family history includes Cancer in his brother; Heart attack in his brother and father; Stroke in his mother.   Review of Systems:   Please see the history of present illness.     General:  No chills, fever, night sweats or weight changes.  Cardiovascular:  No chest pain, dyspnea on exertion, edema, orthopnea, palpitations, paroxysmal nocturnal dyspnea. Dermatological: No rash, lesions/masses Respiratory: No cough, dyspnea Urologic: No hematuria, dysuria Abdominal:   No nausea, vomiting, diarrhea, bright red blood per rectum, melena, or hematemesis Neurologic:  No visual changes, wkns, changes in mental status. All other systems reviewed and are otherwise negative except as noted above.   Physical Exam:    VS:  BP 140/80   Pulse 72   Ht 6' (1.829 m)   Wt 196 lb 9.6 oz (89.2 kg)   BMI 26.66 kg/m    General: Well developed, well nourished Caucasian male appearing in no acute distress. Head: Normocephalic, atraumatic, sclera non-icteric, no xanthomas, nares are without discharge.  Neck: No carotid bruits. JVD not elevated.  Lungs: Respirations regular and unlabored, without wheezes or rales.  Heart: Regular rate and rhythm. No S3 or S4.  No murmur, no rubs, or gallops appreciated. Abdomen: Soft, non-tender, non-distended with normoactive bowel sounds. No hepatomegaly. No rebound/guarding. No obvious abdominal masses. Msk:  Strength and tone  appear normal for age. No joint deformities or effusions. Extremities: No clubbing or cyanosis. No edema.  Distal pedal pulses are 2+ bilaterally. Neuro: Alert and oriented X 3. Moves all extremities spontaneously. No focal deficits noted. Psych:  Responds to questions appropriately with a normal affect. Skin: No rashes or lesions noted  Wt Readings from Last 3 Encounters:  03/10/16 196 lb 9.6 oz (89.2 kg)  10/15/15 200 lb 9.6 oz (91 kg)  04/17/15 198 lb (89.8 kg)     Studies/Labs Reviewed:   EKG:  EKG is not ordered today.   Recent Labs: No results found for requested labs within last 8760 hours.   Lipid Panel No results found for: CHOL, TRIG, HDL, CHOLHDL, VLDL, LDLCALC, LDLDIRECT  Additional studies/ records that were reviewed today include:   Study Conclusions  - Left ventricle: The cavity size was normal. Wall thickness was   increased in a pattern of mild LVH. Systolic function was normal.   The estimated ejection fraction was in the range of 55% to 60%.   Wall motion was normal; there were no regional wall motion   abnormalities. Doppler parameters are consistent with abnormal   left ventricular relaxation (grade 1 diastolic dysfunction). - Aortic root: The aortic root was mildly dilated. - Ascending aorta: The ascending aorta was mildly dilated.  Impressions:  - Normal LV systolic function; grade 1 diastolic dysfunction; mild   LVH; mildly dilated aortic root; trace MR; mild TR.   Assessment:    1. Essential hypertension   2. Atherosclerosis of native coronary artery of native heart without angina pectoris   3. Hyperlipidemia with target LDL less than 70      Plan:   In order of problems listed above:  1. Essential HTN - patient reports elevated BP readings over the past 2 months. Reports regular systolic readings in the [id number]. Yesterday, his initial BP while at cardiac rehab was 190/low-100's. He was able to sit and rest and upon recheck this had  improved to 170/90. - he denies any symptoms of headaches, vision changes, dizziness, chest discomfort or dyspnea. Asymptomatic when BP is elevated. - currently taking  Losartan 25mg  daily (in AM) and Lopressor 12.5mg  daily (in PM). Unable to tolerate BID dosing of his BB due to fatigue. Will therefore increase Losartan to 50mg  daily (to take in AM). He has been instructed to continue to check his BP regularly and record the values. Will call back if readings remain elevated following dose titration.   2. CAD without angina pectoris - s/p CABG in 2015 with LIMA-LAD, SVG-D1-D2, SVG sequentially to PDA and PLB. - he denies any recent anginal symptoms.  - continue ASA, statin, BB, and ARB.   3. HLD - Lipid Panel in 05/2015 showed total cholesterol 166, HDL 85, and LDL 58. At goal with target LDL < 70. - followed by PCP. Continue Crestor 20mg  daily.    Medication Adjustments/Labs and Tests Ordered: Current medicines are reviewed at length with the patient today.  Concerns regarding medicines are outlined above.  Medication changes, Labs and Tests ordered today are listed in the Patient Instructions below. Patient Instructions  Medication Instructions:  Your physician has recommended you make the following change in your medication:  1.  INCREASE the Losartan to 50 mg taking 1 tablet daily (2 of the 25 mg until your 50 mg comes in)  Labwork: None ordered  Testing/Procedures: None ordered  Follow-Up: Your physician recommends that you schedule a follow-up appointment in: [place] DR. HARDING   Any Other Special Instructions Will Be Listed Below (If Applicable).   If you need a refill on your cardiac medications before your next appointment, please call your pharmacy.   Arna Medici, PA  03/10/2016 9:54 AM    [facility] [place], [place], [place]  [address] Phone: [phone]; Fax: [phone]  [address], [place], [place] [address] Phone: [phone]

## 2016-03-10 NOTE — Patient Instructions (Addendum)
Medication Instructions:  Your physician has recommended you make the following change in your medication:  1.  INCREASE the Losartan to 50 mg taking 1 tablet daily (2 of the 25 mg until your 50 mg comes in)   Labwork: None ordered  Testing/Procedures: None ordered  Follow-Up: Your physician recommends that you schedule a follow-up appointment in: [place] DR. HARDING   Any Other Special Instructions Will Be Listed Below (If Applicable).    If you need a refill on your cardiac medications before your next appointment, please call your pharmacy.

## 2016-03-11 ENCOUNTER — Encounter (HOSPITAL_COMMUNITY): Payer: Self-pay

## 2016-03-16 ENCOUNTER — Encounter (HOSPITAL_COMMUNITY): Payer: Self-pay

## 2016-03-18 ENCOUNTER — Ambulatory Visit: Payer: Medicare HMO | Admitting: Cardiology

## 2016-03-18 ENCOUNTER — Encounter (HOSPITAL_COMMUNITY): Payer: Self-pay

## 2016-03-23 ENCOUNTER — Encounter (HOSPITAL_COMMUNITY)
Admission: RE | Admit: 2016-03-23 | Discharge: 2016-03-23 | Disposition: A | Discharge status: Home / Self Care | Payer: Self-pay | Source: Ambulatory Visit | Attending: Cardiology | Admitting: Cardiology

## 2016-03-23 DIAGNOSIS — Z951 Presence of aortocoronary bypass graft: Secondary | ICD-10-CM | POA: Insufficient documentation

## 2016-03-25 ENCOUNTER — Encounter (HOSPITAL_COMMUNITY)
Admission: RE | Admit: 2016-03-25 | Discharge: 2016-03-25 | Disposition: A | Discharge status: Home / Self Care | Payer: Self-pay | Source: Ambulatory Visit | Attending: Cardiology | Admitting: Cardiology

## 2016-03-27 ENCOUNTER — Encounter (HOSPITAL_COMMUNITY): Payer: Self-pay

## 2016-03-30 ENCOUNTER — Encounter (HOSPITAL_COMMUNITY)
Admission: RE | Admit: 2016-03-30 | Discharge: 2016-03-30 | Disposition: A | Discharge status: Home / Self Care | Payer: Self-pay | Source: Ambulatory Visit | Attending: Cardiology | Admitting: Cardiology

## 2016-04-01 ENCOUNTER — Encounter (HOSPITAL_COMMUNITY): Payer: Self-pay

## 2016-04-03 ENCOUNTER — Encounter (HOSPITAL_COMMUNITY)
Admission: RE | Admit: 2016-04-03 | Discharge: 2016-04-03 | Disposition: A | Discharge status: Home / Self Care | Payer: Self-pay | Source: Ambulatory Visit | Attending: Cardiology | Admitting: Cardiology

## 2016-04-06 ENCOUNTER — Encounter (HOSPITAL_COMMUNITY)
Admission: RE | Admit: 2016-04-06 | Discharge: 2016-04-06 | Disposition: A | Discharge status: Home / Self Care | Payer: Self-pay | Source: Ambulatory Visit | Attending: Cardiology | Admitting: Cardiology

## 2016-04-06 ENCOUNTER — Telehealth: Payer: Self-pay | Admitting: Cardiology

## 2016-04-06 NOTE — Telephone Encounter (Signed)
Robert Boyle ( Cardiac Rehab ) is calling  About

## 2016-04-06 NOTE — Telephone Encounter (Signed)
SPOKE TO CARDIAC REHAB.  wanted Dr Ellyn Hack TO KNOW PATIENT BLOOD PRESSURE AT REHAB TODAY 198/113 WITH PATIENT'S CUFF, 188/108 MANUAL CHECK BY REHAB.  SENDING COPY OF BLOOD PRESSURES AT [facility] AND PATIENT'S PRESSURE AT HOME TO REVIEW. WILL CONTACT PATIENT ONCE REVIEWED BY DR HARDING.

## 2016-04-06 NOTE — Progress Notes (Addendum)
Patient did not exercise today due to elevated blood pressure at rest. Patient's pre-exercise blood pressure today was 188/108, which has been a trend for him, and he has been unable to exercise on multiple occassions due to elevated BP. Patient was asked to record his BP readings at home, which he brought with him today and the readings were elevated at home as well. Dr. Allison Quarry office was contacted, and I spoke with Dr. Allison Quarry nurse, Ivin Booty who will consult with Dr. Ellyn Hack and make contact with the patient. Will hold exercise until BP is better managed. Pt's exercise log and his home readings were faxed to the office for review. Sol Passer, MS, ACSM CEP

## 2016-04-06 NOTE — Telephone Encounter (Signed)
Some improvement with the increased dose of losartan. Let's increased to full 100 mg losartan. Would also add 5 mg of amlodipine

## 2016-04-07 MED ORDER — AMLODIPINE BESYLATE 5 MG PO TABS: 5.0000 mg | ORAL_TABLET | Freq: Every day | ORAL | 3 refills | Status: DC

## 2016-04-07 MED ORDER — LOSARTAN POTASSIUM 100 MG PO TABS: 100.0000 mg | ORAL_TABLET | Freq: Every day | ORAL | 3 refills | Status: DC

## 2016-04-07 NOTE — Telephone Encounter (Signed)
SPOKE TO PATIENT. AWARE OF THE CHANGE AND ADDITION AMLODIPINE  E -SENT TO MAIL ORDER PER PATIENT REQUEST.

## 2016-04-08 ENCOUNTER — Encounter (HOSPITAL_COMMUNITY): Payer: Self-pay

## 2016-04-10 ENCOUNTER — Encounter (HOSPITAL_COMMUNITY): Payer: Self-pay

## 2016-04-15 ENCOUNTER — Encounter (HOSPITAL_COMMUNITY): Payer: Self-pay

## 2016-04-17 ENCOUNTER — Encounter (HOSPITAL_COMMUNITY): Payer: Self-pay

## 2016-04-22 ENCOUNTER — Encounter (HOSPITAL_COMMUNITY)
Admission: RE | Admit: 2016-04-22 | Discharge: 2016-04-22 | Disposition: A | Discharge status: Home / Self Care | Payer: Medicare HMO | Source: Ambulatory Visit | Attending: Cardiology | Admitting: Cardiology

## 2016-04-22 DIAGNOSIS — Z951 Presence of aortocoronary bypass graft: Secondary | ICD-10-CM | POA: Diagnosis present

## 2016-04-24 ENCOUNTER — Encounter (HOSPITAL_COMMUNITY): Payer: Medicare HMO

## 2016-04-27 ENCOUNTER — Encounter (HOSPITAL_COMMUNITY): Payer: Medicare HMO

## 2016-04-29 ENCOUNTER — Encounter (HOSPITAL_COMMUNITY): Payer: Medicare HMO

## 2016-05-01 ENCOUNTER — Other Ambulatory Visit: Payer: Self-pay

## 2016-05-01 ENCOUNTER — Encounter (HOSPITAL_COMMUNITY)
Admission: RE | Admit: 2016-05-01 | Discharge: 2016-05-01 | Disposition: A | Discharge status: Home / Self Care | Payer: Medicare HMO | Source: Ambulatory Visit | Attending: Cardiology | Admitting: Cardiology

## 2016-05-01 ENCOUNTER — Telehealth: Payer: Self-pay | Admitting: Cardiology

## 2016-05-01 ENCOUNTER — Ambulatory Visit (HOSPITAL_COMMUNITY): Payer: Medicare HMO | Attending: Family Medicine

## 2016-05-01 DIAGNOSIS — Z951 Presence of aortocoronary bypass graft: Secondary | ICD-10-CM | POA: Diagnosis not present

## 2016-05-01 NOTE — Telephone Encounter (Signed)
Please call asap

## 2016-05-01 NOTE — Telephone Encounter (Signed)
Spoke to cardiac rehab - Verdis Frederickson Concerned patient heart rate before starting maintenance rehab rate 122 strip obtained , ekg obtained no other symptoms  B/p 122/70 temp 98.2  heartrate back 100 to 108.  Patient medications metoprolol 12.5 mg at night only Amlodipine , losartan.  Reviewed information with BAhmed Prima PA Take extra 12.5 mg metoprolol when patient gets home  for next few days take metoprolol twice  Day then return to dose.  instruction given to patient , verbalized understanding.

## 2016-05-01 NOTE — Progress Notes (Signed)
Robert Boyle's heart was noted to be rapid upon check in to cardiac rehab maintenance. Blood pressure 122/70. Patient placed on the zoll. Rhythm appears to be sinus tach, heart rate 114. 12 lead ECG obtained to confirm rhythm. Robert Boyle reported feeling a little lightheaded and did not eat any breakfast. Robert Boyle also reports that he has had some sinus congestion. Temperature 98.2. Trixie Dredge RN, Dr Harding's nurse called and notified. Robert Pho PA reviewed Robert Boyle's ECG tracing at Dr Allison Quarry office. Patient instructed to take an extra 12.5 of metoprolol today. Robert Boyle did not exercise today. Exit blood pressure 142/78 with a heart rate of 100. Will continue to monitor the patient throughout  the program.Maria Venetia Maxon, RN,BSN 05/01/2016 4:17 PM

## 2016-05-04 ENCOUNTER — Encounter (HOSPITAL_COMMUNITY)
Admission: RE | Admit: 2016-05-04 | Discharge: 2016-05-04 | Disposition: A | Discharge status: Home / Self Care | Payer: Medicare HMO | Source: Ambulatory Visit | Attending: Cardiology | Admitting: Cardiology

## 2016-05-06 ENCOUNTER — Encounter (HOSPITAL_COMMUNITY): Payer: Medicare HMO

## 2016-05-08 ENCOUNTER — Encounter (HOSPITAL_COMMUNITY): Payer: Medicare HMO

## 2016-05-11 ENCOUNTER — Encounter (HOSPITAL_COMMUNITY)
Admission: RE | Admit: 2016-05-11 | Discharge: 2016-05-11 | Disposition: A | Discharge status: Home / Self Care | Payer: Medicare HMO | Source: Ambulatory Visit | Attending: Cardiology | Admitting: Cardiology

## 2016-05-13 ENCOUNTER — Encounter (HOSPITAL_COMMUNITY)
Admission: RE | Admit: 2016-05-13 | Discharge: 2016-05-13 | Disposition: A | Discharge status: Home / Self Care | Payer: Medicare HMO | Source: Ambulatory Visit | Attending: Cardiology | Admitting: Cardiology

## 2016-05-15 ENCOUNTER — Encounter (HOSPITAL_COMMUNITY)
Admission: RE | Admit: 2016-05-15 | Discharge: 2016-05-15 | Disposition: A | Discharge status: Home / Self Care | Payer: Medicare HMO | Source: Ambulatory Visit | Attending: Cardiology | Admitting: Cardiology

## 2016-05-18 ENCOUNTER — Encounter (HOSPITAL_COMMUNITY): Payer: Medicare HMO

## 2016-05-20 ENCOUNTER — Encounter (HOSPITAL_COMMUNITY)
Admission: RE | Admit: 2016-05-20 | Discharge: 2016-05-20 | Disposition: A | Discharge status: Home / Self Care | Payer: Medicare HMO | Source: Ambulatory Visit | Attending: Cardiology | Admitting: Cardiology

## 2016-05-22 ENCOUNTER — Encounter (HOSPITAL_COMMUNITY)
Admission: RE | Admit: 2016-05-22 | Discharge: 2016-05-22 | Disposition: A | Discharge status: Home / Self Care | Payer: Self-pay | Source: Ambulatory Visit | Attending: Cardiology | Admitting: Cardiology

## 2016-05-22 DIAGNOSIS — Z951 Presence of aortocoronary bypass graft: Secondary | ICD-10-CM | POA: Insufficient documentation

## 2016-05-25 ENCOUNTER — Encounter (HOSPITAL_COMMUNITY): Payer: Self-pay

## 2016-05-27 ENCOUNTER — Encounter (HOSPITAL_COMMUNITY): Payer: Self-pay

## 2016-05-29 ENCOUNTER — Encounter (HOSPITAL_COMMUNITY)
Admission: RE | Admit: 2016-05-29 | Discharge: 2016-05-29 | Disposition: A | Discharge status: Home / Self Care | Payer: Self-pay | Source: Ambulatory Visit | Attending: Cardiology | Admitting: Cardiology

## 2016-06-01 ENCOUNTER — Encounter (HOSPITAL_COMMUNITY)
Admission: RE | Admit: 2016-06-01 | Discharge: 2016-06-01 | Disposition: A | Discharge status: Home / Self Care | Payer: Self-pay | Source: Ambulatory Visit | Attending: Cardiology | Admitting: Cardiology

## 2016-06-03 ENCOUNTER — Encounter: Payer: Self-pay | Admitting: Cardiology

## 2016-06-03 ENCOUNTER — Ambulatory Visit (INDEPENDENT_AMBULATORY_CARE_PROVIDER_SITE_OTHER): Payer: Medicare HMO | Admitting: Cardiology

## 2016-06-03 ENCOUNTER — Encounter (HOSPITAL_COMMUNITY): Payer: Self-pay

## 2016-06-03 VITALS — BP 146/78 | HR 77 | Ht 72.0 in | Wt 198.0 lb

## 2016-06-03 DIAGNOSIS — R5383 Other fatigue: Secondary | ICD-10-CM

## 2016-06-03 DIAGNOSIS — Z951 Presence of aortocoronary bypass graft: Secondary | ICD-10-CM

## 2016-06-03 DIAGNOSIS — I25119 Atherosclerotic heart disease of native coronary artery with unspecified angina pectoris: Secondary | ICD-10-CM

## 2016-06-03 DIAGNOSIS — E785 Hyperlipidemia, unspecified: Secondary | ICD-10-CM

## 2016-06-03 DIAGNOSIS — I10 Essential (primary) hypertension: Secondary | ICD-10-CM | POA: Diagnosis not present

## 2016-06-03 MED ORDER — METOPROLOL TARTRATE 25 MG PO TABS: 12.5000 mg | ORAL_TABLET | Freq: Two times a day (BID) | ORAL | 3 refills | Status: DC

## 2016-06-03 MED ORDER — AMLODIPINE BESYLATE 10 MG PO TABS: 10.0000 mg | ORAL_TABLET | Freq: Every day | ORAL | 3 refills | Status: DC

## 2016-06-03 NOTE — Progress Notes (Signed)
PCP: Chesley Noon, MD  Clinic Note: Chief Complaint  Patient presents with  . Follow-up    3 months  . Coronary Artery Disease    Status post CABG  . Hypertension    HPI: Robert Boyle is a 81 y.o. male with a PMH below who presents today for 8 month f/u for CAD-CABG. Post-CABG was complicated by large pulmonary embolus. Was on anticoagulation for an entire year but now no longer on it. He has labile blood pressure and has been intolerant of increased dose of beta blocker. He continues to attend cardiac rehabilitation maintenance program, and is doing very well with it. He enjoys the common carotid artery, the monitoring of his pressures any exercise.  Robert Boyle was last seen in June 2017  Recent Hospitalizations: n/a  Studies Reviewed: None  Interval History: [name] presents today doing quite well overall from a cardiac standpoint. He continues to go to cardiac rehabilitation 3 days a week. He denies any recurrent anginal pain with rest or exertion. No exertional dyspnea.  No PND, orthopnea or edema. No palpitations, lightheadedness, dizziness, weakness or syncope/near syncope. No TIA/amaurosis fugax symptoms. No melena, hematochezia, hematuria, or epstaxis -- happy to be off of Xarelto. No claudication.  Cardiac rehabilitation he has noted some high blood pressure recordings, and it has improved some since increasing losartan. He still has some high numbers. He also notes his resting heart rate is usually in the 80s. His energy level is definitely improved as his blood pressure is improved and his exercise tolerance is getting better.  ROS: A comprehensive was performed. Review of Systems  Constitutional: Negative for malaise/fatigue (Energy is great).  HENT: Negative.        No signs of URI or flulike illness.  Musculoskeletal: Positive for joint pain (Standard arthritis pain).  Neurological: Negative for dizziness (Only when bending over).    Psychiatric/Behavioral: Negative for depression and memory loss. The patient does not have insomnia.   All other systems reviewed and are negative.   Past Medical History:  Diagnosis Date  . Acute pulmonary embolus ([place]) 04/13/14   CTA: Bilateral Main PAs; suggestion of increased RV strain & R basal pulmonary infarct,  Mild dilation of the ascending aorta 2.8 cm noted. --> On Xarelto  . Arthritis   . Arthritis   . CAD S/P percutaneous coronary angioplasty 2000, 2001, 03/2014   a. 2000: BMS to D1 75% stenosis (~2.5 mm x 18 mm AVE BMS); b. 2001 ISR of D1 stent - PTCA w/ 2.5 mm Balloon;; c. Myoview 11/'15: Intermediate Risk, inferior-inferolateral ischemia --> Cath: 40% proximal LAD with 90% mid; 90% mid D1 and D2; 100% proximal RCA with left-to-right collaterals for both PL and PDA --> CABG  d. CABG 03/2014: LIMA-LAD, SVG-D1-D2, and SVG sequentially to PDA and PLB.  . Essential hypertension   . Gastrointestinal complaints   . GERD (gastroesophageal reflux disease)   . Gout   . H/O renal calculi   . History of radiation therapy 09/06/13- 11/02/13   prostate 7600 cGy 40 sessions, seminal vesicles 5600 cGy 40 sessions  . Hyperlipidemia with target LDL less than 70   . Prostate cancer ([place]) 05/23/13   gleason 4+3=7, vloume 34.32 cc; s/p Chemo-XRT  . 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 Shortness of breath dyspnea     Past Surgical History:  Procedure Laterality Date  . BACK SURGERY  1995   lumbar  . Carotid & Upper Extr. Ultrasound  July 2016   Mild Bilateral fibrous plaque in ICAs, normal Vertebral flow, No SCA stenosis  . CHOLECYSTECTOMY  01/22/10  . CORONARY ANGIOPLASTY  07/21/1999   PTCA to ISR stenosis to LAD  . CORONARY ANGIOPLASTY WITH STENT PLACEMENT  10/17/1998   PCI with ~2.5 mm x 18 mm AVE BMS to D1  . CORONARY ARTERY BYPASS GRAFT N/A 03/21/2014   Procedure: CORONARY ARTERY BYPASS GRAFTING (CABG) TIMES FIVE USING LEFT INTERNAL MAMMARY TO LAD,  SAPHENOUS VEIN GRAFT TO THE PD/PL, SAPHENOUS VEIN GRAFT TO DIAGONAL 1 AND 2 SEQUENTIALLY, SAPHENOUS VEIN HARVESTED ENDOSCOPICALLY;  Surgeon: Grace Isaac, MD;  Location: [facility];  Service: Open Heart Surgery;  Laterality: N/A;  . LEFT HEART CATHETERIZATION WITH CORONARY ANGIOGRAM N/A 03/20/2014   Procedure: LEFT HEART CATHETERIZATION WITH CORONARY ANGIOGRAM;  Surgeon: Leonie Man, MD;  Location: [facility] C[facility] LAB;  Service: Cardiovascular;  Laterality: N/A;  . NM MYOCAR PERF WALL MOTION  11/06/2011   low risk 63% ; FIXED INFEROSEPTAL GUT ATTENUATION  . PROSTATE BIOPSY  05/23/13   gleason 4+3=7, vol 34.32 cc  . TEE WITHOUT CARDIOVERSION N/A 03/21/2014   Procedure: TRANSESOPHAGEAL ECHOCARDIOGRAM (TEE);  Surgeon: Grace Isaac, MD;  Location: [facility];  Service: Open Heart Surgery;  Laterality: N/A;  . TRANSTHORACIC ECHOCARDIOGRAM  Mar 04, 2014   EF 55-60%, Gr 1 DD.  Mildly dilated Ascending Aorta; RV now back to normal    Current Meds  Medication Sig  . allopurinol (ZYLOPRIM) 300 MG tablet Take 300 mg by mouth daily.  Marland Kitchen aspirin EC 81 MG EC tablet Take 1 tablet (81 mg total) by mouth daily.  . celecoxib (CELEBREX) 200 MG capsule Take 200 mg by mouth daily.  Marland Kitchen losartan (COZAAR) 100 MG tablet Take 1 tablet (100 mg total) by mouth daily.  . metoprolol tartrate (LOPRESSOR) 25 MG tablet Take 0.5 tablets (12.5 mg total) by mouth 2 (two) times daily.  . rosuvastatin (CRESTOR) 20 MG tablet Take 20 mg by mouth daily.  . [DISCONTINUED] amLODipine (NORVASC) 5 MG tablet Take 1 tablet (5 mg total) by mouth daily.  . [DISCONTINUED] metoprolol tartrate (LOPRESSOR) 25 MG tablet Take 0.5 tablets (12.5 mg total) by mouth daily.  . [DISCONTINUED] metoprolol tartrate (LOPRESSOR) 25 MG tablet Take 0.5 tablets (12.5 mg total) by mouth 2 (two) times daily.    No Known Allergies  Social History   Social History  . Marital status: Single    Spouse name: N/A  . Number of children: N/A  . Years of education: N/A    Social History Main Topics  . Smoking status: Former Smoker    Packs/day: 2.00    Years: 25.00    Quit date: 04/20/1983  . Smokeless tobacco: Never Used  . Alcohol use 0.0 oz/week     Comment: 3 a day  . Drug use: No  . Sexual activity: Not Asked   Other Topics Concern  . None   Social History Narrative   Retired.    Divorced, but lives with his ex-wife.   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 No tobacco products.   Plays golf with his friends and sons.    family history includes Cancer in his brother; Heart attack in his brother and father; Stroke in his mother.  Wt Readings from Last 3 Encounters:  06/03/16 89.8 kg (198 lb)  03/10/16 89.2 kg (196 lb 9.6 oz)  10/15/15 91 kg (200 lb 9.6 oz)    PHYSICAL EXAM BP (!) 146/78  Pulse 77   Ht 6' (1.829 m)   Wt 89.8 kg (198 lb)   BMI 26.85 kg/m  General appearance: alert, cooperative, appears stated age; he looks much better today with more color, better affect. Neck: no adenopathy, no carotid bruit and no JVD Lungs: Improved basilar breath sounds, mostly CTA B, no rales or rhonchi. Normal effort Heart: RRR, S1&S2 normal (physiologic S2 split), no murmur, click, rub or gallop; nondisplaced PMI Abdomen: soft, non-tender; bowel sounds normal; no masses, no organomegaly; mild truncal obesity Extremities: extremities normal, atraumatic, no cyanosis,or edema Pulses: 2+ and symmetric; Skin: mobility and turgor normal Neurologic: Mental status: Alert, oriented, thought content appropriate Cranial nerves: normal (II-XII grossly intact)    Adult ECG Report  Rate: 77 ;  Rhythm: normal sinus rhythm and 1 AVB (204). Nonspecific T wave abnormality.; Incomplete right bundle branch block  Narrative Interpretation: Stable EKG   Other studies Reviewed: Additional studies/ records that were reviewed today include:  Recent Labs:  Recently checked by PCP. Was told that they're close to goal. I do not have the results  currently.  ASSESSMENT / PLAN: Problem List Items Addressed This Visit    Essential hypertension (Chronic)    Not quite at goal. He still having some peaks in his blood pressure at cardiac rehabilitation. Plan for now is to increase his amlodipine to 10 mg a day. Will also use 12 twice a day metoprolol.      Relevant Medications   metoprolol tartrate (LOPRESSOR) 25 MG tablet   amLODipine (NORVASC) 10 MG tablet   Other Relevant Orders   EKG 12-Lead (Completed)   Fatigue due to treatment    Fatigue level has been much improved as his blood pressure is increased. We had cut down on his beta blocker and he is only taking it once a day. I think now we can probably try to increase it up to twice a day since his heart rate is up in the 80s at baseline.      Relevant Orders   EKG 12-Lead (Completed)   Hyperlipidemia with target LDL less than 70 (Chronic)    Lipids are managed by PCP. I don't have the last results, but he was told that they look good. He is on statin and tolerating it well.      Relevant Medications   metoprolol tartrate (LOPRESSOR) 25 MG tablet   amLODipine (NORVASC) 10 MG tablet   Other Relevant Orders   EKG 12-Lead (Completed)   MV CAD - Unstable Angina --> CABG x 5; now Stable w/o Angina - Primary (Chronic)    No recurrent anginal chest pain. Continues to be active with cardiac rehabilitation. Maintaining a better diet and is continuing to lose weight. He is on low-dose beta blocker but tolerating it well. Increasing to twice a day now. He is on max dose losartan as well as amlodipine which is being increased to 20 mg. He is on statin and aspirin.      Relevant Medications   metoprolol tartrate (LOPRESSOR) 25 MG tablet   amLODipine (NORVASC) 10 MG tablet   Other Relevant Orders   EKG 12-Lead (Completed)   S/P CABG x 5: LIMA-LAD, SVG-D1-D2, SVG-RPDA-RPL (Chronic)    Finally he seems to be recovering fully. He is now over 2 years out and doing very well with cardiac  rehabilitation. Would be due for a stress test in roughly 2 years.      Relevant Orders   EKG 12-Lead (Completed)  Current medicines are reviewed at length with the patient today. (+/- concerns) Has still had some high blood pressures at cardiac rehabilitation. Energy level has improved. -- He asked about quantity of alcohol drinks per day : Recommended no more than 2 The following changes have been made: See below.  Patient Instructions  MEDICATION CHNAGES:  INCREASE AMLODIPINE to 10 mg  One tablet daily  Try taking Metoprolol tartrate 12.5 mg twice a day    Your physician wants you to follow-up in 6 months Dr Ellyn Hack. You will receive a reminder letter in the mail two months in advance. If you don't receive a letter, please call our office to schedule the follow-up appointment.   If you need a refill on your cardiac medications before your next appointment, please call your pharmacy.     Studies Ordered:   Orders Placed This Encounter  Procedures  . EKG 12-Lead      Glenetta Hew, M.D., M.S. Interventional Cardiologist   Pager # [phone] Phone # [phone] [address] [place], [place] [address]

## 2016-06-03 NOTE — Assessment & Plan Note (Signed)
Lipids are managed by PCP. I don't have the last results, but he was told that they look good. He is on statin and tolerating it well.

## 2016-06-03 NOTE — Assessment & Plan Note (Signed)
Not quite at goal. He still having some peaks in his blood pressure at cardiac rehabilitation. Plan for now is to increase his amlodipine to 10 mg a day. Will also use 12 twice a day metoprolol.

## 2016-06-03 NOTE — Assessment & Plan Note (Signed)
Finally he seems to be recovering fully. He is now over 2 years out and doing very well with cardiac rehabilitation. Would be due for a stress test in roughly 2 years.

## 2016-06-03 NOTE — Assessment & Plan Note (Signed)
Fatigue level has been much improved as his blood pressure is increased. We had cut down on his beta blocker and he is only taking it once a day. I think now we can probably try to increase it up to twice a day since his heart rate is up in the 80s at baseline.

## 2016-06-03 NOTE — Patient Instructions (Addendum)
MEDICATION CHNAGES:  INCREASE AMLODIPINE to 10 mg  One tablet daily  Try taking Metoprolol tartrate 12.5 mg twice a day    Your physician wants you to follow-up in 6 months Dr Ellyn Hack. You will receive a reminder letter in the mail two months in advance. If you don't receive a letter, please call our office to schedule the follow-up appointment.   If you need a refill on your cardiac medications before your next appointment, please call your pharmacy.

## 2016-06-03 NOTE — Assessment & Plan Note (Signed)
No recurrent anginal chest pain. Continues to be active with cardiac rehabilitation. Maintaining a better diet and is continuing to lose weight. He is on low-dose beta blocker but tolerating it well. Increasing to twice a day now. He is on max dose losartan as well as amlodipine which is being increased to 20 mg. He is on statin and aspirin.

## 2016-06-04 ENCOUNTER — Encounter: Payer: Self-pay | Admitting: Cardiology

## 2016-06-05 ENCOUNTER — Encounter (HOSPITAL_COMMUNITY)
Admission: RE | Admit: 2016-06-05 | Discharge: 2016-06-05 | Disposition: A | Discharge status: Home / Self Care | Payer: Self-pay | Source: Ambulatory Visit | Attending: Cardiology | Admitting: Cardiology

## 2016-06-08 ENCOUNTER — Encounter (HOSPITAL_COMMUNITY): Payer: Self-pay

## 2016-06-10 ENCOUNTER — Encounter (HOSPITAL_COMMUNITY)
Admission: RE | Admit: 2016-06-10 | Discharge: 2016-06-10 | Disposition: A | Discharge status: Home / Self Care | Payer: Self-pay | Source: Ambulatory Visit | Attending: Cardiology | Admitting: Cardiology

## 2016-06-12 ENCOUNTER — Encounter (HOSPITAL_COMMUNITY): Payer: Self-pay

## 2016-06-15 ENCOUNTER — Encounter (HOSPITAL_COMMUNITY)
Admission: RE | Admit: 2016-06-15 | Discharge: 2016-06-15 | Disposition: A | Discharge status: Home / Self Care | Payer: Self-pay | Source: Ambulatory Visit | Attending: Cardiology | Admitting: Cardiology

## 2016-06-17 ENCOUNTER — Encounter (HOSPITAL_COMMUNITY)
Admission: RE | Admit: 2016-06-17 | Discharge: 2016-06-17 | Disposition: A | Discharge status: Home / Self Care | Payer: Self-pay | Source: Ambulatory Visit | Attending: Cardiology | Admitting: Cardiology

## 2016-06-19 ENCOUNTER — Encounter (HOSPITAL_COMMUNITY): Payer: Self-pay

## 2016-06-19 DIAGNOSIS — Z951 Presence of aortocoronary bypass graft: Secondary | ICD-10-CM | POA: Insufficient documentation

## 2016-06-22 ENCOUNTER — Encounter (HOSPITAL_COMMUNITY)
Admission: RE | Admit: 2016-06-22 | Discharge: 2016-06-22 | Disposition: A | Discharge status: Home / Self Care | Payer: Self-pay | Source: Ambulatory Visit | Attending: Cardiology | Admitting: Cardiology

## 2016-06-24 ENCOUNTER — Encounter (HOSPITAL_COMMUNITY): Payer: Self-pay

## 2016-06-26 ENCOUNTER — Encounter (HOSPITAL_COMMUNITY): Payer: Self-pay

## 2016-06-29 ENCOUNTER — Encounter (HOSPITAL_COMMUNITY): Payer: Self-pay

## 2016-07-01 ENCOUNTER — Encounter (HOSPITAL_COMMUNITY)
Admission: RE | Admit: 2016-07-01 | Discharge: 2016-07-01 | Disposition: A | Discharge status: Home / Self Care | Payer: Self-pay | Source: Ambulatory Visit | Attending: Cardiology | Admitting: Cardiology

## 2016-07-03 ENCOUNTER — Encounter (HOSPITAL_COMMUNITY): Payer: Self-pay

## 2016-07-06 ENCOUNTER — Encounter (HOSPITAL_COMMUNITY): Admission: RE | Admit: 2016-07-06 | Payer: Self-pay | Source: Ambulatory Visit

## 2016-07-08 ENCOUNTER — Encounter (HOSPITAL_COMMUNITY)
Admission: RE | Admit: 2016-07-08 | Discharge: 2016-07-08 | Disposition: A | Discharge status: Home / Self Care | Payer: Self-pay | Source: Ambulatory Visit | Attending: Cardiology | Admitting: Cardiology

## 2016-07-10 ENCOUNTER — Encounter (HOSPITAL_COMMUNITY): Payer: Self-pay

## 2016-07-13 ENCOUNTER — Encounter (HOSPITAL_COMMUNITY)
Admission: RE | Admit: 2016-07-13 | Discharge: 2016-07-13 | Disposition: A | Discharge status: Home / Self Care | Payer: Self-pay | Source: Ambulatory Visit | Attending: Cardiology | Admitting: Cardiology

## 2016-07-15 ENCOUNTER — Encounter (HOSPITAL_COMMUNITY): Payer: Self-pay

## 2016-07-17 ENCOUNTER — Encounter (HOSPITAL_COMMUNITY)
Admission: RE | Admit: 2016-07-17 | Discharge: 2016-07-17 | Disposition: A | Discharge status: Home / Self Care | Payer: Self-pay | Source: Ambulatory Visit | Attending: Cardiology | Admitting: Cardiology

## 2016-07-20 ENCOUNTER — Encounter (HOSPITAL_COMMUNITY)
Admission: RE | Admit: 2016-07-20 | Discharge: 2016-07-20 | Disposition: A | Discharge status: Home / Self Care | Payer: Self-pay | Source: Ambulatory Visit | Attending: Cardiology | Admitting: Cardiology

## 2016-07-20 DIAGNOSIS — Z951 Presence of aortocoronary bypass graft: Secondary | ICD-10-CM | POA: Insufficient documentation

## 2016-07-22 ENCOUNTER — Encounter (HOSPITAL_COMMUNITY)
Admission: RE | Admit: 2016-07-22 | Discharge: 2016-07-22 | Disposition: A | Discharge status: Home / Self Care | Payer: Self-pay | Source: Ambulatory Visit | Attending: Cardiology | Admitting: Cardiology

## 2016-07-24 ENCOUNTER — Encounter (HOSPITAL_COMMUNITY)
Admission: RE | Admit: 2016-07-24 | Discharge: 2016-07-24 | Disposition: A | Discharge status: Home / Self Care | Payer: Self-pay | Source: Ambulatory Visit | Attending: Cardiology | Admitting: Cardiology

## 2016-07-27 ENCOUNTER — Encounter (HOSPITAL_COMMUNITY): Payer: Self-pay

## 2016-07-29 ENCOUNTER — Encounter (HOSPITAL_COMMUNITY)
Admission: RE | Admit: 2016-07-29 | Discharge: 2016-07-29 | Disposition: A | Discharge status: Home / Self Care | Payer: Self-pay | Source: Ambulatory Visit | Attending: Cardiology | Admitting: Cardiology

## 2016-07-31 ENCOUNTER — Encounter (HOSPITAL_COMMUNITY)
Admission: RE | Admit: 2016-07-31 | Discharge: 2016-07-31 | Disposition: A | Discharge status: Home / Self Care | Payer: Self-pay | Source: Ambulatory Visit | Attending: Cardiology | Admitting: Cardiology

## 2016-08-03 ENCOUNTER — Encounter (HOSPITAL_COMMUNITY): Payer: Self-pay

## 2016-08-05 ENCOUNTER — Encounter (HOSPITAL_COMMUNITY)
Admission: RE | Admit: 2016-08-05 | Discharge: 2016-08-05 | Disposition: A | Discharge status: Home / Self Care | Payer: Self-pay | Source: Ambulatory Visit | Attending: Cardiology | Admitting: Cardiology

## 2016-08-07 ENCOUNTER — Encounter (HOSPITAL_COMMUNITY): Payer: Self-pay

## 2016-08-10 ENCOUNTER — Encounter (HOSPITAL_COMMUNITY): Payer: Self-pay

## 2016-08-12 ENCOUNTER — Encounter (HOSPITAL_COMMUNITY)
Admission: RE | Admit: 2016-08-12 | Discharge: 2016-08-12 | Disposition: A | Discharge status: Home / Self Care | Payer: Self-pay | Source: Ambulatory Visit | Attending: Cardiology | Admitting: Cardiology

## 2016-08-14 ENCOUNTER — Encounter (HOSPITAL_COMMUNITY)
Admission: RE | Admit: 2016-08-14 | Discharge: 2016-08-14 | Disposition: A | Discharge status: Home / Self Care | Payer: Self-pay | Source: Ambulatory Visit | Attending: Cardiology | Admitting: Cardiology

## 2016-08-15 ENCOUNTER — Encounter (HOSPITAL_COMMUNITY): Payer: Self-pay | Admitting: *Deleted

## 2016-08-15 ENCOUNTER — Emergency Department (HOSPITAL_COMMUNITY)
Admission: EM | Admit: 2016-08-15 | Discharge: 2016-08-15 | Disposition: A | Discharge status: Home / Self Care | Payer: Medicare HMO | Attending: Emergency Medicine | Admitting: Emergency Medicine

## 2016-08-15 DIAGNOSIS — Z8546 Personal history of malignant neoplasm of prostate: Secondary | ICD-10-CM | POA: Insufficient documentation

## 2016-08-15 DIAGNOSIS — Z951 Presence of aortocoronary bypass graft: Secondary | ICD-10-CM | POA: Insufficient documentation

## 2016-08-15 DIAGNOSIS — I10 Essential (primary) hypertension: Secondary | ICD-10-CM | POA: Diagnosis not present

## 2016-08-15 DIAGNOSIS — Z87891 Personal history of nicotine dependence: Secondary | ICD-10-CM | POA: Insufficient documentation

## 2016-08-15 DIAGNOSIS — Z7982 Long term (current) use of aspirin: Secondary | ICD-10-CM | POA: Diagnosis not present

## 2016-08-15 DIAGNOSIS — Z955 Presence of coronary angioplasty implant and graft: Secondary | ICD-10-CM | POA: Insufficient documentation

## 2016-08-15 DIAGNOSIS — I2510 Atherosclerotic heart disease of native coronary artery without angina pectoris: Secondary | ICD-10-CM | POA: Insufficient documentation

## 2016-08-15 DIAGNOSIS — R319 Hematuria, unspecified: Secondary | ICD-10-CM | POA: Insufficient documentation

## 2016-08-15 LAB — CBC
HEMATOCRIT: 40.5 % (ref 39.0–52.0)
Hemoglobin: 13.4 g/dL (ref 13.0–17.0)
MCH: 30.2 pg (ref 26.0–34.0)
MCHC: 33.1 g/dL (ref 30.0–36.0)
MCV: 91.4 fL (ref 78.0–100.0)
PLATELETS: 98 10*3/uL — AB (ref 150–400)
RBC: 4.43 MIL/uL (ref 4.22–5.81)
RDW: 13.7 % (ref 11.5–15.5)
WBC: 5.2 10*3/uL (ref 4.0–10.5)

## 2016-08-15 LAB — COMPREHENSIVE METABOLIC PANEL
ALT: 43 U/L (ref 17–63)
ANION GAP: 10 (ref 5–15)
AST: 47 U/L — ABNORMAL HIGH (ref 15–41)
Albumin: 4.4 g/dL (ref 3.5–5.0)
Alkaline Phosphatase: 67 U/L (ref 38–126)
BILIRUBIN TOTAL: 1.2 mg/dL (ref 0.3–1.2)
BUN: 13 mg/dL (ref 6–20)
CO2: 22 mmol/L (ref 22–32)
Calcium: 9.3 mg/dL (ref 8.9–10.3)
Chloride: 106 mmol/L (ref 101–111)
Creatinine, Ser: 1.07 mg/dL (ref 0.61–1.24)
GFR calc non Af Amer: >60 mL/min (ref >60)
Glucose, Bld: 82 mg/dL (ref 65–99)
POTASSIUM: 3.6 mmol/L (ref 3.5–5.1)
Sodium: 138 mmol/L (ref 135–145)
TOTAL PROTEIN: 6.9 g/dL (ref 6.5–8.1)

## 2016-08-15 LAB — URINALYSIS, ROUTINE W REFLEX MICROSCOPIC
Bilirubin Urine: NEGATIVE
GLUCOSE, UA: 50 mg/dL — AB
Ketones, ur: NEGATIVE mg/dL
Leukocytes, UA: NEGATIVE
Nitrite: NEGATIVE
PROTEIN: 100 mg/dL — AB
Specific Gravity, Urine: 1.008 (ref 1.005–1.030)
pH: 6 (ref 5.0–8.0)

## 2016-08-15 NOTE — ED Provider Notes (Signed)
[facility] DEPT Provider Note   CSN: [id number] Arrival date & time: 08/15/16  1537     History   Chief Complaint Chief Complaint  Patient presents with  . Hematuria    HPI BURR SOFFER is a 81 y.o. male.  Patient is a 81 year old male with a past medical history of coronary artery disease status post stenting currently on a baby aspirin, prostate cancer with radiation therapy 3 years ago and kidney stones presenting today with hematuria. Patient states approximately 4 hours ago he went to the bathroom and noticed blood in the toilet. Symptoms progressed to have more bleeding and mild urinary urgency. He denies any abdominal pain, dysuria. No prior symptoms of urinary problems yesterday or the day before. No fevers.   The history is provided by the patient.  Hematuria  This is a new problem. The current episode started 3 to 5 hours ago. The problem occurs constantly. The problem has been gradually worsening. Pertinent negatives include no abdominal pain and no shortness of breath. Nothing aggravates the symptoms. Nothing relieves the symptoms. He has tried nothing for the symptoms. The treatment provided no relief.    Past Medical History:  Diagnosis Date  . Acute pulmonary embolus ([place]) 04/13/14   CTA: Bilateral Main PAs; suggestion of increased RV strain & R basal pulmonary infarct,  Mild dilation of the ascending aorta 2.8 cm noted. --> On Xarelto  . Arthritis   . Arthritis   . CAD S/P percutaneous coronary angioplasty 2000, 2001, 03/2014   a. 2000: BMS to D1 75% stenosis (~2.5 mm x 18 mm AVE BMS); b. 2001 ISR of D1 stent - PTCA w/ 2.5 mm Balloon;; c. Myoview 11/'15: Intermediate Risk, inferior-inferolateral ischemia --> Cath: 40% proximal LAD with 90% mid; 90% mid D1 and D2; 100% proximal RCA with left-to-right collaterals for both PL and PDA --> CABG  d. CABG 03/2014: LIMA-LAD, SVG-D1-D2, and SVG sequentially to PDA and PLB.  . Essential hypertension   .  Gastrointestinal complaints   . GERD (gastroesophageal reflux disease)   . Gout   . H/O renal calculi   . History of radiation therapy 09/06/13- 11/02/13   prostate 7600 cGy 40 sessions, seminal vesicles 5600 cGy 40 sessions  . Hyperlipidemia with target LDL less than 70   . Prostate cancer ([place]) 05/23/13   gleason 4+3=7, vloume 34.32 cc; s/p Chemo-XRT  . 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 Shortness of breath dyspnea     Patient Active Problem List   Diagnosis Date Noted  . Fatigue due to treatment 05/06/2014  . Pulmonary embolism, bilateral, with pulmonary infarction 04/14/2014  . S/P CABG x 5: LIMA-LAD, SVG-D1-D2, SVG-RPDA-RPL 03/21/2014  . MV CAD - Unstable Angina --> CABG x 5; now Stable w/o Angina 03/20/2014  . Prostate cancer ([place])   . Gout 12/19/2012  . Hyperlipidemia with target LDL less than 70 10/17/1998  . Essential hypertension 09/19/1998    Past Surgical History:  Procedure Laterality Date  . BACK SURGERY  1995   lumbar  . Carotid & Upper Extr. Ultrasound  July 2016   Mild Bilateral fibrous plaque in ICAs, normal Vertebral flow, No SCA stenosis  . CHOLECYSTECTOMY  01/22/10  . CORONARY ANGIOPLASTY  07/21/1999   PTCA to ISR stenosis to LAD  . CORONARY ANGIOPLASTY WITH STENT PLACEMENT  10/17/1998   PCI with ~2.5 mm x 18 mm AVE BMS to D1  . CORONARY ARTERY BYPASS GRAFT N/A 03/21/2014   Procedure:  CORONARY ARTERY BYPASS GRAFTING (CABG) TIMES FIVE USING LEFT INTERNAL MAMMARY TO LAD, SAPHENOUS VEIN GRAFT TO THE PD/PL, SAPHENOUS VEIN GRAFT TO DIAGONAL 1 AND 2 SEQUENTIALLY, SAPHENOUS VEIN HARVESTED ENDOSCOPICALLY;  Surgeon: Grace Isaac, MD;  Location: [facility];  Service: Open Heart Surgery;  Laterality: N/A;  . LEFT HEART CATHETERIZATION WITH CORONARY ANGIOGRAM N/A 03/20/2014   Procedure: LEFT HEART CATHETERIZATION WITH CORONARY ANGIOGRAM;  Surgeon: Leonie Man, MD;  Location: [facility] CATH LAB;  Service: Cardiovascular;  Laterality: N/A;  . NM  MYOCAR PERF WALL MOTION  11/06/2011   low risk 63% ; FIXED INFEROSEPTAL GUT ATTENUATION  . PROSTATE BIOPSY  05/23/13   gleason 4+3=7, vol 34.32 cc  . TEE WITHOUT CARDIOVERSION N/A 03/21/2014   Procedure: TRANSESOPHAGEAL ECHOCARDIOGRAM (TEE);  Surgeon: Grace Isaac, MD;  Location: [facility];  Service: Open Heart Surgery;  Laterality: N/A;  . TRANSTHORACIC ECHOCARDIOGRAM  Mar 04, 2014   EF 55-60%, Gr 1 DD.  Mildly dilated Ascending Aorta; RV now back to normal       Home Medications    Prior to Admission medications   Medication Sig Start Date End Date Taking? Authorizing Provider  allopurinol (ZYLOPRIM) 300 MG tablet Take 300 mg by mouth daily.    Historical Provider, MD  amLODipine (NORVASC) 10 MG tablet Take 1 tablet (10 mg total) by mouth daily. 06/03/16 09/01/16  Leonie Man, MD  aspirin EC 81 MG EC tablet Take 1 tablet (81 mg total) by mouth daily. 04/18/14   Delfina Redwood, MD  celecoxib (CELEBREX) 200 MG capsule Take 200 mg by mouth daily.    Historical Provider, MD  losartan (COZAAR) 100 MG tablet Take 1 tablet (100 mg total) by mouth daily. 04/07/16   Leonie Man, MD  metoprolol tartrate (LOPRESSOR) 25 MG tablet Take 0.5 tablets (12.5 mg total) by mouth 2 (two) times daily. 06/03/16   Leonie Man, MD  rosuvastatin (CRESTOR) 20 MG tablet Take 20 mg by mouth daily.    Historical Provider, MD    Family History Family History  Problem Relation Age of Onset  . Heart attack Father   . Stroke Mother   . Heart attack Brother   . Cancer Brother     one brother-prostate    Social History Social History  Substance Use Topics  . Smoking status: Former Smoker    Packs/day: 2.00    Years: 25.00    Quit date: 04/20/1983  . Smokeless tobacco: Never Used  . Alcohol use 0.0 oz/week     Comment: 3 a day     Allergies   Patient has no known allergies.   Review of Systems Review of Systems  Respiratory: Negative for shortness of breath.   Gastrointestinal:  Negative for abdominal pain.  Genitourinary: Positive for hematuria.  All other systems reviewed and are negative.    Physical Exam Updated Vital Signs BP (!) 159/84 (BP Location: Left Arm)   Pulse 82   Temp 98.1 F (36.7 C) (Oral)   Resp 16   Ht 6' (1.829 m)   Wt 200 lb (90.7 kg)   SpO2 99%   BMI 27.12 kg/m   Physical Exam  Constitutional: He is oriented to person, place, and time. He appears well-developed and well-nourished. No distress.  HENT:  Head: Normocephalic and atraumatic.  Mouth/Throat: Oropharynx is clear and moist.  Eyes: Conjunctivae and EOM are normal. Pupils are equal, round, and reactive to light.  Neck: Normal range of motion. Neck supple.  Cardiovascular: Normal rate, regular rhythm and intact distal pulses.   No murmur heard. Pulmonary/Chest: Effort normal and breath sounds normal. No respiratory distress. He has no wheezes. He has no rales.  Abdominal: Soft. He exhibits no distension. There is no tenderness. There is no rebound and no guarding.  Musculoskeletal: Normal range of motion. He exhibits no edema or tenderness.  Neurological: He is alert and oriented to person, place, and time.  Skin: Skin is warm and dry. No rash noted. No erythema.  Psychiatric: He has a normal mood and affect. His behavior is normal.  Nursing note and vitals reviewed.    ED Treatments / Results  Labs (all labs ordered are listed, but only abnormal results are displayed) Labs Reviewed  COMPREHENSIVE METABOLIC PANEL - Abnormal; Notable for the following:       Result Value   AST 47 (*)    All other components within normal limits  CBC - Abnormal; Notable for the following:    Platelets 98 (*)    All other components within normal limits  URINALYSIS, ROUTINE W REFLEX MICROSCOPIC - Abnormal; Notable for the following:    Color, Urine RED (*)    APPearance HAZY (*)    Glucose, UA 50 (*)    Hgb urine dipstick MODERATE (*)    Protein, ur 100 (*)    Bacteria, UA MANY  (*)    Squamous Epithelial / LPF 6-30 (*)    All other components within normal limits    EKG  EKG Interpretation None       Radiology No results found.  Procedures Procedures (including critical care time)  Medications Ordered in ED Medications - No data to display   Initial Impression / Assessment and Plan / ED Course  I have reviewed the triage vital signs and the nursing notes.  Pertinent labs & imaging results that were available during my care of the patient were reviewed by me and considered in my medical decision making (see chart for details).     Patient presenting today for gross hematuria. He has had some mild hesitation with urinating but has been able to empty his bladder fully. He denies any abdominal pain and is no acute distress on exam. He does take an 81 mg aspirin but no other anticoagulation. Concerned that patient may have hemorrhagic cystitis from prior radiation from prostate cancer. Lower suspicion for UTIs patient has no other symptoms such as fever or symptoms prior to 4 hours ago. However UA pending to ensure no infection. At this time patient is able to empty his bladder and only occasionally passing small clots. Discussed the option of placing a Foley catheter now or allowing the patient to go home and if he develops retention returning for catheter placement and bladder irrigation. Also recommended patient hold aspirin until speaking with urology on Monday.  6:16 PM No UTI and hematuria improving while being here.  Final Clinical Impressions(s) / ED Diagnoses   Final diagnoses:  Hematuria, unspecified type    New Prescriptions New Prescriptions   No medications on file     Blanchie Dessert, MD 08/15/16 1817

## 2016-08-15 NOTE — ED Notes (Signed)
Pt verbalized understanding of d/c instructions and has no further questions. Pt's urine has gotten clearer with every urination in the ER. Pt to follow up with urology on Monday. VSS, NAD.

## 2016-08-15 NOTE — ED Triage Notes (Signed)
The pt has had bloody urine today  No pain anywhere  He does  Not feel like he is emptying his bladder  And he goes sometime and cannot void

## 2016-08-15 NOTE — ED Notes (Signed)
Pt has been to the restroom twice now, the first time the pt could still see blood in his urine, the second time no blood visible.

## 2016-08-17 ENCOUNTER — Encounter (HOSPITAL_COMMUNITY): Payer: Self-pay

## 2016-08-19 ENCOUNTER — Encounter (HOSPITAL_COMMUNITY)
Admission: RE | Admit: 2016-08-19 | Discharge: 2016-08-19 | Disposition: A | Discharge status: Home / Self Care | Payer: Self-pay | Source: Ambulatory Visit | Attending: Cardiology | Admitting: Cardiology

## 2016-08-19 DIAGNOSIS — Z951 Presence of aortocoronary bypass graft: Secondary | ICD-10-CM | POA: Insufficient documentation

## 2016-08-21 ENCOUNTER — Encounter (HOSPITAL_COMMUNITY): Payer: Self-pay

## 2016-08-24 ENCOUNTER — Encounter (HOSPITAL_COMMUNITY)
Admission: RE | Admit: 2016-08-24 | Discharge: 2016-08-24 | Disposition: A | Discharge status: Home / Self Care | Payer: Self-pay | Source: Ambulatory Visit | Attending: Cardiology | Admitting: Cardiology

## 2016-08-26 ENCOUNTER — Encounter (HOSPITAL_COMMUNITY)
Admission: RE | Admit: 2016-08-26 | Discharge: 2016-08-26 | Disposition: A | Discharge status: Home / Self Care | Payer: Medicare HMO | Source: Ambulatory Visit | Attending: Cardiology | Admitting: Cardiology

## 2016-08-28 ENCOUNTER — Encounter (HOSPITAL_COMMUNITY): Payer: Self-pay

## 2016-08-31 ENCOUNTER — Encounter (HOSPITAL_COMMUNITY)
Admission: RE | Admit: 2016-08-31 | Discharge: 2016-08-31 | Disposition: A | Discharge status: Home / Self Care | Payer: Self-pay | Source: Ambulatory Visit | Attending: Cardiology | Admitting: Cardiology

## 2016-09-02 ENCOUNTER — Encounter (HOSPITAL_COMMUNITY)
Admission: RE | Admit: 2016-09-02 | Discharge: 2016-09-02 | Disposition: A | Discharge status: Home / Self Care | Payer: Self-pay | Source: Ambulatory Visit | Attending: Cardiology | Admitting: Cardiology

## 2016-09-04 ENCOUNTER — Encounter (HOSPITAL_COMMUNITY): Payer: Self-pay

## 2016-09-07 ENCOUNTER — Encounter (HOSPITAL_COMMUNITY): Payer: Self-pay

## 2016-09-09 ENCOUNTER — Encounter (HOSPITAL_COMMUNITY)
Admission: RE | Admit: 2016-09-09 | Discharge: 2016-09-09 | Disposition: A | Discharge status: Home / Self Care | Payer: Self-pay | Source: Ambulatory Visit | Attending: Cardiology | Admitting: Cardiology

## 2016-09-11 ENCOUNTER — Encounter (HOSPITAL_COMMUNITY)
Admission: RE | Admit: 2016-09-11 | Discharge: 2016-09-11 | Disposition: A | Discharge status: Home / Self Care | Payer: Self-pay | Source: Ambulatory Visit | Attending: Cardiology | Admitting: Cardiology

## 2016-09-16 ENCOUNTER — Encounter (HOSPITAL_COMMUNITY)
Admission: RE | Admit: 2016-09-16 | Discharge: 2016-09-16 | Disposition: A | Discharge status: Home / Self Care | Payer: Self-pay | Source: Ambulatory Visit | Attending: Cardiology | Admitting: Cardiology

## 2016-09-18 ENCOUNTER — Encounter (HOSPITAL_COMMUNITY)
Admission: RE | Admit: 2016-09-18 | Discharge: 2016-09-18 | Disposition: A | Discharge status: Home / Self Care | Payer: Self-pay | Source: Ambulatory Visit | Attending: Cardiology | Admitting: Cardiology

## 2016-09-18 DIAGNOSIS — Z951 Presence of aortocoronary bypass graft: Secondary | ICD-10-CM | POA: Insufficient documentation

## 2016-09-21 ENCOUNTER — Encounter (HOSPITAL_COMMUNITY): Payer: Self-pay

## 2016-09-23 ENCOUNTER — Encounter (HOSPITAL_COMMUNITY)
Admission: RE | Admit: 2016-09-23 | Discharge: 2016-09-23 | Disposition: A | Discharge status: Home / Self Care | Payer: Self-pay | Source: Ambulatory Visit | Attending: Cardiology | Admitting: Cardiology

## 2016-09-25 ENCOUNTER — Encounter (HOSPITAL_COMMUNITY): Payer: Self-pay

## 2016-09-28 ENCOUNTER — Encounter (HOSPITAL_COMMUNITY)
Admission: RE | Admit: 2016-09-28 | Discharge: 2016-09-28 | Disposition: A | Discharge status: Home / Self Care | Payer: Self-pay | Source: Ambulatory Visit | Attending: Cardiology | Admitting: Cardiology

## 2016-09-30 ENCOUNTER — Encounter (HOSPITAL_COMMUNITY): Payer: Self-pay

## 2016-10-02 ENCOUNTER — Encounter (HOSPITAL_COMMUNITY)
Admission: RE | Admit: 2016-10-02 | Discharge: 2016-10-02 | Disposition: A | Discharge status: Home / Self Care | Payer: Self-pay | Source: Ambulatory Visit | Attending: Cardiology | Admitting: Cardiology

## 2016-10-05 ENCOUNTER — Encounter (HOSPITAL_COMMUNITY)
Admission: RE | Admit: 2016-10-05 | Discharge: 2016-10-05 | Disposition: A | Discharge status: Home / Self Care | Payer: Self-pay | Source: Ambulatory Visit | Attending: Cardiology | Admitting: Cardiology

## 2016-10-07 ENCOUNTER — Encounter (HOSPITAL_COMMUNITY): Payer: Self-pay

## 2016-10-09 ENCOUNTER — Encounter (HOSPITAL_COMMUNITY): Payer: Self-pay

## 2016-10-12 ENCOUNTER — Encounter (HOSPITAL_COMMUNITY): Payer: Self-pay

## 2016-10-14 ENCOUNTER — Encounter (HOSPITAL_COMMUNITY)
Admission: RE | Admit: 2016-10-14 | Discharge: 2016-10-14 | Disposition: A | Discharge status: Home / Self Care | Payer: Self-pay | Source: Ambulatory Visit | Attending: Cardiology | Admitting: Cardiology

## 2016-10-16 ENCOUNTER — Encounter (HOSPITAL_COMMUNITY): Payer: Self-pay

## 2016-10-19 ENCOUNTER — Encounter (HOSPITAL_COMMUNITY)
Admission: RE | Admit: 2016-10-19 | Discharge: 2016-10-19 | Disposition: A | Discharge status: Home / Self Care | Payer: Self-pay | Source: Ambulatory Visit | Attending: Cardiology | Admitting: Cardiology

## 2016-10-19 DIAGNOSIS — Z48812 Encounter for surgical aftercare following surgery on the circulatory system: Secondary | ICD-10-CM | POA: Insufficient documentation

## 2016-10-19 DIAGNOSIS — Z951 Presence of aortocoronary bypass graft: Secondary | ICD-10-CM | POA: Insufficient documentation

## 2016-10-23 ENCOUNTER — Encounter (HOSPITAL_COMMUNITY): Payer: Self-pay

## 2016-10-26 ENCOUNTER — Encounter (HOSPITAL_COMMUNITY)
Admission: RE | Admit: 2016-10-26 | Discharge: 2016-10-26 | Disposition: A | Discharge status: Home / Self Care | Payer: Self-pay | Source: Ambulatory Visit | Attending: Cardiology | Admitting: Cardiology

## 2016-10-28 ENCOUNTER — Encounter (HOSPITAL_COMMUNITY)
Admission: RE | Admit: 2016-10-28 | Discharge: 2016-10-28 | Disposition: A | Discharge status: Home / Self Care | Payer: Self-pay | Source: Ambulatory Visit | Attending: Cardiology | Admitting: Cardiology

## 2016-10-30 ENCOUNTER — Encounter (HOSPITAL_COMMUNITY)
Admission: RE | Admit: 2016-10-30 | Discharge: 2016-10-30 | Disposition: A | Discharge status: Home / Self Care | Payer: Self-pay | Source: Ambulatory Visit | Attending: Cardiology | Admitting: Cardiology

## 2016-11-02 ENCOUNTER — Encounter (HOSPITAL_COMMUNITY)
Admission: RE | Admit: 2016-11-02 | Discharge: 2016-11-02 | Disposition: A | Discharge status: Home / Self Care | Payer: Self-pay | Source: Ambulatory Visit | Attending: Cardiology | Admitting: Cardiology

## 2016-11-04 ENCOUNTER — Encounter (HOSPITAL_COMMUNITY): Payer: Self-pay

## 2016-11-06 ENCOUNTER — Encounter (HOSPITAL_COMMUNITY): Payer: Self-pay

## 2016-11-09 ENCOUNTER — Encounter (HOSPITAL_COMMUNITY)
Admission: RE | Admit: 2016-11-09 | Discharge: 2016-11-09 | Disposition: A | Discharge status: Home / Self Care | Payer: Self-pay | Source: Ambulatory Visit | Attending: Cardiology | Admitting: Cardiology

## 2016-11-11 ENCOUNTER — Encounter (HOSPITAL_COMMUNITY): Payer: Self-pay

## 2016-11-13 ENCOUNTER — Encounter (HOSPITAL_COMMUNITY): Payer: Self-pay

## 2016-11-16 ENCOUNTER — Encounter (HOSPITAL_COMMUNITY): Payer: Self-pay

## 2016-11-18 ENCOUNTER — Encounter (HOSPITAL_COMMUNITY)
Admission: RE | Admit: 2016-11-18 | Discharge: 2016-11-18 | Disposition: A | Discharge status: Home / Self Care | Payer: Medicare HMO | Source: Ambulatory Visit | Attending: Cardiology | Admitting: Cardiology

## 2016-11-18 DIAGNOSIS — Z48812 Encounter for surgical aftercare following surgery on the circulatory system: Secondary | ICD-10-CM | POA: Insufficient documentation

## 2016-11-18 DIAGNOSIS — Z951 Presence of aortocoronary bypass graft: Secondary | ICD-10-CM | POA: Insufficient documentation

## 2016-11-20 ENCOUNTER — Encounter (HOSPITAL_COMMUNITY)
Admission: RE | Admit: 2016-11-20 | Discharge: 2016-11-20 | Disposition: A | Discharge status: Home / Self Care | Payer: Medicare HMO | Source: Ambulatory Visit | Attending: Cardiology | Admitting: Cardiology

## 2016-11-23 ENCOUNTER — Encounter (HOSPITAL_COMMUNITY): Payer: Medicare HMO

## 2016-11-25 ENCOUNTER — Encounter (HOSPITAL_COMMUNITY)
Admission: RE | Admit: 2016-11-25 | Discharge: 2016-11-25 | Disposition: A | Discharge status: Home / Self Care | Payer: Medicare HMO | Source: Ambulatory Visit | Attending: Cardiology | Admitting: Cardiology

## 2016-11-27 ENCOUNTER — Encounter (HOSPITAL_COMMUNITY)
Admission: RE | Admit: 2016-11-27 | Discharge: 2016-11-27 | Disposition: A | Discharge status: Home / Self Care | Payer: Medicare HMO | Source: Ambulatory Visit | Attending: Cardiology | Admitting: Cardiology

## 2016-11-30 ENCOUNTER — Encounter (HOSPITAL_COMMUNITY)
Admission: RE | Admit: 2016-11-30 | Discharge: 2016-11-30 | Disposition: A | Discharge status: Home / Self Care | Payer: Medicare HMO | Source: Ambulatory Visit | Attending: Cardiology | Admitting: Cardiology

## 2016-12-02 ENCOUNTER — Encounter (HOSPITAL_COMMUNITY): Payer: Medicare HMO

## 2016-12-04 ENCOUNTER — Encounter (HOSPITAL_COMMUNITY)
Admission: RE | Admit: 2016-12-04 | Discharge: 2016-12-04 | Disposition: A | Discharge status: Home / Self Care | Payer: Medicare HMO | Source: Ambulatory Visit | Attending: Cardiology | Admitting: Cardiology

## 2016-12-07 ENCOUNTER — Encounter (HOSPITAL_COMMUNITY)
Admission: RE | Admit: 2016-12-07 | Discharge: 2016-12-07 | Disposition: A | Discharge status: Home / Self Care | Payer: Medicare HMO | Source: Ambulatory Visit | Attending: Cardiology | Admitting: Cardiology

## 2016-12-09 ENCOUNTER — Encounter (HOSPITAL_COMMUNITY)
Admission: RE | Admit: 2016-12-09 | Discharge: 2016-12-09 | Disposition: A | Discharge status: Home / Self Care | Payer: Medicare HMO | Source: Ambulatory Visit | Attending: Cardiology | Admitting: Cardiology

## 2016-12-11 ENCOUNTER — Encounter (HOSPITAL_COMMUNITY)
Admission: RE | Admit: 2016-12-11 | Discharge: 2016-12-11 | Disposition: A | Discharge status: Home / Self Care | Payer: Medicare HMO | Source: Ambulatory Visit | Attending: Cardiology | Admitting: Cardiology

## 2016-12-14 ENCOUNTER — Encounter (HOSPITAL_COMMUNITY): Payer: Medicare HMO

## 2016-12-16 ENCOUNTER — Ambulatory Visit (INDEPENDENT_AMBULATORY_CARE_PROVIDER_SITE_OTHER): Payer: Medicare HMO | Admitting: Cardiology

## 2016-12-16 ENCOUNTER — Encounter: Payer: Self-pay | Admitting: Cardiology

## 2016-12-16 ENCOUNTER — Encounter (HOSPITAL_COMMUNITY): Payer: Medicare HMO

## 2016-12-16 VITALS — BP 110/74 | HR 74 | Ht 72.0 in | Wt 198.0 lb

## 2016-12-16 DIAGNOSIS — E785 Hyperlipidemia, unspecified: Secondary | ICD-10-CM

## 2016-12-16 DIAGNOSIS — I10 Essential (primary) hypertension: Secondary | ICD-10-CM | POA: Diagnosis not present

## 2016-12-16 DIAGNOSIS — I25119 Atherosclerotic heart disease of native coronary artery with unspecified angina pectoris: Secondary | ICD-10-CM

## 2016-12-16 DIAGNOSIS — Z951 Presence of aortocoronary bypass graft: Secondary | ICD-10-CM | POA: Diagnosis not present

## 2016-12-16 NOTE — Patient Instructions (Signed)
No change with current medications  may use  Advil or motrin as needed( do not use every day  ), if you use medication do not take aspirin that day.     Your physician wants you to follow-up in 6 month with Dr Ellyn Hack. You will receive a reminder letter in the mail two months in advance. If you don't receive a letter, please call our office to schedule the follow-up appointment.    If you need a refill on your cardiac medications before your next appointment, please call your pharmacy.

## 2016-12-16 NOTE — Progress Notes (Signed)
PCP: Chesley Noon, MD  Clinic Note: Chief Complaint  Patient presents with  . Follow-up    NO chest pain, shortness of breath, edema, pain or cramping in legs, lightheaded or dizziness  . Coronary Artery Disease    wants to know whats safe to take with other meds     HPI: Robert Boyle is a 81 y.o. male with a PMH below who presents today for Six-month follow-up for CAD-CABG. Postop CABG complicated by large pulmonary embolus. Has completed one year for anticoagulation. Has been intolerant of increased dose of beta blockers. Now on low-dose beta blocker. He had a very difficult several mont to recover from CABG and his PE, he had difficulty w mmHg levels as well as some depression issues. He is now doing very well. Continues to attend cardiac rehabilitation maintenance program doing quite well.  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 He is in great . He is looking forward to going to his 60th  High school reunion. He still does his Cardiac Rehab routine workouts. Unfortunately he missed a, having recently pulled a muscle in his back. He is now up to walking about 4 miles a day.  He is doing very well with no cardiac complaints. His blood pressures  Have been more stable. His lipids are being followed by his PCP. He states that his energy level has been outstanding. He denies any chest tightness or pressure or dyspnea with rest or exertion.  No heart failure symptoms of PND, orthopnea or edema. No palpitations, lightheadedness, dizziness, weakness or syncope/near syncope. No TIA/amaurosis fugax symptoms. No melena, hematochezia, hematuria, or epstaxis. No claudication.  ROS: A comprehensive was performed. Review of Systems  Musculoskeletal: Positive for back pain (he  pulled muscle in the right side of his back earlier this week) and joint pain (routine arthritis pains).  Neurological: Positive for dizziness (only occasionally when he bends down quickly).  Psychiatric/Behavioral: Negative for depression and memory loss. The patient is not nervous/anxious.   All other systems reviewed and are negative.   I have reviewed and (if needed) personally updated the patient's problem list, medications, allergies, past medical and surgical history, social and family history.   Past Medical History:  Diagnosis Date  . Acute pulmonary embolus ([place]) 04/13/14   CTA: Bilateral Main PAs; suggestion of increased RV strain & R basal pulmonary infarct,  Mild dilation of the ascending aorta 2.8 cm noted. --> On Xarelto  . Arthritis   . Arthritis   . CAD S/P percutaneous coronary angioplasty 2000, 2001, 03/2014   a. 2000: BMS to D1 75% stenosis (~2.5 mm x 18 mm AVE BMS); b. 2001 ISR of D1 stent - PTCA w/ 2.5 mm Balloon;; c. Myoview 11/'15: Intermediate Risk, inferior-inferolateral ischemia --> Cath: 40% proximal LAD with 90% mid; 90% mid D1 and D2; 100% proximal RCA with left-to-right collaterals for both PL and PDA --> CABG  d. CABG 03/2014: LIMA-LAD, SVG-D1-D2, and SVG sequentially to PDA and PLB.  . Essential hypertension   . Gastrointestinal complaints   . GERD (gastroesophageal reflux disease)   . Gout   . H/O renal calculi   . History of radiation therapy 09/06/13- 11/02/13   prostate 7600 cGy 40 sessions, seminal vesicles 5600 cGy 40 sessions  . Hyperlipidemia with target LDL less than  70   . Prostate cancer ([place]) 05/23/13   gleason 4+3=7, vloume 34.32 cc; s/p Chemo-XRT  . 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 Shortness of breath dyspnea     Past Surgical History:  Procedure Laterality Date  . BACK SURGERY  1995   lumbar  . Carotid & Upper Extr. Ultrasound  July 2016   Mild Bilateral fibrous plaque in ICAs, normal Vertebral flow, No  SCA stenosis  . CHOLECYSTECTOMY  01/22/10  . CORONARY ANGIOPLASTY  07/21/1999   PTCA to ISR stenosis to LAD  . CORONARY ANGIOPLASTY WITH STENT PLACEMENT  10/17/1998   PCI with ~2.5 mm x 18 mm AVE BMS to D1  . CORONARY ARTERY BYPASS GRAFT N/A 03/21/2014   Procedure: CORONARY ARTERY BYPASS GRAFTING (CABG) TIMES FIVE USING LEFT INTERNAL MAMMARY TO LAD, SAPHENOUS VEIN GRAFT TO THE PD/PL, SAPHENOUS VEIN GRAFT TO DIAGONAL 1 AND 2 SEQUENTIALLY, SAPHENOUS VEIN HARVESTED ENDOSCOPICALLY;  Surgeon: Grace Isaac, MD;  Location: [facility];  Service: Open Heart Surgery;  Laterality: N/A;  . LEFT HEART CATHETERIZATION WITH CORONARY ANGIOGRAM N/A 03/20/2014   Procedure: LEFT HEART CATHETERIZATION WITH CORONARY ANGIOGRAM;  Surgeon: Leonie Man, MD;  Location[facility];  Service: Cardiovascular;  Laterality: N/A;  . NM MYOCAR PERF WALL MOTION  11/06/2011   low risk 63% ; FIXED INFEROSEPTAL GUT ATTENUATION  . PROSTATE BIOPSY  05/23/13   gleason 4+3=7, vol 34.32 cc  . TEE WITHOUT CARDIOVERSION N/A 03/21/2014   Procedure: TRANSESOPHAGEAL ECHOCARDIOGRAM (TEE);  Surgeon: Grace Isaac, MD;  Location: [facility];  Service: Open Heart Surgery;  Laterality: N/A;  . TRANSTHORACIC ECHOCARDIOGRAM  Mar 04, 2014   EF 55-60%, Gr 1 DD.  Mildly dilated Ascending Aorta; RV now back to normal    Current Meds  Medication Sig  . allopurinol (ZYLOPRIM) 300 MG tablet Take 300 mg by mouth daily.  Marland Kitchen amLODipine (NORVASC) 10 MG tablet Take 1 tablet (10 mg total) by mouth daily.  Marland Kitchen aspirin EC 81 MG EC tablet Take 1 tablet (81 mg total) by mouth daily.  . celecoxib (CELEBREX) 200 MG capsule Take 200 mg by mouth daily.  Marland Kitchen losartan (COZAAR) 100 MG tablet Take 1 tablet (100 mg total) by mouth daily.  . metoprolol tartrate (LOPRESSOR) 25 MG tablet Take 0.5 tablets (12.5 mg total) by mouth 2 (two) times daily.  Marland Kitchen omeprazole (PRILOSEC) 40 MG capsule Take 40 mg by mouth daily.  . rosuvastatin (CRESTOR) 20 MG tablet Take 20 mg by mouth daily.      No Known Allergies  Social History   Social History  . Marital status: Single    Spouse name: N/A  . Number of children: N/A  . Years of education: N/A   Social History Main Topics  . Smoking status: Former Smoker    Packs/day: 2.00    Years: 25.00    Quit date: 04/20/1983  . Smokeless tobacco: Never Used  . Alcohol use 0.0 oz/week     Comment: 3 a day  . Drug use: No  . Sexual activity: Not Asked   Other Topics Concern  . None   Social History Narrative   Retired.    Divorced, but lives with his ex-wife.   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 No tobacco products.   Plays golf with his friends and sons.    family history includes Cancer in his brother; Heart attack in his brother and father; Stroke in his mother.  Wt  Readings from Last 3 Encounters:  12/16/16 198 lb (89.8 kg)  08/15/16 200 lb (90.7 kg)  06/03/16 198 lb (89.8 kg)    PHYSICAL EXAM BP 110/74   Pulse 74   Ht 6' (1.829 m)   Wt 198 lb (89.8 kg)   BMI 26.85 kg/m  Physical Exam  Constitutional: He is oriented to person, place, and time. He appears well-developed and well-nourished. No distress.  HENT:  Head: Normocephalic and atraumatic.  Eyes: Pupils are equal, round, and reactive to light. EOM are normal.  Neck: Normal range of motion. Neck supple. No hepatojugular reflux and no JVD present. Carotid bruit is not present.  Cardiovascular: Normal rate, regular rhythm, S1 normal and intact distal pulses.  Exam reveals no gallop and no friction rub.   No murmur heard. Split S2  Pulmonary/Chest: Effort normal and breath sounds normal. No respiratory distress. He has no wheezes. He has no rales.  Abdominal: Soft. Bowel sounds are normal. He exhibits no distension. There is no tenderness. There is no rebound and no guarding.  Mild truncal obesity  Neurological: He is alert and oriented to person, place, and time.  Skin: Skin is warm and dry. No rash noted. He is not diaphoretic. No erythema. No  pallor.  Psychiatric: He has a normal mood and affect. His behavior is normal. Judgment and thought content normal.  he is overall in very good spirits. smiling     Adult ECG Report Not checked  Other studies Reviewed: Additional studies/ records that were reviewed today include:  Recent Labs:    February 2018: TC 177, TG 109, HDL 90, LDL 65  ASSESSMENT / PLAN: Problem List Items Addressed This Visit    Essential hypertension (Chronic)    Excellent control today having increasing amlodipine to10 mg on last visit. He is also now using twice a day metoprolol 12.5 mg.   The combination of having increased his amlodipine to 10 mg daily and losartan to 100 mg daily seems to have finally achieved a good blood pressure control.      Hyperlipidemia with target LDL less than 70 (Chronic)    Lipids have been monitored by his PCP. Most recent check in February looked great.  Continue Crestor / rosuvastatin      MV CAD - Unstable Angina --> CABG x 5; now Stable w/o Angina - Primary (Chronic)     He is still doing very well, continuing with his cardiac rehabilitation maintenance program. He Denies any recurrent anginal symptoms with rest or exertion. He is on stable dose of beta blocker  He is now taking twice a day. Unable to further titrate in the past. We will simply continue current dosage given his stable blood pressure and heart rate. He is on rosuvastatin and amlodipine   On aspirin I discussed recommended that if he takes his when necessary NSAIDs, he does this in lieu of his aspirin dose.      S/P CABG x 5: LIMA-LAD, SVG-D1-D2, SVG-RPDA-RPL (Chronic)    Finally, after a very prolonged recovery period, he is now doing quite well. He is now close to 3 years postop. Would be due for follow-up Myoview the end of next year.         Current medicines are reviewed at length with the patient today. (+/- concerns) he asked if it is okay to take when necess The following changes have  been made: - okay as long as he does not take it every day.  If he  takes an NSAID, he should not take aspirin a day  Patient Instructions  No change with current medications  may use  Advil or motrin as needed( do not use every day  ), if you use medication do not take aspirin that day.     Your physician wants you to follow-up in 6 month with Dr Ellyn Hack. You will receive a reminder letter in the mail two months in advance. If you don't receive a letter, please call our office to schedule the follow-up appointment.    If you need a refill on your cardiac medications before your next appointment, please call your pharmacy.    Studies Ordered:   No orders of the defined types were placed in this encounter.     Glenetta Hew, M.D., M.S. Interventional Cardiologist   Pager # [phone] Phone # [phone] [address] [place], [place] [address]

## 2016-12-18 ENCOUNTER — Encounter (HOSPITAL_COMMUNITY): Payer: Medicare HMO

## 2016-12-19 ENCOUNTER — Encounter: Payer: Self-pay | Admitting: Cardiology

## 2016-12-19 NOTE — Assessment & Plan Note (Signed)
Excellent control today having increasing amlodipine to10 mg on last visit. He is also now using twice a day metoprolol 12.5 mg.   The combination of having increased his amlodipine to 10 mg daily and losartan to 100 mg daily seems to have finally achieved a good blood pressure control.

## 2016-12-19 NOTE — Assessment & Plan Note (Signed)
Finally, after a very prolonged recovery period, he is now doing quite well. He is now close to 3 years postop. Would be due for follow-up Myoview the end of next year.

## 2016-12-19 NOTE — Assessment & Plan Note (Signed)
He is still doing very well, continuing with his cardiac rehabilitation maintenance program. He Denies any recurrent anginal symptoms with rest or exertion. He is on stable dose of beta blocker  He is now taking twice a day. Unable to further titrate in the past. We will simply continue current dosage given his stable blood pressure and heart rate. He is on rosuvastatin and amlodipine   On aspirin I discussed recommended that if he takes his when necessary NSAIDs, he does this in lieu of his aspirin dose.

## 2016-12-19 NOTE — Assessment & Plan Note (Signed)
Lipids have been monitored by his PCP. Most recent check in February looked great.  Continue Crestor / rosuvastatin

## 2016-12-23 ENCOUNTER — Encounter (HOSPITAL_COMMUNITY)
Admission: RE | Admit: 2016-12-23 | Discharge: 2016-12-23 | Disposition: A | Discharge status: Home / Self Care | Payer: Medicare HMO | Source: Ambulatory Visit | Attending: Cardiology | Admitting: Cardiology

## 2016-12-23 DIAGNOSIS — Z951 Presence of aortocoronary bypass graft: Secondary | ICD-10-CM | POA: Insufficient documentation

## 2016-12-23 DIAGNOSIS — Z48812 Encounter for surgical aftercare following surgery on the circulatory system: Secondary | ICD-10-CM | POA: Insufficient documentation

## 2016-12-25 ENCOUNTER — Encounter (HOSPITAL_COMMUNITY)
Admission: RE | Admit: 2016-12-25 | Discharge: 2016-12-25 | Disposition: A | Discharge status: Home / Self Care | Payer: Medicare HMO | Source: Ambulatory Visit | Attending: Cardiology | Admitting: Cardiology

## 2016-12-28 ENCOUNTER — Encounter (HOSPITAL_COMMUNITY): Payer: Medicare HMO

## 2016-12-30 ENCOUNTER — Encounter (HOSPITAL_COMMUNITY): Payer: Medicare HMO

## 2017-01-01 ENCOUNTER — Encounter (HOSPITAL_COMMUNITY): Payer: Medicare HMO

## 2017-01-04 ENCOUNTER — Encounter (HOSPITAL_COMMUNITY)
Admission: RE | Admit: 2017-01-04 | Discharge: 2017-01-04 | Disposition: A | Discharge status: Home / Self Care | Payer: Medicare HMO | Source: Ambulatory Visit | Attending: Cardiology | Admitting: Cardiology

## 2017-01-06 ENCOUNTER — Encounter (HOSPITAL_COMMUNITY): Payer: Medicare HMO

## 2017-01-08 ENCOUNTER — Encounter (HOSPITAL_COMMUNITY)
Admission: RE | Admit: 2017-01-08 | Discharge: 2017-01-08 | Disposition: A | Discharge status: Home / Self Care | Payer: Medicare HMO | Source: Ambulatory Visit | Attending: Cardiology | Admitting: Cardiology

## 2017-01-11 ENCOUNTER — Encounter (HOSPITAL_COMMUNITY): Payer: Medicare HMO

## 2017-01-13 ENCOUNTER — Encounter (HOSPITAL_COMMUNITY)
Admission: RE | Admit: 2017-01-13 | Discharge: 2017-01-13 | Disposition: A | Discharge status: Home / Self Care | Payer: Medicare HMO | Source: Ambulatory Visit | Attending: Cardiology | Admitting: Cardiology

## 2017-01-15 ENCOUNTER — Encounter (HOSPITAL_COMMUNITY): Payer: Medicare HMO

## 2017-01-18 ENCOUNTER — Encounter (HOSPITAL_COMMUNITY): Payer: Medicare HMO

## 2017-01-18 DIAGNOSIS — Z951 Presence of aortocoronary bypass graft: Secondary | ICD-10-CM | POA: Insufficient documentation

## 2017-01-18 DIAGNOSIS — Z48812 Encounter for surgical aftercare following surgery on the circulatory system: Secondary | ICD-10-CM | POA: Insufficient documentation

## 2017-01-20 ENCOUNTER — Encounter (HOSPITAL_COMMUNITY)
Admission: RE | Admit: 2017-01-20 | Discharge: 2017-01-20 | Disposition: A | Discharge status: Home / Self Care | Payer: Medicare HMO | Source: Ambulatory Visit | Attending: Cardiology | Admitting: Cardiology

## 2017-01-22 ENCOUNTER — Encounter (HOSPITAL_COMMUNITY): Payer: Medicare HMO

## 2017-01-25 ENCOUNTER — Encounter (HOSPITAL_COMMUNITY): Payer: Medicare HMO

## 2017-01-27 ENCOUNTER — Encounter (HOSPITAL_COMMUNITY)
Admission: RE | Admit: 2017-01-27 | Discharge: 2017-01-27 | Disposition: A | Discharge status: Home / Self Care | Payer: Medicare HMO | Source: Ambulatory Visit | Attending: Cardiology | Admitting: Cardiology

## 2017-01-29 ENCOUNTER — Encounter (HOSPITAL_COMMUNITY): Payer: Medicare HMO

## 2017-02-01 ENCOUNTER — Encounter (HOSPITAL_COMMUNITY): Payer: Medicare HMO

## 2017-02-03 ENCOUNTER — Encounter (HOSPITAL_COMMUNITY)
Admission: RE | Admit: 2017-02-03 | Discharge: 2017-02-03 | Disposition: A | Discharge status: Home / Self Care | Payer: Medicare HMO | Source: Ambulatory Visit | Attending: Cardiology | Admitting: Cardiology

## 2017-02-05 ENCOUNTER — Encounter (HOSPITAL_COMMUNITY): Payer: Medicare HMO

## 2017-02-08 ENCOUNTER — Encounter (HOSPITAL_COMMUNITY)
Admission: RE | Admit: 2017-02-08 | Discharge: 2017-02-08 | Disposition: A | Discharge status: Home / Self Care | Payer: Medicare HMO | Source: Ambulatory Visit | Attending: Cardiology | Admitting: Cardiology

## 2017-02-10 ENCOUNTER — Encounter (HOSPITAL_COMMUNITY)
Admission: RE | Admit: 2017-02-10 | Discharge: 2017-02-10 | Disposition: A | Discharge status: Home / Self Care | Payer: Medicare HMO | Source: Ambulatory Visit | Attending: Cardiology | Admitting: Cardiology

## 2017-02-12 ENCOUNTER — Encounter (HOSPITAL_COMMUNITY): Payer: Medicare HMO

## 2017-02-15 ENCOUNTER — Encounter (HOSPITAL_COMMUNITY)
Admission: RE | Admit: 2017-02-15 | Discharge: 2017-02-15 | Disposition: A | Discharge status: Home / Self Care | Payer: Medicare HMO | Source: Ambulatory Visit | Attending: Cardiology | Admitting: Cardiology

## 2017-02-17 ENCOUNTER — Encounter (HOSPITAL_COMMUNITY): Payer: Medicare HMO

## 2017-02-19 ENCOUNTER — Encounter (HOSPITAL_COMMUNITY)
Admission: RE | Admit: 2017-02-19 | Discharge: 2017-02-19 | Disposition: A | Discharge status: Home / Self Care | Payer: Medicare HMO | Source: Ambulatory Visit | Attending: Cardiology | Admitting: Cardiology

## 2017-02-19 DIAGNOSIS — Z48812 Encounter for surgical aftercare following surgery on the circulatory system: Secondary | ICD-10-CM | POA: Insufficient documentation

## 2017-02-19 DIAGNOSIS — Z951 Presence of aortocoronary bypass graft: Secondary | ICD-10-CM | POA: Diagnosis not present

## 2017-02-22 ENCOUNTER — Encounter (HOSPITAL_COMMUNITY): Payer: Medicare HMO

## 2017-02-24 ENCOUNTER — Encounter (HOSPITAL_COMMUNITY)
Admission: RE | Admit: 2017-02-24 | Discharge: 2017-02-24 | Disposition: A | Discharge status: Home / Self Care | Payer: Medicare HMO | Source: Ambulatory Visit | Attending: Cardiology | Admitting: Cardiology

## 2017-02-26 ENCOUNTER — Encounter (HOSPITAL_COMMUNITY): Payer: Medicare HMO

## 2017-03-01 ENCOUNTER — Encounter (HOSPITAL_COMMUNITY)
Admission: RE | Admit: 2017-03-01 | Discharge: 2017-03-01 | Disposition: A | Discharge status: Home / Self Care | Payer: Medicare HMO | Source: Ambulatory Visit | Attending: Cardiology | Admitting: Cardiology

## 2017-03-03 ENCOUNTER — Encounter (HOSPITAL_COMMUNITY)
Admission: RE | Admit: 2017-03-03 | Discharge: 2017-03-03 | Disposition: A | Discharge status: Home / Self Care | Payer: Medicare HMO | Source: Ambulatory Visit | Attending: Cardiology | Admitting: Cardiology

## 2017-03-05 ENCOUNTER — Encounter (HOSPITAL_COMMUNITY): Payer: Medicare HMO

## 2017-03-08 ENCOUNTER — Encounter (HOSPITAL_COMMUNITY): Payer: Medicare HMO

## 2017-03-10 ENCOUNTER — Encounter (HOSPITAL_COMMUNITY): Payer: Medicare HMO

## 2017-03-15 ENCOUNTER — Encounter (HOSPITAL_COMMUNITY)
Admission: RE | Admit: 2017-03-15 | Discharge: 2017-03-15 | Disposition: A | Discharge status: Home / Self Care | Payer: Medicare HMO | Source: Ambulatory Visit | Attending: Cardiology | Admitting: Cardiology

## 2017-03-17 ENCOUNTER — Encounter (HOSPITAL_COMMUNITY)
Admission: RE | Admit: 2017-03-17 | Discharge: 2017-03-17 | Disposition: A | Discharge status: Home / Self Care | Payer: Medicare HMO | Source: Ambulatory Visit | Attending: Cardiology | Admitting: Cardiology

## 2017-03-19 ENCOUNTER — Encounter (HOSPITAL_COMMUNITY): Payer: Medicare HMO

## 2017-03-26 ENCOUNTER — Encounter (HOSPITAL_COMMUNITY)
Admission: RE | Admit: 2017-03-26 | Discharge: 2017-03-26 | Disposition: A | Discharge status: Home / Self Care | Payer: Self-pay | Source: Ambulatory Visit | Attending: Cardiology | Admitting: Cardiology

## 2017-03-26 DIAGNOSIS — Z951 Presence of aortocoronary bypass graft: Secondary | ICD-10-CM | POA: Insufficient documentation

## 2017-03-26 DIAGNOSIS — Z48812 Encounter for surgical aftercare following surgery on the circulatory system: Secondary | ICD-10-CM | POA: Insufficient documentation

## 2017-03-31 ENCOUNTER — Encounter (HOSPITAL_COMMUNITY): Payer: Self-pay

## 2017-04-02 ENCOUNTER — Encounter (HOSPITAL_COMMUNITY): Payer: Self-pay

## 2017-04-05 ENCOUNTER — Encounter (HOSPITAL_COMMUNITY)
Admission: RE | Admit: 2017-04-05 | Discharge: 2017-04-05 | Disposition: A | Discharge status: Home / Self Care | Payer: Self-pay | Source: Ambulatory Visit | Attending: Cardiology | Admitting: Cardiology

## 2017-04-07 ENCOUNTER — Encounter (HOSPITAL_COMMUNITY): Payer: Self-pay

## 2017-04-09 ENCOUNTER — Encounter (HOSPITAL_COMMUNITY): Payer: Self-pay

## 2017-04-14 ENCOUNTER — Encounter (HOSPITAL_COMMUNITY)
Admission: RE | Admit: 2017-04-14 | Discharge: 2017-04-14 | Disposition: A | Discharge status: Home / Self Care | Payer: Self-pay | Source: Ambulatory Visit | Attending: Cardiology | Admitting: Cardiology

## 2017-04-16 ENCOUNTER — Encounter (HOSPITAL_COMMUNITY): Payer: Self-pay

## 2017-04-19 ENCOUNTER — Encounter (HOSPITAL_COMMUNITY): Payer: Self-pay

## 2017-04-21 ENCOUNTER — Encounter (HOSPITAL_COMMUNITY)
Admission: RE | Admit: 2017-04-21 | Discharge: 2017-04-21 | Disposition: A | Discharge status: Home / Self Care | Payer: Self-pay | Source: Ambulatory Visit | Attending: Cardiology | Admitting: Cardiology

## 2017-04-21 DIAGNOSIS — Z951 Presence of aortocoronary bypass graft: Secondary | ICD-10-CM | POA: Insufficient documentation

## 2017-04-21 DIAGNOSIS — Z48812 Encounter for surgical aftercare following surgery on the circulatory system: Secondary | ICD-10-CM | POA: Insufficient documentation

## 2017-04-23 ENCOUNTER — Encounter (HOSPITAL_COMMUNITY)
Admission: RE | Admit: 2017-04-23 | Discharge: 2017-04-23 | Disposition: A | Discharge status: Home / Self Care | Payer: Self-pay | Source: Ambulatory Visit | Attending: Cardiology | Admitting: Cardiology

## 2017-04-26 ENCOUNTER — Encounter (HOSPITAL_COMMUNITY)
Admission: RE | Admit: 2017-04-26 | Discharge: 2017-04-26 | Disposition: A | Discharge status: Home / Self Care | Payer: Self-pay | Source: Ambulatory Visit | Attending: Cardiology | Admitting: Cardiology

## 2017-04-28 ENCOUNTER — Encounter (HOSPITAL_COMMUNITY)
Admission: RE | Admit: 2017-04-28 | Discharge: 2017-04-28 | Disposition: A | Discharge status: Home / Self Care | Payer: Self-pay | Source: Ambulatory Visit | Attending: Cardiology | Admitting: Cardiology

## 2017-04-30 ENCOUNTER — Encounter (HOSPITAL_COMMUNITY)
Admission: RE | Admit: 2017-04-30 | Discharge: 2017-04-30 | Disposition: A | Discharge status: Home / Self Care | Payer: Medicare HMO | Source: Ambulatory Visit | Attending: Cardiology | Admitting: Cardiology

## 2017-05-03 ENCOUNTER — Encounter (HOSPITAL_COMMUNITY): Payer: Self-pay

## 2017-05-05 ENCOUNTER — Encounter (HOSPITAL_COMMUNITY): Payer: Self-pay

## 2017-05-07 ENCOUNTER — Encounter (HOSPITAL_COMMUNITY)
Admission: RE | Admit: 2017-05-07 | Discharge: 2017-05-07 | Disposition: A | Discharge status: Home / Self Care | Payer: Self-pay | Source: Ambulatory Visit | Attending: Cardiology | Admitting: Cardiology

## 2017-05-10 ENCOUNTER — Encounter (HOSPITAL_COMMUNITY): Payer: Self-pay

## 2017-05-12 ENCOUNTER — Encounter (HOSPITAL_COMMUNITY)
Admission: RE | Admit: 2017-05-12 | Discharge: 2017-05-12 | Disposition: A | Discharge status: Home / Self Care | Payer: Self-pay | Source: Ambulatory Visit | Attending: Cardiology | Admitting: Cardiology

## 2017-05-14 ENCOUNTER — Encounter (HOSPITAL_COMMUNITY)
Admission: RE | Admit: 2017-05-14 | Discharge: 2017-05-14 | Disposition: A | Discharge status: Home / Self Care | Payer: Self-pay | Source: Ambulatory Visit | Attending: Cardiology | Admitting: Cardiology

## 2017-05-17 ENCOUNTER — Encounter (HOSPITAL_COMMUNITY)
Admission: RE | Admit: 2017-05-17 | Discharge: 2017-05-17 | Disposition: A | Discharge status: Home / Self Care | Payer: Self-pay | Source: Ambulatory Visit | Attending: Cardiology | Admitting: Cardiology

## 2017-05-19 ENCOUNTER — Encounter (HOSPITAL_COMMUNITY): Payer: Self-pay

## 2017-05-21 ENCOUNTER — Encounter (HOSPITAL_COMMUNITY)
Admission: RE | Admit: 2017-05-21 | Discharge: 2017-05-21 | Disposition: A | Discharge status: Home / Self Care | Payer: Self-pay | Source: Ambulatory Visit | Attending: Cardiology | Admitting: Cardiology

## 2017-05-21 DIAGNOSIS — Z48812 Encounter for surgical aftercare following surgery on the circulatory system: Secondary | ICD-10-CM | POA: Insufficient documentation

## 2017-05-21 DIAGNOSIS — Z951 Presence of aortocoronary bypass graft: Secondary | ICD-10-CM | POA: Insufficient documentation

## 2017-05-24 ENCOUNTER — Encounter (HOSPITAL_COMMUNITY)
Admission: RE | Admit: 2017-05-24 | Discharge: 2017-05-24 | Disposition: A | Discharge status: Home / Self Care | Payer: Self-pay | Source: Ambulatory Visit | Attending: Cardiology | Admitting: Cardiology

## 2017-05-24 ENCOUNTER — Telehealth: Payer: Self-pay | Admitting: Cardiology

## 2017-05-24 MED ORDER — LOSARTAN POTASSIUM 100 MG PO TABS: 100.0000 mg | ORAL_TABLET | Freq: Every day | ORAL | 1 refills | Status: DC

## 2017-05-24 NOTE — Telephone Encounter (Signed)
New message   *STAT* If patient is at the pharmacy, call can be transferred to refill team.   1. Which medications need to be refilled? (please list name of each medication and dose if known) losartan (COZAAR) 100 MG tablet  2. Which pharmacy/location (including street and city if local pharmacy) is medication to be sent to? [place], [place] [phone] (Phone) [phone] (Fax)   3. Do they need a 30 day or 90 day supply? Robert Boyle

## 2017-05-24 NOTE — Telephone Encounter (Signed)
Pt hung up

## 2017-05-24 NOTE — Telephone Encounter (Signed)
Rx(s) sent to pharmacy electronically.  

## 2017-05-26 ENCOUNTER — Encounter (HOSPITAL_COMMUNITY)
Admission: RE | Admit: 2017-05-26 | Discharge: 2017-05-26 | Disposition: A | Discharge status: Home / Self Care | Payer: Self-pay | Source: Ambulatory Visit | Attending: Cardiology | Admitting: Cardiology

## 2017-05-28 ENCOUNTER — Encounter (HOSPITAL_COMMUNITY)
Admission: RE | Admit: 2017-05-28 | Discharge: 2017-05-28 | Disposition: A | Discharge status: Home / Self Care | Payer: Self-pay | Source: Ambulatory Visit | Attending: Cardiology | Admitting: Cardiology

## 2017-05-31 ENCOUNTER — Encounter (HOSPITAL_COMMUNITY): Payer: Self-pay

## 2017-06-02 ENCOUNTER — Encounter (HOSPITAL_COMMUNITY)
Admission: RE | Admit: 2017-06-02 | Discharge: 2017-06-02 | Disposition: A | Discharge status: Home / Self Care | Payer: Self-pay | Source: Ambulatory Visit | Attending: Cardiology | Admitting: Cardiology

## 2017-06-04 ENCOUNTER — Encounter (HOSPITAL_COMMUNITY): Payer: Self-pay

## 2017-06-07 ENCOUNTER — Encounter (HOSPITAL_COMMUNITY)
Admission: RE | Admit: 2017-06-07 | Discharge: 2017-06-07 | Disposition: A | Discharge status: Home / Self Care | Payer: Self-pay | Source: Ambulatory Visit | Attending: Cardiology | Admitting: Cardiology

## 2017-06-09 ENCOUNTER — Encounter (HOSPITAL_COMMUNITY): Payer: Self-pay

## 2017-06-11 ENCOUNTER — Encounter (HOSPITAL_COMMUNITY): Payer: Self-pay

## 2017-06-14 ENCOUNTER — Encounter (HOSPITAL_COMMUNITY)
Admission: RE | Admit: 2017-06-14 | Discharge: 2017-06-14 | Disposition: A | Discharge status: Home / Self Care | Payer: Self-pay | Source: Ambulatory Visit | Attending: Cardiology | Admitting: Cardiology

## 2017-06-15 ENCOUNTER — Ambulatory Visit: Payer: Medicare HMO | Admitting: Cardiology

## 2017-06-15 VITALS — BP 126/74 | HR 76 | Ht 72.0 in | Wt 199.0 lb

## 2017-06-15 DIAGNOSIS — I10 Essential (primary) hypertension: Secondary | ICD-10-CM | POA: Diagnosis not present

## 2017-06-15 DIAGNOSIS — I2699 Other pulmonary embolism without acute cor pulmonale: Secondary | ICD-10-CM

## 2017-06-15 DIAGNOSIS — R5383 Other fatigue: Secondary | ICD-10-CM

## 2017-06-15 DIAGNOSIS — E785 Hyperlipidemia, unspecified: Secondary | ICD-10-CM

## 2017-06-15 DIAGNOSIS — I25119 Atherosclerotic heart disease of native coronary artery with unspecified angina pectoris: Secondary | ICD-10-CM | POA: Diagnosis not present

## 2017-06-15 DIAGNOSIS — Z951 Presence of aortocoronary bypass graft: Secondary | ICD-10-CM

## 2017-06-15 NOTE — Patient Instructions (Signed)
Ok to take Aspirin Mon-Wed-Fri    Your physician wants you to follow-up in: 1 year. You will receive a reminder letter in the mail two months in advance. If you don't receive a letter, please call our office to schedule the follow-up appointment.

## 2017-06-15 NOTE — Progress Notes (Signed)
PCP: Chesley Noon, MD  Clinic Note: Chief Complaint  Patient presents with  . Follow-up    Pt has no complaints   . Coronary Artery Disease    Status post CABG following multiple different interventions.    HPI: Robert Boyle is a 82 y.o. male with a PMH below who presents today for Six-month follow-up for CAD-CABG. Postop CABG complicated by large pulmonary embolus. Has completed one year for anticoagulation. Has been intolerant of increased dose of beta blockers. Now on low-dose beta blocker. He had a very difficult several mont to recover from CABG and his PE, he had difficulty w mmHg levels as well as some depression issues. He is now doing very well. Continues to attend cardiac rehabilitation maintenance program doing quite well.  Robert Boyle was last seen on 06/03/2013  Recent Hospitalizations: none  Studies Personally Reviewed - (if available, images/films reviewed: From Epic Chart or Care Everywhere)  none  Interval History: [name] returns today doing very well.  As has been the case for the last several visits, he remains in great spirits.  He enjoys going to cardiac rehab 3 days a week, he then also tries to do some extra workout/walking and exercises on the non-rehab days.  He has been joined by several of his compatriots from cardiac rehab on lunch and dinner outings which has really helped him overcome his initial concerns post CABG. He will he says that the walking about 4 miles a day.  He is very happy with how well he is progressed compared to where he started.  He keeps looking at the progression and is extremely proud of himself and part of the group that he is with.  His back still hurts a little bit from where he hurt it last I saw him, but it is notably improving. He tells me that he just had labs checked and everything looked good.  I do not have the results at this time.  Besides expected mild arthritis pains in his back pain, he is doing great with  no complaints.  He states that he is having no chest tightness or pressure with rest or exertion.  No exertional dyspnea.  No rapid irregular heartbeats or palpitations.  No lightheadedness, dizziness, syncope/near syncope or TIA/emesis fugax.  No melena, hematochezia, hematuria or epistaxis.  No claudication.  ROS: A comprehensive was performed. Review of Systems  Musculoskeletal: Positive for back pain (His back injury seems to be healing) and joint pain (routine arthritis pains).  Neurological: Positive for dizziness (only occasionally when he bends down quickly --probably often related to many may be a little dehydrated.  But he is not had nearly the amount issues that he has had before.).  Psychiatric/Behavioral: Negative for depression and memory loss. The patient is not nervous/anxious.   All other systems reviewed and are negative.   I have reviewed and (if needed) personally updated the patient's problem list, medications, allergies, past medical and surgical history, social and family history.   Past Medical History:  Diagnosis Date  . Acute pulmonary embolus ([place]) 04/13/14   CTA: Bilateral Main PAs; suggestion of increased RV strain & R basal pulmonary infarct,  Mild dilation of the ascending aorta 2.8 cm noted. --> On Xarelto  . Arthritis   . Arthritis   . CAD S/P percutaneous coronary angioplasty 2000, 2001, 03/2014   a. 2000: BMS to D1 75% stenosis (~2.5 mm x 18 mm AVE BMS); b. 2001 ISR of D1 stent -  PTCA w/ 2.5 mm Balloon;; c. Myoview 11/'15: Intermediate Risk, inferior-inferolateral ischemia --> Cath: 40% proximal LAD with 90% mid; 90% mid D1 and D2; 100% proximal RCA with left-to-right collaterals for both PL and PDA --> CABG  d. CABG 03/2014: LIMA-LAD, SVG-D1-D2, and SVG sequentially to PDA and PLB.  . Essential hypertension   . Gastrointestinal complaints   . GERD (gastroesophageal reflux disease)   . Gout   . H/O renal calculi   . History of radiation therapy 09/06/13-  11/02/13   prostate 7600 cGy 40 sessions, seminal vesicles 5600 cGy 40 sessions  . Hyperlipidemia with target LDL less than 70   . Prostate cancer ([facility]) 05/23/13   gleason 4+3=7, vloume 34.32 cc; s/p Chemo-XRT  . 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 Shortness of breath dyspnea     Past Surgical History:  Procedure Laterality Date  . BACK SURGERY  1995   lumbar  . Carotid & Upper Extr. Ultrasound  July 2016   Mild Bilateral fibrous plaque in ICAs, normal Vertebral flow, No SCA stenosis  . CHOLECYSTECTOMY  01/22/10  . CORONARY ANGIOPLASTY  07/21/1999   PTCA to ISR stenosis to LAD  . CORONARY ANGIOPLASTY WITH STENT PLACEMENT  10/17/1998   PCI with ~2.5 mm x 18 mm AVE BMS to D1  . CORONARY ARTERY BYPASS GRAFT N/A 03/21/2014   Procedure: CORONARY ARTERY BYPASS GRAFTING (CABG) TIMES FIVE USING LEFT INTERNAL MAMMARY TO LAD, SAPHENOUS VEIN GRAFT TO THE PD/PL, SAPHENOUS VEIN GRAFT TO DIAGONAL 1 AND 2 SEQUENTIALLY, SAPHENOUS VEIN HARVESTED ENDOSCOPICALLY;  Surgeon: Grace Isaac, MD;  Location: [facility];  Service: Open Heart Surgery;  Laterality: N/A;  . LEFT HEART CATHETERIZATION WITH CORONARY ANGIOGRAM N/A 03/20/2014   Procedure: LEFT HEART CATHETERIZATION WITH CORONARY ANGIOGRAM;  Surgeon: Leonie Man, MD;  Location: [facility] CATH LAB;  Service: Cardiovascular;  Laterality: N/A;  . NM MYOCAR PERF WALL MOTION  11/06/2011   low risk 63% ; FIXED INFEROSEPTAL GUT ATTENUATION  . PROSTATE BIOPSY  05/23/13   gleason 4+3=7, vol 34.32 cc  . TEE WITHOUT CARDIOVERSION N/A 03/21/2014   Procedure: TRANSESOPHAGEAL ECHOCARDIOGRAM (TEE);  Surgeon: Grace Isaac, MD;  Location: [facility];  Service: Open Heart Surgery;  Laterality: N/A;  . TRANSTHORACIC ECHOCARDIOGRAM  Mar 04, 2014   EF 55-60%, Gr 1 DD.  Mildly dilated Ascending Aorta; RV now back to normal    Current Meds  Medication Sig  . allopurinol (ZYLOPRIM) 300 MG tablet Take 300 mg by mouth daily.  Marland Kitchen amLODipine (NORVASC) 10 MG  tablet Take 1 tablet (10 mg total) by mouth daily.  . celecoxib (CELEBREX) 200 MG capsule Take 200 mg by mouth daily.  Marland Kitchen losartan (COZAAR) 100 MG tablet Take 1 tablet (100 mg total) by mouth daily.  . metoprolol tartrate (LOPRESSOR) 25 MG tablet Take 0.5 tablets (12.5 mg total) by mouth 2 (two) times daily.  Marland Kitchen omeprazole (PRILOSEC) 40 MG capsule Take 40 mg by mouth daily.  . rosuvastatin (CRESTOR) 20 MG tablet Take 20 mg by mouth daily.  . [DISCONTINUED] aspirin EC 81 MG EC tablet Take 1 tablet (81 mg total) by mouth daily.    No Known Allergies  Social History   Socioeconomic History  . Marital status: Single    Spouse name: None  . Number of children: None  . Years of education: None  . Highest education level: None  Social Needs  . Financial resource strain: None  . Food insecurity -  worry: None  . Food insecurity - inability: None  . Transportation needs - medical: None  . Transportation needs - non-medical: None  Occupational History  . None  Tobacco Use  . Smoking status: Former Smoker    Packs/day: 2.00    Years: 25.00    Pack years: 50.00    Last attempt to quit: 04/20/1983    Years since quitting: 34.1  . Smokeless tobacco: Never Used  Substance and Sexual Activity  . Alcohol use: Yes    Alcohol/week: 0.0 oz    Comment: 3 a day  . Drug use: No  . Sexual activity: None  Other Topics Concern  . None  Social History Narrative   Retired.    Divorced, but lives with his ex-wife.   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 No tobacco products.   Plays golf with his friends and sons.    family history includes Cancer in his brother; Heart attack in his brother and father; Stroke in his mother.  Wt Readings from Last 3 Encounters:  06/15/17 199 lb (90.3 kg)  12/16/16 198 lb (89.8 kg)  08/15/16 200 lb (90.7 kg)    PHYSICAL EXAM BP 126/74 (BP Location: Left Arm, Patient Position: Sitting, Cuff Size: Normal)   Pulse 76   Ht 6' (1.829 m)   Wt 199 lb (90.3 kg)   BMI  26.99 kg/m  Physical Exam  Constitutional: He is oriented to person, place, and time. He appears well-developed and well-nourished. No distress.  Well-groomed.  Healthy-appearing.  HENT:  Head: Normocephalic and atraumatic.  Mouth/Throat: No oropharyngeal exudate.  Eyes: EOM are normal.  Neck: Normal range of motion. Neck supple. No hepatojugular reflux and no JVD present. Carotid bruit is not present.  Cardiovascular: Normal rate, regular rhythm, S1 normal and intact distal pulses. Exam reveals no gallop and no friction rub.  No murmur heard. Split S2  Pulmonary/Chest: Effort normal and breath sounds normal. No respiratory distress. He has no wheezes. He has no rales.  Abdominal: Soft. Bowel sounds are normal. He exhibits no distension. There is no tenderness. There is no rebound and no guarding.  Mild truncal obesity  Neurological: He is alert and oriented to person, place, and time.  Skin: Skin is warm and dry. He is not diaphoretic.  Psychiatric: He has a normal mood and affect. His behavior is normal. Judgment and thought content normal.  he is overall in very good spirits. Smiling.     Adult ECG Report  Rate: 76 ;  Rhythm: normal sinus rhythm and 1 degree AVD.  Nonspecific ST and T wave changes.;  Normal axis, intervals and durations.  Narrative Interpretation: Relatively stable, normal EKG   Other studies Reviewed: Additional studies/ records that were reviewed today include:  Recent Labs:    February 2018: TC 177, TG 109, HDL 90, LDL 65 -- recently checked Jan 2019 (stable)  ASSESSMENT / PLAN: Problem List Items Addressed This Visit    Essential hypertension - Primary (Chronic)    Excellent control today on current meds.  No change      Relevant Medications   aspirin EC 81 MG tablet   Other Relevant Orders   EKG 12-Lead (Completed)   Fatigue due to treatment    Pretty much no longer an issue since we backed off on his beta-blocker.  He is tolerating it okay twice a  day now.      Hyperlipidemia with target LDL less than 70 (Chronic)    He tells me  that his labs were stable this year compared to last year which were much at goal. Continue current dose of Crestor/rosuvastatin      Relevant Medications   aspirin EC 81 MG tablet   MV CAD - Unstable Angina --> CABG x 5; now Stable w/o Angina (Chronic)    He took a little while to recover from his CABG, but now that he is beyond the recovery.  That was delayed because of his pulmonary emboli and heart failure issues, he is doing amazingly well.  He is continuing to do well with cardiac rehab with no active angina symptoms or heart failure symptoms.  No signs of any arrhythmias. He is on a stable get very low dose of beta-blocker without palpitations or fatigue along with amlodipine and losartan tolerating well. He remains on moderate dose rosuvastatin and tolerating well without any mild We are going to reduce his aspirin to Monday Wednesday Friday since he is taking Celebrex most days.      Relevant Medications   aspirin EC 81 MG tablet   Other Relevant Orders   EKG 12-Lead (Completed)   Pulmonary embolism, bilateral, with pulmonary infarction (Chronic)    No further issues.  No longer on anticoagulation      Relevant Medications   aspirin EC 81 MG tablet   S/P CABG x 5: LIMA-LAD, SVG-D1-D2, SVG-RPDA-RPL (Chronic)    He is now 3 years postop, would probably be due for a stress test evaluation next year after follow-up.  We talked about this and he wants to think about it but will probably be up for doing it either before I see him next year or shortly after.         Current medicines are reviewed at length with the patient today. (+/- concerns) he asked about needing to take daily aspirin since he is taking NSAIDs on occasion. The following changes have been made: - okay to take aspirin Monday Wednesday Friday if he takes an NSAID, he should not take aspirin a day  Patient Instructions  Ok to  take Aspirin Mon-Wed-Fri    Your physician wants you to follow-up in: 1 year. You will receive a reminder letter in the mail two months in advance. If you don't receive a letter, please call our office to schedule the follow-up appointment.    Studies Ordered:   Orders Placed This Encounter  Procedures  . EKG 12-Lead      Glenetta Hew, M.D., M.S. Interventional Cardiologist   Pager # [phone] Phone # [phone] [address] [place], [place] [address]

## 2017-06-16 ENCOUNTER — Encounter (HOSPITAL_COMMUNITY)
Admission: RE | Admit: 2017-06-16 | Discharge: 2017-06-16 | Disposition: A | Discharge status: Home / Self Care | Payer: Self-pay | Source: Ambulatory Visit | Attending: Cardiology | Admitting: Cardiology

## 2017-06-17 ENCOUNTER — Encounter: Payer: Self-pay | Admitting: Cardiology

## 2017-06-17 NOTE — Assessment & Plan Note (Signed)
No further issues.  No longer on anticoagulation

## 2017-06-17 NOTE — Assessment & Plan Note (Signed)
Excellent control today on current meds.  No change

## 2017-06-17 NOTE — Assessment & Plan Note (Signed)
He tells me that his labs were stable this year compared to last year which were much at goal. Continue current dose of Crestor/rosuvastatin

## 2017-06-17 NOTE — Assessment & Plan Note (Signed)
He is now 3 years postop, would probably be due for a stress test evaluation next year after follow-up.  We talked about this and he wants to think about it but will probably be up for doing it either before I see him next year or shortly after.

## 2017-06-17 NOTE — Assessment & Plan Note (Signed)
He took a little while to recover from his CABG, but now that he is beyond the recovery.  That was delayed because of his pulmonary emboli and heart failure issues, he is doing amazingly well.  He is continuing to do well with cardiac rehab with no active angina symptoms or heart failure symptoms.  No signs of any arrhythmias. He is on a stable get very low dose of beta-blocker without palpitations or fatigue along with amlodipine and losartan tolerating well. He remains on moderate dose rosuvastatin and tolerating well without any mild We are going to reduce his aspirin to Monday Wednesday Friday since he is taking Celebrex most days.

## 2017-06-17 NOTE — Assessment & Plan Note (Signed)
Pretty much no longer an issue since we backed off on his beta-blocker.  He is tolerating it okay twice a day now.

## 2017-06-18 ENCOUNTER — Encounter (HOSPITAL_COMMUNITY)
Admission: RE | Admit: 2017-06-18 | Discharge: 2017-06-18 | Disposition: A | Discharge status: Home / Self Care | Payer: Self-pay | Source: Ambulatory Visit | Attending: Cardiology | Admitting: Cardiology

## 2017-06-18 DIAGNOSIS — Z951 Presence of aortocoronary bypass graft: Secondary | ICD-10-CM | POA: Insufficient documentation

## 2017-06-18 DIAGNOSIS — Z48812 Encounter for surgical aftercare following surgery on the circulatory system: Secondary | ICD-10-CM | POA: Insufficient documentation

## 2017-06-21 ENCOUNTER — Encounter (HOSPITAL_COMMUNITY): Payer: Self-pay

## 2017-06-23 ENCOUNTER — Encounter (HOSPITAL_COMMUNITY)
Admission: RE | Admit: 2017-06-23 | Discharge: 2017-06-23 | Disposition: A | Discharge status: Home / Self Care | Payer: Self-pay | Source: Ambulatory Visit | Attending: Cardiology | Admitting: Cardiology

## 2017-06-25 ENCOUNTER — Encounter (HOSPITAL_COMMUNITY)
Admission: RE | Admit: 2017-06-25 | Discharge: 2017-06-25 | Disposition: A | Discharge status: Home / Self Care | Payer: Self-pay | Source: Ambulatory Visit | Attending: Cardiology | Admitting: Cardiology

## 2017-06-28 ENCOUNTER — Encounter (HOSPITAL_COMMUNITY): Payer: Self-pay

## 2017-06-30 ENCOUNTER — Encounter (HOSPITAL_COMMUNITY)
Admission: RE | Admit: 2017-06-30 | Discharge: 2017-06-30 | Disposition: A | Discharge status: Home / Self Care | Payer: Self-pay | Source: Ambulatory Visit | Attending: Cardiology | Admitting: Cardiology

## 2017-07-02 ENCOUNTER — Encounter (HOSPITAL_COMMUNITY)
Admission: RE | Admit: 2017-07-02 | Discharge: 2017-07-02 | Disposition: A | Discharge status: Home / Self Care | Payer: Medicare HMO | Source: Ambulatory Visit | Attending: Cardiology | Admitting: Cardiology

## 2017-07-05 ENCOUNTER — Encounter (HOSPITAL_COMMUNITY)
Admission: RE | Admit: 2017-07-05 | Discharge: 2017-07-05 | Disposition: A | Discharge status: Home / Self Care | Payer: Self-pay | Source: Ambulatory Visit | Attending: Cardiology | Admitting: Cardiology

## 2017-07-07 ENCOUNTER — Encounter (HOSPITAL_COMMUNITY)
Admission: RE | Admit: 2017-07-07 | Discharge: 2017-07-07 | Disposition: A | Discharge status: Home / Self Care | Payer: Self-pay | Source: Ambulatory Visit | Attending: Cardiology | Admitting: Cardiology

## 2017-07-09 ENCOUNTER — Encounter (HOSPITAL_COMMUNITY): Payer: Self-pay

## 2017-07-12 ENCOUNTER — Encounter (HOSPITAL_COMMUNITY)
Admission: RE | Admit: 2017-07-12 | Discharge: 2017-07-12 | Disposition: A | Discharge status: Home / Self Care | Payer: Self-pay | Source: Ambulatory Visit | Attending: Cardiology | Admitting: Cardiology

## 2017-07-14 ENCOUNTER — Encounter (HOSPITAL_COMMUNITY)
Admission: RE | Admit: 2017-07-14 | Discharge: 2017-07-14 | Disposition: A | Discharge status: Home / Self Care | Payer: Self-pay | Source: Ambulatory Visit | Attending: Cardiology | Admitting: Cardiology

## 2017-07-16 ENCOUNTER — Encounter (HOSPITAL_COMMUNITY)
Admission: RE | Admit: 2017-07-16 | Discharge: 2017-07-16 | Disposition: A | Discharge status: Home / Self Care | Payer: Self-pay | Source: Ambulatory Visit | Attending: Cardiology | Admitting: Cardiology

## 2017-07-19 ENCOUNTER — Encounter (HOSPITAL_COMMUNITY): Payer: Self-pay

## 2017-07-19 DIAGNOSIS — Z48812 Encounter for surgical aftercare following surgery on the circulatory system: Secondary | ICD-10-CM | POA: Insufficient documentation

## 2017-07-19 DIAGNOSIS — Z951 Presence of aortocoronary bypass graft: Secondary | ICD-10-CM | POA: Insufficient documentation

## 2017-07-20 ENCOUNTER — Other Ambulatory Visit: Payer: Self-pay | Admitting: Cardiology

## 2017-07-20 MED ORDER — AMLODIPINE BESYLATE 10 MG PO TABS: 10.0000 mg | ORAL_TABLET | Freq: Every day | ORAL | 3 refills | Status: DC

## 2017-07-20 NOTE — Telephone Encounter (Signed)
Rx(s) sent to pharmacy electronically.  

## 2017-07-20 NOTE — Telephone Encounter (Signed)
New Message    *STAT* If patient is at the pharmacy, call can be transferred to refill team.   1. Which medications need to be refilled? (please list name of each medication and dose if known) amLODipine (NORVASC) 10 MG tablet(Expired)  2. Which pharmacy/location (including street and city if local pharmacy) is medication to be sent to? [facility], [place]  3. Do they need a 30 day or 90 day supply? [place]

## 2017-07-21 ENCOUNTER — Encounter (HOSPITAL_COMMUNITY)
Admission: RE | Admit: 2017-07-21 | Discharge: 2017-07-21 | Disposition: A | Discharge status: Home / Self Care | Payer: Medicare HMO | Source: Ambulatory Visit | Attending: Cardiology | Admitting: Cardiology

## 2017-07-23 ENCOUNTER — Encounter (HOSPITAL_COMMUNITY)
Admission: RE | Admit: 2017-07-23 | Discharge: 2017-07-23 | Disposition: A | Discharge status: Home / Self Care | Payer: Medicare HMO | Source: Ambulatory Visit | Attending: Cardiology | Admitting: Cardiology

## 2017-07-26 ENCOUNTER — Encounter (HOSPITAL_COMMUNITY): Payer: Self-pay

## 2017-07-28 ENCOUNTER — Encounter (HOSPITAL_COMMUNITY)
Admission: RE | Admit: 2017-07-28 | Discharge: 2017-07-28 | Disposition: A | Discharge status: Home / Self Care | Payer: Self-pay | Source: Ambulatory Visit | Attending: Cardiology | Admitting: Cardiology

## 2017-07-30 ENCOUNTER — Encounter (HOSPITAL_COMMUNITY)
Admission: RE | Admit: 2017-07-30 | Discharge: 2017-07-30 | Disposition: A | Discharge status: Home / Self Care | Payer: Self-pay | Source: Ambulatory Visit | Attending: Cardiology | Admitting: Cardiology

## 2017-08-02 ENCOUNTER — Encounter (HOSPITAL_COMMUNITY): Payer: Self-pay

## 2017-08-03 ENCOUNTER — Other Ambulatory Visit: Payer: Self-pay | Admitting: Cardiology

## 2017-08-04 ENCOUNTER — Encounter (HOSPITAL_COMMUNITY)
Admission: RE | Admit: 2017-08-04 | Discharge: 2017-08-04 | Disposition: A | Discharge status: Home / Self Care | Payer: Self-pay | Source: Ambulatory Visit | Attending: Cardiology | Admitting: Cardiology

## 2017-08-06 ENCOUNTER — Encounter (HOSPITAL_COMMUNITY): Payer: Self-pay

## 2017-08-09 ENCOUNTER — Encounter (HOSPITAL_COMMUNITY)
Admission: RE | Admit: 2017-08-09 | Discharge: 2017-08-09 | Disposition: A | Discharge status: Home / Self Care | Payer: Medicare HMO | Source: Ambulatory Visit | Attending: Cardiology | Admitting: Cardiology

## 2017-08-11 ENCOUNTER — Encounter (HOSPITAL_COMMUNITY)
Admission: RE | Admit: 2017-08-11 | Discharge: 2017-08-11 | Disposition: A | Discharge status: Home / Self Care | Payer: Medicare HMO | Source: Ambulatory Visit | Attending: Cardiology | Admitting: Cardiology

## 2017-08-13 ENCOUNTER — Encounter (HOSPITAL_COMMUNITY): Payer: Self-pay

## 2017-08-16 ENCOUNTER — Encounter (HOSPITAL_COMMUNITY)
Admission: RE | Admit: 2017-08-16 | Discharge: 2017-08-16 | Disposition: A | Discharge status: Home / Self Care | Payer: Self-pay | Source: Ambulatory Visit | Attending: Cardiology | Admitting: Cardiology

## 2017-08-18 ENCOUNTER — Encounter (HOSPITAL_COMMUNITY)
Admission: RE | Admit: 2017-08-18 | Discharge: 2017-08-18 | Disposition: A | Discharge status: Home / Self Care | Payer: Self-pay | Source: Ambulatory Visit | Attending: Cardiology | Admitting: Cardiology

## 2017-08-18 DIAGNOSIS — Z951 Presence of aortocoronary bypass graft: Secondary | ICD-10-CM | POA: Insufficient documentation

## 2017-08-18 DIAGNOSIS — Z48812 Encounter for surgical aftercare following surgery on the circulatory system: Secondary | ICD-10-CM | POA: Insufficient documentation

## 2017-08-20 ENCOUNTER — Encounter (HOSPITAL_COMMUNITY): Payer: Self-pay

## 2017-08-23 ENCOUNTER — Encounter (HOSPITAL_COMMUNITY)
Admission: RE | Admit: 2017-08-23 | Discharge: 2017-08-23 | Disposition: A | Discharge status: Home / Self Care | Payer: Self-pay | Source: Ambulatory Visit | Attending: Cardiology | Admitting: Cardiology

## 2017-08-25 ENCOUNTER — Encounter (HOSPITAL_COMMUNITY): Payer: Self-pay

## 2017-08-27 ENCOUNTER — Encounter (HOSPITAL_COMMUNITY): Payer: Self-pay

## 2017-08-30 ENCOUNTER — Encounter (HOSPITAL_COMMUNITY)
Admission: RE | Admit: 2017-08-30 | Discharge: 2017-08-30 | Disposition: A | Discharge status: Home / Self Care | Payer: Self-pay | Source: Ambulatory Visit | Attending: Cardiology | Admitting: Cardiology

## 2017-09-01 ENCOUNTER — Encounter (HOSPITAL_COMMUNITY): Payer: Self-pay

## 2017-09-03 ENCOUNTER — Encounter (HOSPITAL_COMMUNITY)
Admission: RE | Admit: 2017-09-03 | Discharge: 2017-09-03 | Disposition: A | Discharge status: Home / Self Care | Payer: Medicare HMO | Source: Ambulatory Visit | Attending: Cardiology | Admitting: Cardiology

## 2017-09-06 ENCOUNTER — Encounter (HOSPITAL_COMMUNITY)
Admission: RE | Admit: 2017-09-06 | Discharge: 2017-09-06 | Disposition: A | Discharge status: Home / Self Care | Payer: Self-pay | Source: Ambulatory Visit | Attending: Cardiology | Admitting: Cardiology

## 2017-09-08 ENCOUNTER — Encounter (HOSPITAL_COMMUNITY): Payer: Self-pay

## 2017-09-10 ENCOUNTER — Encounter (HOSPITAL_COMMUNITY)
Admission: RE | Admit: 2017-09-10 | Discharge: 2017-09-10 | Disposition: A | Discharge status: Home / Self Care | Payer: Self-pay | Source: Ambulatory Visit | Attending: Cardiology | Admitting: Cardiology

## 2017-09-15 ENCOUNTER — Encounter (HOSPITAL_COMMUNITY): Payer: Self-pay

## 2017-09-17 ENCOUNTER — Encounter (HOSPITAL_COMMUNITY): Payer: Self-pay

## 2017-09-20 ENCOUNTER — Encounter (HOSPITAL_COMMUNITY)
Admission: RE | Admit: 2017-09-20 | Discharge: 2017-09-20 | Disposition: A | Discharge status: Home / Self Care | Payer: Self-pay | Source: Ambulatory Visit | Attending: Cardiology | Admitting: Cardiology

## 2017-09-20 DIAGNOSIS — Z951 Presence of aortocoronary bypass graft: Secondary | ICD-10-CM | POA: Insufficient documentation

## 2017-09-20 DIAGNOSIS — Z48812 Encounter for surgical aftercare following surgery on the circulatory system: Secondary | ICD-10-CM | POA: Insufficient documentation

## 2017-09-22 ENCOUNTER — Encounter (HOSPITAL_COMMUNITY): Payer: Self-pay

## 2017-09-24 ENCOUNTER — Encounter (HOSPITAL_COMMUNITY)
Admission: RE | Admit: 2017-09-24 | Discharge: 2017-09-24 | Disposition: A | Discharge status: Home / Self Care | Payer: Self-pay | Source: Ambulatory Visit | Attending: Cardiology | Admitting: Cardiology

## 2017-09-27 ENCOUNTER — Encounter (HOSPITAL_COMMUNITY): Payer: Self-pay

## 2017-09-29 ENCOUNTER — Encounter (HOSPITAL_COMMUNITY)
Admission: RE | Admit: 2017-09-29 | Discharge: 2017-09-29 | Disposition: A | Discharge status: Home / Self Care | Payer: Self-pay | Source: Ambulatory Visit | Attending: Cardiology | Admitting: Cardiology

## 2017-10-01 ENCOUNTER — Encounter (HOSPITAL_COMMUNITY): Payer: Self-pay

## 2017-10-04 ENCOUNTER — Encounter (HOSPITAL_COMMUNITY)
Admission: RE | Admit: 2017-10-04 | Discharge: 2017-10-04 | Disposition: A | Discharge status: Home / Self Care | Payer: Self-pay | Source: Ambulatory Visit | Attending: Cardiology | Admitting: Cardiology

## 2017-10-06 ENCOUNTER — Encounter (HOSPITAL_COMMUNITY)
Admission: RE | Admit: 2017-10-06 | Discharge: 2017-10-06 | Disposition: A | Discharge status: Home / Self Care | Payer: Medicare HMO | Source: Ambulatory Visit | Attending: Cardiology | Admitting: Cardiology

## 2017-10-08 ENCOUNTER — Encounter (HOSPITAL_COMMUNITY): Payer: Self-pay

## 2017-10-11 ENCOUNTER — Encounter (HOSPITAL_COMMUNITY)
Admission: RE | Admit: 2017-10-11 | Discharge: 2017-10-11 | Disposition: A | Discharge status: Home / Self Care | Payer: Self-pay | Source: Ambulatory Visit | Attending: Cardiology | Admitting: Cardiology

## 2017-10-13 ENCOUNTER — Encounter (HOSPITAL_COMMUNITY): Payer: Self-pay

## 2017-10-15 ENCOUNTER — Encounter (HOSPITAL_COMMUNITY)
Admission: RE | Admit: 2017-10-15 | Discharge: 2017-10-15 | Disposition: A | Discharge status: Home / Self Care | Payer: Medicare HMO | Source: Ambulatory Visit | Attending: Cardiology | Admitting: Cardiology

## 2017-10-18 ENCOUNTER — Encounter (HOSPITAL_COMMUNITY)
Admission: RE | Admit: 2017-10-18 | Discharge: 2017-10-18 | Disposition: A | Discharge status: Home / Self Care | Payer: Self-pay | Source: Ambulatory Visit | Attending: Cardiology | Admitting: Cardiology

## 2017-10-18 DIAGNOSIS — Z951 Presence of aortocoronary bypass graft: Secondary | ICD-10-CM | POA: Insufficient documentation

## 2017-10-20 ENCOUNTER — Encounter (HOSPITAL_COMMUNITY)
Admission: RE | Admit: 2017-10-20 | Discharge: 2017-10-20 | Disposition: A | Discharge status: Home / Self Care | Payer: Self-pay | Source: Ambulatory Visit | Attending: Cardiology | Admitting: Cardiology

## 2017-10-22 ENCOUNTER — Encounter (HOSPITAL_COMMUNITY): Payer: Self-pay

## 2017-10-25 ENCOUNTER — Encounter (HOSPITAL_COMMUNITY)
Admission: RE | Admit: 2017-10-25 | Discharge: 2017-10-25 | Disposition: A | Discharge status: Home / Self Care | Payer: Self-pay | Source: Ambulatory Visit | Attending: Cardiology | Admitting: Cardiology

## 2017-10-27 ENCOUNTER — Encounter (HOSPITAL_COMMUNITY)
Admission: RE | Admit: 2017-10-27 | Discharge: 2017-10-27 | Disposition: A | Discharge status: Home / Self Care | Payer: Self-pay | Source: Ambulatory Visit | Attending: Cardiology | Admitting: Cardiology

## 2017-10-29 ENCOUNTER — Encounter (HOSPITAL_COMMUNITY)
Admission: RE | Admit: 2017-10-29 | Discharge: 2017-10-29 | Disposition: A | Discharge status: Home / Self Care | Payer: Self-pay | Source: Ambulatory Visit | Attending: Cardiology | Admitting: Cardiology

## 2017-11-01 ENCOUNTER — Encounter (HOSPITAL_COMMUNITY)
Admission: RE | Admit: 2017-11-01 | Discharge: 2017-11-01 | Disposition: A | Discharge status: Home / Self Care | Payer: Self-pay | Source: Ambulatory Visit | Attending: Cardiology | Admitting: Cardiology

## 2017-11-03 ENCOUNTER — Encounter (HOSPITAL_COMMUNITY)
Admission: RE | Admit: 2017-11-03 | Discharge: 2017-11-03 | Disposition: A | Discharge status: Home / Self Care | Payer: Self-pay | Source: Ambulatory Visit | Attending: Cardiology | Admitting: Cardiology

## 2017-11-05 ENCOUNTER — Encounter (HOSPITAL_COMMUNITY): Payer: Self-pay

## 2017-11-08 ENCOUNTER — Encounter (HOSPITAL_COMMUNITY): Payer: Self-pay

## 2017-11-10 ENCOUNTER — Encounter (HOSPITAL_COMMUNITY)
Admission: RE | Admit: 2017-11-10 | Discharge: 2017-11-10 | Disposition: A | Discharge status: Home / Self Care | Payer: Self-pay | Source: Ambulatory Visit | Attending: Cardiology | Admitting: Cardiology

## 2017-11-12 ENCOUNTER — Encounter (HOSPITAL_COMMUNITY): Payer: Self-pay

## 2017-11-15 ENCOUNTER — Encounter (HOSPITAL_COMMUNITY)
Admission: RE | Admit: 2017-11-15 | Discharge: 2017-11-15 | Disposition: A | Discharge status: Home / Self Care | Payer: Self-pay | Source: Ambulatory Visit | Attending: Cardiology | Admitting: Cardiology

## 2017-11-17 ENCOUNTER — Encounter (HOSPITAL_COMMUNITY): Payer: Self-pay

## 2017-11-19 ENCOUNTER — Encounter (HOSPITAL_COMMUNITY)
Admission: RE | Admit: 2017-11-19 | Discharge: 2017-11-19 | Disposition: A | Discharge status: Home / Self Care | Payer: Self-pay | Source: Ambulatory Visit | Attending: Cardiology | Admitting: Cardiology

## 2017-11-19 DIAGNOSIS — Z951 Presence of aortocoronary bypass graft: Secondary | ICD-10-CM | POA: Insufficient documentation

## 2017-11-22 ENCOUNTER — Encounter (HOSPITAL_COMMUNITY): Payer: Self-pay

## 2017-11-24 ENCOUNTER — Encounter (HOSPITAL_COMMUNITY)
Admission: RE | Admit: 2017-11-24 | Discharge: 2017-11-24 | Disposition: A | Discharge status: Home / Self Care | Payer: Self-pay | Source: Ambulatory Visit | Attending: Cardiology | Admitting: Cardiology

## 2017-11-26 ENCOUNTER — Encounter (HOSPITAL_COMMUNITY): Payer: Self-pay

## 2017-11-29 ENCOUNTER — Encounter (HOSPITAL_COMMUNITY)
Admission: RE | Admit: 2017-11-29 | Discharge: 2017-11-29 | Disposition: A | Discharge status: Home / Self Care | Payer: Self-pay | Source: Ambulatory Visit | Attending: Cardiology | Admitting: Cardiology

## 2017-11-30 ENCOUNTER — Other Ambulatory Visit: Payer: Self-pay

## 2017-11-30 MED ORDER — LOSARTAN POTASSIUM 100 MG PO TABS: 100.0000 mg | ORAL_TABLET | Freq: Every day | ORAL | 1 refills | Status: DC

## 2017-12-01 ENCOUNTER — Encounter (HOSPITAL_COMMUNITY)
Admission: RE | Admit: 2017-12-01 | Discharge: 2017-12-01 | Disposition: A | Discharge status: Home / Self Care | Payer: Self-pay | Source: Ambulatory Visit | Attending: Cardiology | Admitting: Cardiology

## 2017-12-03 ENCOUNTER — Encounter (HOSPITAL_COMMUNITY): Payer: Self-pay

## 2017-12-06 ENCOUNTER — Encounter (HOSPITAL_COMMUNITY)
Admission: RE | Admit: 2017-12-06 | Discharge: 2017-12-06 | Disposition: A | Discharge status: Home / Self Care | Payer: Self-pay | Source: Ambulatory Visit | Attending: Cardiology | Admitting: Cardiology

## 2017-12-08 ENCOUNTER — Encounter (HOSPITAL_COMMUNITY)
Admission: RE | Admit: 2017-12-08 | Discharge: 2017-12-08 | Disposition: A | Discharge status: Home / Self Care | Payer: Self-pay | Source: Ambulatory Visit | Attending: Cardiology | Admitting: Cardiology

## 2017-12-10 ENCOUNTER — Encounter (HOSPITAL_COMMUNITY): Payer: Self-pay

## 2017-12-13 ENCOUNTER — Encounter (HOSPITAL_COMMUNITY)
Admission: RE | Admit: 2017-12-13 | Discharge: 2017-12-13 | Disposition: A | Discharge status: Home / Self Care | Payer: Medicare HMO | Source: Ambulatory Visit | Attending: Cardiology | Admitting: Cardiology

## 2017-12-15 ENCOUNTER — Encounter (HOSPITAL_COMMUNITY): Payer: Self-pay

## 2017-12-17 ENCOUNTER — Encounter (HOSPITAL_COMMUNITY)
Admission: RE | Admit: 2017-12-17 | Discharge: 2017-12-17 | Disposition: A | Discharge status: Home / Self Care | Payer: Medicare HMO | Source: Ambulatory Visit | Attending: Cardiology | Admitting: Cardiology

## 2017-12-22 ENCOUNTER — Encounter (HOSPITAL_COMMUNITY): Payer: Self-pay

## 2017-12-22 DIAGNOSIS — Z951 Presence of aortocoronary bypass graft: Secondary | ICD-10-CM | POA: Insufficient documentation

## 2017-12-24 ENCOUNTER — Encounter (HOSPITAL_COMMUNITY): Payer: Self-pay

## 2017-12-27 ENCOUNTER — Encounter (HOSPITAL_COMMUNITY)
Admission: RE | Admit: 2017-12-27 | Discharge: 2017-12-27 | Disposition: A | Discharge status: Home / Self Care | Payer: Medicare HMO | Source: Ambulatory Visit | Attending: Cardiology | Admitting: Cardiology

## 2017-12-29 ENCOUNTER — Encounter (HOSPITAL_COMMUNITY): Payer: Self-pay

## 2017-12-31 ENCOUNTER — Encounter (HOSPITAL_COMMUNITY)
Admission: RE | Admit: 2017-12-31 | Discharge: 2017-12-31 | Disposition: A | Discharge status: Home / Self Care | Payer: Self-pay | Source: Ambulatory Visit | Attending: Cardiology | Admitting: Cardiology

## 2018-01-03 ENCOUNTER — Encounter (HOSPITAL_COMMUNITY): Payer: Self-pay

## 2018-01-05 ENCOUNTER — Encounter (HOSPITAL_COMMUNITY)
Admission: RE | Admit: 2018-01-05 | Discharge: 2018-01-05 | Disposition: A | Discharge status: Home / Self Care | Payer: Self-pay | Source: Ambulatory Visit | Attending: Cardiology | Admitting: Cardiology

## 2018-01-07 ENCOUNTER — Encounter (HOSPITAL_COMMUNITY): Payer: Self-pay

## 2018-01-10 ENCOUNTER — Encounter (HOSPITAL_COMMUNITY)
Admission: RE | Admit: 2018-01-10 | Discharge: 2018-01-10 | Disposition: A | Discharge status: Home / Self Care | Payer: Medicare HMO | Source: Ambulatory Visit | Attending: Cardiology | Admitting: Cardiology

## 2018-01-12 ENCOUNTER — Encounter (HOSPITAL_COMMUNITY): Payer: Self-pay

## 2018-01-14 ENCOUNTER — Encounter (HOSPITAL_COMMUNITY)
Admission: RE | Admit: 2018-01-14 | Discharge: 2018-01-14 | Disposition: A | Discharge status: Home / Self Care | Payer: Self-pay | Source: Ambulatory Visit | Attending: Cardiology | Admitting: Cardiology

## 2018-01-17 ENCOUNTER — Encounter (HOSPITAL_COMMUNITY): Payer: Self-pay

## 2018-01-19 ENCOUNTER — Encounter (HOSPITAL_COMMUNITY)
Admission: RE | Admit: 2018-01-19 | Discharge: 2018-01-19 | Disposition: A | Discharge status: Home / Self Care | Payer: Self-pay | Source: Ambulatory Visit | Attending: Cardiology | Admitting: Cardiology

## 2018-01-19 DIAGNOSIS — Z951 Presence of aortocoronary bypass graft: Secondary | ICD-10-CM | POA: Insufficient documentation

## 2018-01-21 ENCOUNTER — Encounter (HOSPITAL_COMMUNITY)
Admission: RE | Admit: 2018-01-21 | Discharge: 2018-01-21 | Disposition: A | Discharge status: Home / Self Care | Payer: Self-pay | Source: Ambulatory Visit | Attending: Cardiology | Admitting: Cardiology

## 2018-01-24 ENCOUNTER — Encounter (HOSPITAL_COMMUNITY): Payer: Self-pay

## 2018-01-26 ENCOUNTER — Encounter (HOSPITAL_COMMUNITY): Payer: Self-pay

## 2018-01-28 ENCOUNTER — Encounter (HOSPITAL_COMMUNITY): Payer: Self-pay

## 2018-01-31 ENCOUNTER — Encounter (HOSPITAL_COMMUNITY)
Admission: RE | Admit: 2018-01-31 | Discharge: 2018-01-31 | Disposition: A | Discharge status: Home / Self Care | Payer: Medicare HMO | Source: Ambulatory Visit | Attending: Cardiology | Admitting: Cardiology

## 2018-02-02 ENCOUNTER — Encounter (HOSPITAL_COMMUNITY): Payer: Self-pay

## 2018-02-04 ENCOUNTER — Encounter (HOSPITAL_COMMUNITY)
Admission: RE | Admit: 2018-02-04 | Discharge: 2018-02-04 | Disposition: A | Discharge status: Home / Self Care | Payer: Medicare HMO | Source: Ambulatory Visit | Attending: Cardiology | Admitting: Cardiology

## 2018-02-07 ENCOUNTER — Encounter (HOSPITAL_COMMUNITY)
Admission: RE | Admit: 2018-02-07 | Discharge: 2018-02-07 | Disposition: A | Discharge status: Home / Self Care | Payer: Self-pay | Source: Ambulatory Visit | Attending: Cardiology | Admitting: Cardiology

## 2018-02-09 ENCOUNTER — Encounter (HOSPITAL_COMMUNITY): Payer: Self-pay

## 2018-02-11 ENCOUNTER — Encounter (HOSPITAL_COMMUNITY): Payer: Self-pay

## 2018-02-14 ENCOUNTER — Encounter (HOSPITAL_COMMUNITY)
Admission: RE | Admit: 2018-02-14 | Discharge: 2018-02-14 | Disposition: A | Discharge status: Home / Self Care | Payer: Self-pay | Source: Ambulatory Visit | Attending: Cardiology | Admitting: Cardiology

## 2018-02-16 ENCOUNTER — Encounter (HOSPITAL_COMMUNITY): Payer: Self-pay

## 2018-02-18 ENCOUNTER — Encounter (HOSPITAL_COMMUNITY)
Admission: RE | Admit: 2018-02-18 | Discharge: 2018-02-18 | Disposition: A | Discharge status: Home / Self Care | Payer: Self-pay | Source: Ambulatory Visit | Attending: Cardiology | Admitting: Cardiology

## 2018-02-18 DIAGNOSIS — Z951 Presence of aortocoronary bypass graft: Secondary | ICD-10-CM | POA: Insufficient documentation

## 2018-02-21 ENCOUNTER — Encounter (HOSPITAL_COMMUNITY)
Admission: RE | Admit: 2018-02-21 | Discharge: 2018-02-21 | Disposition: A | Discharge status: Home / Self Care | Payer: Self-pay | Source: Ambulatory Visit | Attending: Cardiology | Admitting: Cardiology

## 2018-02-23 ENCOUNTER — Encounter (HOSPITAL_COMMUNITY)
Admission: RE | Admit: 2018-02-23 | Discharge: 2018-02-23 | Disposition: A | Discharge status: Home / Self Care | Payer: Self-pay | Source: Ambulatory Visit | Attending: Cardiology | Admitting: Cardiology

## 2018-02-25 ENCOUNTER — Encounter (HOSPITAL_COMMUNITY): Payer: Self-pay

## 2018-02-28 ENCOUNTER — Encounter (HOSPITAL_COMMUNITY)
Admission: RE | Admit: 2018-02-28 | Discharge: 2018-02-28 | Disposition: A | Discharge status: Home / Self Care | Payer: Self-pay | Source: Ambulatory Visit | Attending: Cardiology | Admitting: Cardiology

## 2018-03-02 ENCOUNTER — Encounter (HOSPITAL_COMMUNITY)
Admission: RE | Admit: 2018-03-02 | Discharge: 2018-03-02 | Disposition: A | Discharge status: Home / Self Care | Payer: Self-pay | Source: Ambulatory Visit | Attending: Cardiology | Admitting: Cardiology

## 2018-03-04 ENCOUNTER — Encounter (HOSPITAL_COMMUNITY): Payer: Self-pay

## 2018-03-07 ENCOUNTER — Encounter (HOSPITAL_COMMUNITY)
Admission: RE | Admit: 2018-03-07 | Discharge: 2018-03-07 | Disposition: A | Discharge status: Home / Self Care | Payer: Medicare HMO | Source: Ambulatory Visit | Attending: Cardiology | Admitting: Cardiology

## 2018-03-09 ENCOUNTER — Encounter (HOSPITAL_COMMUNITY): Payer: Self-pay

## 2018-03-11 ENCOUNTER — Encounter (HOSPITAL_COMMUNITY)
Admission: RE | Admit: 2018-03-11 | Discharge: 2018-03-11 | Disposition: A | Discharge status: Home / Self Care | Payer: Self-pay | Source: Ambulatory Visit | Attending: Cardiology | Admitting: Cardiology

## 2018-03-14 ENCOUNTER — Encounter (HOSPITAL_COMMUNITY): Payer: Self-pay

## 2018-03-16 ENCOUNTER — Encounter (HOSPITAL_COMMUNITY): Payer: Self-pay

## 2018-03-21 ENCOUNTER — Encounter (HOSPITAL_COMMUNITY)
Admission: RE | Admit: 2018-03-21 | Discharge: 2018-03-21 | Disposition: A | Discharge status: Home / Self Care | Payer: Self-pay | Source: Ambulatory Visit | Attending: Cardiology | Admitting: Cardiology

## 2018-03-21 DIAGNOSIS — Z951 Presence of aortocoronary bypass graft: Secondary | ICD-10-CM | POA: Insufficient documentation

## 2018-03-30 ENCOUNTER — Encounter (HOSPITAL_COMMUNITY)
Admission: RE | Admit: 2018-03-30 | Discharge: 2018-03-30 | Disposition: A | Discharge status: Home / Self Care | Payer: Self-pay | Source: Ambulatory Visit | Attending: Cardiology | Admitting: Cardiology

## 2018-04-01 ENCOUNTER — Encounter (HOSPITAL_COMMUNITY): Payer: Self-pay

## 2018-04-04 ENCOUNTER — Encounter (HOSPITAL_COMMUNITY): Payer: Self-pay

## 2018-04-06 ENCOUNTER — Encounter (HOSPITAL_COMMUNITY)
Admission: RE | Admit: 2018-04-06 | Discharge: 2018-04-06 | Disposition: A | Discharge status: Home / Self Care | Payer: Self-pay | Source: Ambulatory Visit | Attending: Cardiology | Admitting: Cardiology

## 2018-04-08 ENCOUNTER — Encounter (HOSPITAL_COMMUNITY): Payer: Self-pay

## 2018-04-11 ENCOUNTER — Encounter (HOSPITAL_COMMUNITY): Payer: Self-pay

## 2018-04-15 ENCOUNTER — Encounter (HOSPITAL_COMMUNITY): Payer: Self-pay

## 2018-04-18 ENCOUNTER — Encounter (HOSPITAL_COMMUNITY)
Admission: RE | Admit: 2018-04-18 | Discharge: 2018-04-18 | Disposition: A | Discharge status: Home / Self Care | Payer: Self-pay | Source: Ambulatory Visit | Attending: Cardiology | Admitting: Cardiology

## 2018-04-22 ENCOUNTER — Encounter (HOSPITAL_COMMUNITY): Payer: Self-pay

## 2018-04-22 DIAGNOSIS — Z951 Presence of aortocoronary bypass graft: Secondary | ICD-10-CM | POA: Insufficient documentation

## 2018-04-25 ENCOUNTER — Encounter (HOSPITAL_COMMUNITY)
Admission: RE | Admit: 2018-04-25 | Discharge: 2018-04-25 | Disposition: A | Discharge status: Home / Self Care | Payer: Self-pay | Source: Ambulatory Visit | Attending: Cardiology | Admitting: Cardiology

## 2018-04-27 ENCOUNTER — Encounter (HOSPITAL_COMMUNITY): Payer: Self-pay

## 2018-04-28 DIAGNOSIS — K219 Gastro-esophageal reflux disease without esophagitis: Secondary | ICD-10-CM | POA: Insufficient documentation

## 2018-04-29 ENCOUNTER — Encounter (HOSPITAL_COMMUNITY): Payer: Self-pay

## 2018-05-02 ENCOUNTER — Encounter (HOSPITAL_COMMUNITY)
Admission: RE | Admit: 2018-05-02 | Discharge: 2018-05-02 | Disposition: A | Discharge status: Home / Self Care | Payer: Medicare HMO | Source: Ambulatory Visit | Attending: Cardiology | Admitting: Cardiology

## 2018-05-04 ENCOUNTER — Encounter (HOSPITAL_COMMUNITY)
Admission: RE | Admit: 2018-05-04 | Discharge: 2018-05-04 | Disposition: A | Discharge status: Home / Self Care | Payer: Self-pay | Source: Ambulatory Visit | Attending: Cardiology | Admitting: Cardiology

## 2018-05-06 ENCOUNTER — Encounter (HOSPITAL_COMMUNITY): Payer: Self-pay

## 2018-05-09 ENCOUNTER — Encounter (HOSPITAL_COMMUNITY): Payer: Self-pay

## 2018-05-11 ENCOUNTER — Encounter (HOSPITAL_COMMUNITY)
Admission: RE | Admit: 2018-05-11 | Discharge: 2018-05-11 | Disposition: A | Discharge status: Home / Self Care | Payer: Self-pay | Source: Ambulatory Visit | Attending: Cardiology | Admitting: Cardiology

## 2018-05-13 ENCOUNTER — Encounter (HOSPITAL_COMMUNITY)
Admission: RE | Admit: 2018-05-13 | Discharge: 2018-05-13 | Disposition: A | Discharge status: Home / Self Care | Payer: Self-pay | Source: Ambulatory Visit | Attending: Cardiology | Admitting: Cardiology

## 2018-05-16 ENCOUNTER — Encounter (HOSPITAL_COMMUNITY)
Admission: RE | Admit: 2018-05-16 | Discharge: 2018-05-16 | Disposition: A | Discharge status: Home / Self Care | Payer: Self-pay | Source: Ambulatory Visit | Attending: Cardiology | Admitting: Cardiology

## 2018-05-18 ENCOUNTER — Encounter (HOSPITAL_COMMUNITY)
Admission: RE | Admit: 2018-05-18 | Discharge: 2018-05-18 | Disposition: A | Discharge status: Home / Self Care | Payer: Self-pay | Source: Ambulatory Visit | Attending: Cardiology | Admitting: Cardiology

## 2018-05-20 ENCOUNTER — Encounter (HOSPITAL_COMMUNITY)
Admission: RE | Admit: 2018-05-20 | Discharge: 2018-05-20 | Disposition: A | Discharge status: Home / Self Care | Payer: Self-pay | Source: Ambulatory Visit | Attending: Cardiology | Admitting: Cardiology

## 2018-05-23 ENCOUNTER — Encounter (HOSPITAL_COMMUNITY): Payer: Self-pay

## 2018-05-23 DIAGNOSIS — Z951 Presence of aortocoronary bypass graft: Secondary | ICD-10-CM | POA: Insufficient documentation

## 2018-05-25 ENCOUNTER — Encounter (HOSPITAL_COMMUNITY)
Admission: RE | Admit: 2018-05-25 | Discharge: 2018-05-25 | Disposition: A | Discharge status: Home / Self Care | Payer: Self-pay | Source: Ambulatory Visit | Attending: Cardiology | Admitting: Cardiology

## 2018-05-25 ENCOUNTER — Other Ambulatory Visit: Payer: Self-pay | Admitting: Cardiology

## 2018-05-27 ENCOUNTER — Encounter (HOSPITAL_COMMUNITY)
Admission: RE | Admit: 2018-05-27 | Discharge: 2018-05-27 | Disposition: A | Discharge status: Home / Self Care | Payer: Self-pay | Source: Ambulatory Visit | Attending: Cardiology | Admitting: Cardiology

## 2018-05-30 ENCOUNTER — Encounter (HOSPITAL_COMMUNITY)
Admission: RE | Admit: 2018-05-30 | Discharge: 2018-05-30 | Disposition: A | Discharge status: Home / Self Care | Payer: Medicare HMO | Source: Ambulatory Visit | Attending: Cardiology | Admitting: Cardiology

## 2018-06-01 ENCOUNTER — Encounter (HOSPITAL_COMMUNITY)
Admission: RE | Admit: 2018-06-01 | Discharge: 2018-06-01 | Disposition: A | Discharge status: Home / Self Care | Payer: Self-pay | Source: Ambulatory Visit | Attending: Cardiology | Admitting: Cardiology

## 2018-06-03 ENCOUNTER — Encounter (HOSPITAL_COMMUNITY): Payer: Self-pay

## 2018-06-06 ENCOUNTER — Encounter (HOSPITAL_COMMUNITY): Payer: Self-pay

## 2018-06-08 ENCOUNTER — Encounter (HOSPITAL_COMMUNITY)
Admission: RE | Admit: 2018-06-08 | Discharge: 2018-06-08 | Disposition: A | Discharge status: Home / Self Care | Payer: Self-pay | Source: Ambulatory Visit | Attending: Cardiology | Admitting: Cardiology

## 2018-06-10 ENCOUNTER — Encounter (HOSPITAL_COMMUNITY): Payer: Self-pay

## 2018-06-13 ENCOUNTER — Encounter (HOSPITAL_COMMUNITY)
Admission: RE | Admit: 2018-06-13 | Discharge: 2018-06-13 | Disposition: A | Discharge status: Home / Self Care | Payer: Self-pay | Source: Ambulatory Visit | Attending: Cardiology | Admitting: Cardiology

## 2018-06-15 ENCOUNTER — Encounter (HOSPITAL_COMMUNITY)
Admission: RE | Admit: 2018-06-15 | Discharge: 2018-06-15 | Disposition: A | Discharge status: Home / Self Care | Payer: Self-pay | Source: Ambulatory Visit | Attending: Cardiology | Admitting: Cardiology

## 2018-06-16 ENCOUNTER — Ambulatory Visit: Payer: Medicare HMO | Admitting: Cardiology

## 2018-06-16 ENCOUNTER — Encounter: Payer: Self-pay | Admitting: Cardiology

## 2018-06-16 VITALS — BP 126/66 | HR 72 | Ht 73.0 in | Wt 192.0 lb

## 2018-06-16 DIAGNOSIS — R5383 Other fatigue: Secondary | ICD-10-CM | POA: Diagnosis not present

## 2018-06-16 DIAGNOSIS — I2510 Atherosclerotic heart disease of native coronary artery without angina pectoris: Secondary | ICD-10-CM

## 2018-06-16 DIAGNOSIS — Z951 Presence of aortocoronary bypass graft: Secondary | ICD-10-CM

## 2018-06-16 DIAGNOSIS — I10 Essential (primary) hypertension: Secondary | ICD-10-CM

## 2018-06-16 DIAGNOSIS — E785 Hyperlipidemia, unspecified: Secondary | ICD-10-CM | POA: Diagnosis not present

## 2018-06-16 NOTE — Progress Notes (Signed)
PCP: Chesley Noon, MD  Clinic Note: Chief Complaint  Patient presents with  . Follow-up    Annual  . Coronary Artery Disease    HPI: Robert Boyle is a 83 y.o. male with a PMH of CAD-CABG in 03/2014 below who presents today for annual follow-up.Marland Kitchen  Postop CABG complicated by large PE that prolonged his recovery  He has completed one year for anticoagulation.  Was initially intolerant of increased dose of beta blockers. Now on low-dose beta blocker. Continues to attend cardiac rehabilitation maintenance program doing quite well.  Robert Boyle was last seen in February 2019.  He was doing very well at that time.  Still going to cardiac rehab without any major issues.  Walking about 4 miles a day.  Still dealing with low back pain but otherwise doing well.  Recent Hospitalizations: none  Studies Personally Reviewed - (if available, images/films reviewed: From Epic Chart or Care Everywhere)  none  Interval History: [name] returns today doing looking and feeling great.  He has lost about 7 pounds since I last saw him.  He is back to a good healthy diet.  He says he is walking about 12 miles a week at cardiac rehab and continues to enjoy it.  He says his PCP did recently check his lipids in January.  LDL was 61, but triglycerides were just a little bit elevated at 158.  [name] tells me that he is feeling great.  Has not had any chest pain or pressure with rest or exertion.  No heart failure symptoms of PND, orthopnea or edema.  No rapid irregular heartbeats palpitations.  No syncope/near syncope or TIA shows amaurosis fugax.    He remains very proud of his progression, and is extremely happy about his weight loss.Marland Kitchen  He talks about how he enjoys the cardiac rehab, getting together with the other patients and their families for eating out together.  He denies any claudication symptoms.  No melena or hematochezia or hematuria from being on aspirin.  No fatigue on current dose of  beta-blocker.  Tolerating current dose of statin well.  ROS: A comprehensive was performed. Review of Systems  Constitutional: Positive for weight loss (Intentional). Negative for malaise/fatigue.  HENT: Negative for congestion and nosebleeds.   Respiratory: Negative for cough, shortness of breath and wheezing.   Gastrointestinal: Negative for abdominal pain and heartburn.  Musculoskeletal: Positive for back pain (But this continues to heal well.) and joint pain (routine arthritis pains).  Neurological: Positive for dizziness (Rarely, if he does not drink enough.).  Psychiatric/Behavioral: Negative for depression and memory loss. The patient is not nervous/anxious.   All other systems reviewed and are negative.   I have reviewed and (if needed) personally updated the patient's problem list, medications, allergies, past medical and surgical history, social and family history.   Past Medical History:  Diagnosis Date  . Acute pulmonary embolus ([place]) 04/13/14   CTA: Bilateral Main PAs; suggestion of increased RV strain & R basal pulmonary infarct,  Mild dilation of the ascending aorta 2.8 cm noted. --> On Xarelto  . Arthritis   . Arthritis   . CAD S/P percutaneous coronary angioplasty 2000, 2001, 03/2014   a. 2000: BMS to D1 75% stenosis (~2.5 mm x 18 mm AVE BMS); b. 2001 ISR of D1 stent - PTCA w/ 2.5 mm Balloon;; c. Myoview 11/'15: Intermediate Risk, inferior-inferolateral ischemia --> Cath: 40% proximal LAD with 90% mid; 90% mid D1 and D2; 100% proximal RCA with  left-to-right collaterals for both PL and PDA --> CABG  d. CABG 03/2014: LIMA-LAD, SVG-D1-D2, and SVG sequentially to PDA and PLB.  . Essential hypertension   . Gastrointestinal complaints   . GERD (gastroesophageal reflux disease)   . Gout   . H/O renal calculi   . History of radiation therapy 09/06/13- 11/02/13   prostate 7600 cGy 40 sessions, seminal vesicles 5600 cGy 40 sessions  . Hyperlipidemia with target LDL less than 70    . Prostate cancer ([place]) 05/23/13   gleason 4+3=7, vloume 34.32 cc; s/p Chemo-XRT  . 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 Shortness of breath dyspnea     Past Surgical History:  Procedure Laterality Date  . BACK SURGERY  1995   lumbar  . Carotid & Upper Extr. Ultrasound  July 2016   Mild Bilateral fibrous plaque in ICAs, normal Vertebral flow, No SCA stenosis  . CHOLECYSTECTOMY  01/22/10  . CORONARY ANGIOPLASTY  07/21/1999   PTCA to ISR stenosis to LAD  . CORONARY ANGIOPLASTY WITH STENT PLACEMENT  10/17/1998   PCI with ~2.5 mm x 18 mm AVE BMS to D1  . CORONARY ARTERY BYPASS GRAFT N/A 03/21/2014   Procedure: CORONARY ARTERY BYPASS GRAFTING (CABG) TIMES FIVE USING LEFT INTERNAL MAMMARY TO LAD, SAPHENOUS VEIN GRAFT TO THE PD/PL, SAPHENOUS VEIN GRAFT TO DIAGONAL 1 AND 2 SEQUENTIALLY, SAPHENOUS VEIN HARVESTED ENDOSCOPICALLY;  Surgeon: Grace Isaac, MD;  Location: [facility];  Service: Open Heart Surgery;  Laterality: N/A;  . LEFT HEART CATHETERIZATION WITH CORONARY ANGIOGRAM N/A 03/20/2014   Procedure: LEFT HEART CATHETERIZATION WITH CORONARY ANGIOGRAM;  Surgeon: Leonie Man, MD;  [facility]: [facility] C[facility] LAB;  Service: Cardiovascular;  Laterality: N/A;  . NM MYOCAR PERF WALL MOTION  11/06/2011   low risk 63% ; FIXED INFEROSEPTAL GUT ATTENUATION  . PROSTATE BIOPSY  05/23/13   gleason 4+3=7, vol 34.32 cc  . TEE WITHOUT CARDIOVERSION N/A 03/21/2014   Procedure: TRANSESOPHAGEAL ECHOCARDIOGRAM (TEE);  Surgeon: Grace Isaac, MD;  Location: [place];  Service: Open Heart Surgery;  Laterality: N/A;  . TRANSTHORACIC ECHOCARDIOGRAM  Mar 04, 2014   EF 55-60%, Gr 1 DD.  Mildly dilated Ascending Aorta; RV now back to normal    Current Meds  Medication Sig  . allopurinol (ZYLOPRIM) 300 MG tablet Take 300 mg by mouth daily.  Marland Kitchen amLODipine (NORVASC) 10 MG tablet Take 1 tablet (10 mg total) by mouth daily.  Marland Kitchen aspirin EC 81 MG tablet Take 81 mg Mon-Wed-Fri  . celecoxib (CELEBREX) 200  MG capsule Take 200 mg by mouth daily.  Marland Kitchen losartan (COZAAR) 100 MG tablet TAKE 1 TABLET DAILY  . metoprolol tartrate (LOPRESSOR) 25 MG tablet TAKE 1/2 TABLET TWICE A DAY  . omeprazole (PRILOSEC) 40 MG capsule Take 40 mg by mouth daily.  . rosuvastatin (CRESTOR) 20 MG tablet Take 20 mg by mouth daily.    No Known Allergies  Social History   Tobacco Use  . Smoking status: Former Smoker    Packs/day: 2.00    Years: 25.00    Pack years: 50.00    Last attempt to quit: 04/20/1983    Years since quitting: 35.1  . Smokeless tobacco: Never Used  Substance Use Topics  . Alcohol use: Yes    Alcohol/week: 0.0 standard drinks    Comment: 3 a day  . Drug use: No   Social History   Social History Narrative   Retired.    Wife - Zigmund Daniel.  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 No tobacco products.   Enjoys playing golf with his friends - if schedule allows.   Does Maintenance Cardiac Rehab    family history includes Cancer in his brother; Heart attack in his brother and father; Stroke in his mother.  Wt Readings from Last 3 Encounters:  06/16/18 192 lb (87.1 kg)  06/15/17 199 lb (90.3 kg)  12/16/16 198 lb (89.8 kg)    PHYSICAL EXAM BP 126/66   Pulse 72   Ht '6\' 1"'$  (1.854 m)   Wt 192 lb (87.1 kg)   BMI 25.33 kg/m  Physical Exam  Constitutional: He is oriented to person, place, and time. He appears well-developed and well-nourished. No distress.  Well-groomed.  Healthy-appearing.  HENT:  Head: Normocephalic and atraumatic.  Neck: Normal range of motion. Neck supple. No hepatojugular reflux and no JVD present. Carotid bruit is not present.  Cardiovascular: Normal rate, regular rhythm, S1 normal and intact distal pulses. Exam reveals no gallop and no friction rub.  No murmur heard. Split S2  Pulmonary/Chest: Effort normal and breath sounds normal. No respiratory distress. He has no wheezes. He has no rales.  Musculoskeletal: Normal range of motion.        General: No edema.   Neurological: He is alert and oriented to person, place, and time.  Skin: He is not diaphoretic.  Psychiatric: He has a normal mood and affect. His behavior is normal. Judgment and thought content normal.  he is overall in very good spirits. Smiling.     Adult ECG Report  Rate: 72 ;  Rhythm: normal sinus rhythm, premature ventricular contractions (PVC) and 1 degree AVD.  Nonspecific ST and T wave changes.;  Normal axis, intervals and durations.  Narrative Interpretation: Relatively stable, normal EKG   Other studies Reviewed: Additional studies/ records that were reviewed today include:  Recent Labs:  April 28, 2018   TC 184, TG 158, HDL 91, LDL 61  Cr 1.09, borderline elevated AST/ALT of 87 and 88.  Alk phos 81.  ASSESSMENT / PLAN: Problem List Items Addressed This Visit    Atherosclerosis of native coronary artery of native heart without angina pectoris - Primary (Chronic)    After long recovery.  From his CABG, he is doing well.  Is now on cardiac rehab plan doing amazingly well.  No further angina symptoms.  Able to tolerate low-dose beta-blocker only at 12 and half twice daily metoprolol.  He is on amlodipine for antianginal effect along with losartan.  His blood pressure is much better having been started on losartan.  He is on moderate dose atorvastatin and aspirin.      Relevant Orders   EKG 12-Lead (Completed)   Essential hypertension (Chronic)    Blood pressure looks really good on current medications.  No plans to change.      Relevant Orders   EKG 12-Lead (Completed)   Fatigue due to treatment    Doing well on low-dose beta-blocker.      Hyperlipidemia with target LDL less than 70 (Chronic)    Lipids look pretty good however AST and ALT are still a little bit elevated.  Need to monitor while on rosuvastatin.  Pretty much close to goal in the target range of less than 70, but would like to get it less than 50.  Continue current dose statin.      S/P CABG x 5:  LIMA-LAD, SVG-D1-D2, SVG-RPDA-RPL (Chronic)    Pretty much 4 years postop from CABG doing  amazingly well.  Initially complicated by PE and medication intolerance.  Now doing amazingly well in cardiac rehab on stable regimen.  Has not had a Myoview stress test since follow-up, however with him being so active and not having recurrent symptoms, I think were okay holding off doing any surveillance studies.         Current medicines are reviewed at length with the patient today. (+/- concerns) he asked about needing to take daily aspirin since he is taking NSAIDs on occasion. The following changes have been made: - okay to take aspirin Monday Wednesday Friday if he takes an NSAID, he should not take aspirin a day  Patient Instructions  Medication Instructions:   NOT NEEDED If you need a refill on your cardiac medications before your next appointment, please call your pharmacy.   Lab work: NOT NEEDED If you have labs (blood work) drawn today and your tests are completely normal, you will receive your results only by: Marland Kitchen MyChart Message (if you have MyChart) OR . A paper copy in the mail If you have any lab test that is abnormal or we need to change your treatment, we will call you to review the results.  Testing/Procedures: NOT NEEDED  Follow-Up: At [facility], you and your health needs are our priority.  As part of our continuing mission to provide you with exceptional heart care, we have created designated Provider Care Teams.  These Care Teams include your primary Cardiologist (physician) and Advanced Practice Providers (APPs -  Physician Assistants and Nurse Practitioners) who all work together to provide you with the care you need, when you need it. You will need a follow up appointment in  12 MONTH VISIT  .  Please call our office 2 months in advance to schedule this appointment.  You may see Glenetta Hew, MD  or one of the following Advanced Practice Providers on your designated  Care Team:   Rosaria Ferries, PA-C . Jory Sims, DNP, ANP  Any Other Special Instructions Will Be Listed Below (If Applicable).    Studies Ordered:   Orders Placed This Encounter  Procedures  . EKG 12-Lead      Glenetta Hew, M.D., M.S. Interventional Cardiologist   Pager # [phone] Phone # [phone] [address] [place], [place] [address]

## 2018-06-16 NOTE — Patient Instructions (Addendum)
Medication Instructions:   NOT NEEDED If you need a refill on your cardiac medications before your next appointment, please call your pharmacy.   Lab work: NOT NEEDED If you have labs (blood work) drawn today and your tests are completely normal, you will receive your results only by: Marland Kitchen MyChart Message (if you have MyChart) OR . A paper copy in the mail If you have any lab test that is abnormal or we need to change your treatment, we will call you to review the results.  Testing/Procedures: NOT NEEDED  Follow-Up: At [facility], you and your health needs are our priority.  As part of our continuing mission to provide you with exceptional heart care, we have created designated Provider Care Teams.  These Care Teams include your primary Cardiologist (physician) and Advanced Practice Providers (APPs -  Physician Assistants and Nurse Practitioners) who all work together to provide you with the care you need, when you need it. You will need a follow up appointment in  12 MONTH VISIT  .  Please call our office 2 months in advance to schedule this appointment.  You may see Glenetta Hew, MD  or one of the following Advanced Practice Providers on your designated Care Team:   Rosaria Ferries, PA-C . Jory Sims, DNP, ANP  Any Other Special Instructions Will Be Listed Below (If Applicable).

## 2018-06-17 ENCOUNTER — Encounter (HOSPITAL_COMMUNITY)
Admission: RE | Admit: 2018-06-17 | Discharge: 2018-06-17 | Disposition: A | Discharge status: Home / Self Care | Payer: Self-pay | Source: Ambulatory Visit | Attending: Cardiology | Admitting: Cardiology

## 2018-06-19 ENCOUNTER — Encounter: Payer: Self-pay | Admitting: Cardiology

## 2018-06-19 NOTE — Assessment & Plan Note (Signed)
Blood pressure looks really good on current medications.  No plans to change.

## 2018-06-19 NOTE — Assessment & Plan Note (Signed)
After long recovery.  From his CABG, he is doing well.  Is now on cardiac rehab plan doing amazingly well.  No further angina symptoms.  Able to tolerate low-dose beta-blocker only at 12 and half twice daily metoprolol.  He is on amlodipine for antianginal effect along with losartan.  His blood pressure is much better having been started on losartan.  He is on moderate dose atorvastatin and aspirin.

## 2018-06-19 NOTE — Assessment & Plan Note (Signed)
Pretty much 4 years postop from CABG doing amazingly well.  Initially complicated by PE and medication intolerance.  Now doing amazingly well in cardiac rehab on stable regimen.  Has not had a Myoview stress test since follow-up, however with him being so active and not having recurrent symptoms, I think were okay holding off doing any surveillance studies.

## 2018-06-19 NOTE — Assessment & Plan Note (Signed)
Doing well on low-dose beta-blocker.

## 2018-06-19 NOTE — Assessment & Plan Note (Signed)
Lipids look pretty good however AST and ALT are still a little bit elevated.  Need to monitor while on rosuvastatin.  Pretty much close to goal in the target range of less than 70, but would like to get it less than 50.  Continue current dose statin.

## 2018-06-20 ENCOUNTER — Encounter (HOSPITAL_COMMUNITY): Payer: Self-pay

## 2018-06-20 DIAGNOSIS — Z951 Presence of aortocoronary bypass graft: Secondary | ICD-10-CM | POA: Insufficient documentation

## 2018-06-22 ENCOUNTER — Encounter (HOSPITAL_COMMUNITY)
Admission: RE | Admit: 2018-06-22 | Discharge: 2018-06-22 | Disposition: A | Discharge status: Home / Self Care | Payer: Self-pay | Source: Ambulatory Visit | Attending: Cardiology | Admitting: Cardiology

## 2018-06-24 ENCOUNTER — Encounter (HOSPITAL_COMMUNITY): Payer: Self-pay

## 2018-06-27 ENCOUNTER — Encounter (HOSPITAL_COMMUNITY)
Admission: RE | Admit: 2018-06-27 | Discharge: 2018-06-27 | Disposition: A | Discharge status: Home / Self Care | Payer: Self-pay | Source: Ambulatory Visit | Attending: Cardiology | Admitting: Cardiology

## 2018-06-29 ENCOUNTER — Encounter (HOSPITAL_COMMUNITY): Payer: Self-pay

## 2018-07-01 ENCOUNTER — Other Ambulatory Visit: Payer: Self-pay

## 2018-07-01 ENCOUNTER — Encounter (HOSPITAL_COMMUNITY)
Admission: RE | Admit: 2018-07-01 | Discharge: 2018-07-01 | Disposition: A | Discharge status: Home / Self Care | Payer: Self-pay | Source: Ambulatory Visit | Attending: Cardiology | Admitting: Cardiology

## 2018-07-04 ENCOUNTER — Telehealth (HOSPITAL_COMMUNITY): Payer: Self-pay | Admitting: Cardiac Rehabilitation

## 2018-07-04 ENCOUNTER — Encounter (HOSPITAL_COMMUNITY): Payer: Self-pay

## 2018-07-04 NOTE — Telephone Encounter (Signed)
Phone call to patient to notify of CR Phase II departmental closing for 2 weeks.  Pt verbalized understanding.  Betul Brisky, RN, BSN Cardiac Pulmonary Rehab  

## 2018-07-06 ENCOUNTER — Encounter (HOSPITAL_COMMUNITY): Payer: Self-pay

## 2018-07-08 ENCOUNTER — Encounter (HOSPITAL_COMMUNITY): Payer: Self-pay

## 2018-07-11 ENCOUNTER — Encounter (HOSPITAL_COMMUNITY): Payer: Self-pay

## 2018-07-12 ENCOUNTER — Telehealth (HOSPITAL_COMMUNITY): Payer: Self-pay | Admitting: Family Medicine

## 2018-07-13 ENCOUNTER — Encounter (HOSPITAL_COMMUNITY): Payer: Self-pay

## 2018-07-15 ENCOUNTER — Encounter (HOSPITAL_COMMUNITY): Payer: Self-pay

## 2018-07-18 ENCOUNTER — Encounter (HOSPITAL_COMMUNITY): Payer: Self-pay

## 2018-07-20 ENCOUNTER — Encounter (HOSPITAL_COMMUNITY): Payer: Self-pay

## 2018-07-22 ENCOUNTER — Encounter (HOSPITAL_COMMUNITY): Payer: Self-pay

## 2018-07-25 ENCOUNTER — Encounter (HOSPITAL_COMMUNITY): Payer: Self-pay

## 2018-07-27 ENCOUNTER — Encounter (HOSPITAL_COMMUNITY): Payer: Self-pay

## 2018-07-29 ENCOUNTER — Encounter (HOSPITAL_COMMUNITY): Payer: Self-pay

## 2018-07-29 ENCOUNTER — Telehealth (HOSPITAL_COMMUNITY): Payer: Self-pay | Admitting: *Deleted

## 2018-07-29 NOTE — Telephone Encounter (Signed)
Called to notify patient that the cardiac and pulmonary rehabilitation department remains closed at this time due to COVID-19 restrictions. Pt verbalized understanding.  Sol Passer, MS, ACSM Juanito Doom 07/29/2018 [phone]

## 2018-08-01 ENCOUNTER — Encounter (HOSPITAL_COMMUNITY): Payer: Self-pay

## 2018-08-03 ENCOUNTER — Encounter (HOSPITAL_COMMUNITY): Payer: Self-pay

## 2018-08-05 ENCOUNTER — Encounter (HOSPITAL_COMMUNITY): Payer: Self-pay

## 2018-08-08 ENCOUNTER — Encounter (HOSPITAL_COMMUNITY): Payer: Self-pay

## 2018-08-10 ENCOUNTER — Encounter (HOSPITAL_COMMUNITY): Payer: Self-pay

## 2018-08-12 ENCOUNTER — Encounter (HOSPITAL_COMMUNITY): Payer: Self-pay

## 2018-08-15 ENCOUNTER — Encounter (HOSPITAL_COMMUNITY): Payer: Self-pay

## 2018-08-17 ENCOUNTER — Encounter (HOSPITAL_COMMUNITY): Payer: Self-pay

## 2018-08-19 ENCOUNTER — Encounter (HOSPITAL_COMMUNITY): Payer: Self-pay

## 2018-08-22 ENCOUNTER — Encounter (HOSPITAL_COMMUNITY): Payer: Self-pay

## 2018-08-24 ENCOUNTER — Encounter (HOSPITAL_COMMUNITY): Payer: Self-pay

## 2018-08-26 ENCOUNTER — Other Ambulatory Visit: Payer: Self-pay | Admitting: Cardiology

## 2018-08-26 ENCOUNTER — Encounter (HOSPITAL_COMMUNITY): Payer: Self-pay

## 2018-08-29 ENCOUNTER — Encounter (HOSPITAL_COMMUNITY): Payer: Self-pay

## 2018-08-31 ENCOUNTER — Encounter (HOSPITAL_COMMUNITY): Payer: Self-pay

## 2018-09-02 ENCOUNTER — Encounter (HOSPITAL_COMMUNITY): Payer: Self-pay

## 2018-09-05 ENCOUNTER — Encounter (HOSPITAL_COMMUNITY): Payer: Self-pay

## 2018-09-07 ENCOUNTER — Encounter (HOSPITAL_COMMUNITY): Payer: Self-pay

## 2018-09-09 ENCOUNTER — Encounter (HOSPITAL_COMMUNITY): Payer: Self-pay

## 2018-09-14 ENCOUNTER — Encounter (HOSPITAL_COMMUNITY): Payer: Self-pay

## 2018-09-16 ENCOUNTER — Encounter (HOSPITAL_COMMUNITY): Payer: Self-pay

## 2018-09-19 ENCOUNTER — Encounter (HOSPITAL_COMMUNITY): Payer: Self-pay

## 2018-09-21 ENCOUNTER — Encounter (HOSPITAL_COMMUNITY): Payer: Self-pay

## 2018-09-23 ENCOUNTER — Encounter (HOSPITAL_COMMUNITY): Payer: Self-pay

## 2018-09-26 ENCOUNTER — Encounter (HOSPITAL_COMMUNITY): Payer: Self-pay

## 2018-09-28 ENCOUNTER — Encounter (HOSPITAL_COMMUNITY): Payer: Self-pay

## 2018-09-30 ENCOUNTER — Encounter (HOSPITAL_COMMUNITY): Payer: Self-pay

## 2018-10-03 ENCOUNTER — Encounter (HOSPITAL_COMMUNITY): Payer: Self-pay

## 2018-10-05 ENCOUNTER — Encounter (HOSPITAL_COMMUNITY): Payer: Self-pay

## 2018-10-07 ENCOUNTER — Encounter (HOSPITAL_COMMUNITY): Payer: Self-pay

## 2018-10-10 ENCOUNTER — Encounter (HOSPITAL_COMMUNITY): Payer: Self-pay

## 2018-10-12 ENCOUNTER — Encounter (HOSPITAL_COMMUNITY): Payer: Self-pay

## 2018-10-13 ENCOUNTER — Telehealth (HOSPITAL_COMMUNITY): Payer: Self-pay

## 2018-10-13 NOTE — Telephone Encounter (Signed)
Attempted to call pt regarding CR maintenance program. Program is still on hold due COVID 19 pandemic. Unable to reach pt. Left VM.  Carma Lair MS, ACSM CEP 4:15 PM 10/13/2018

## 2018-10-14 ENCOUNTER — Encounter (HOSPITAL_COMMUNITY): Payer: Self-pay

## 2018-10-17 ENCOUNTER — Encounter (HOSPITAL_COMMUNITY): Payer: Self-pay

## 2018-12-15 ENCOUNTER — Other Ambulatory Visit: Payer: Self-pay | Admitting: Cardiology

## 2019-04-11 ENCOUNTER — Telehealth (HOSPITAL_COMMUNITY): Payer: Self-pay | Admitting: *Deleted

## 2019-04-11 NOTE — Telephone Encounter (Signed)
Contacted patient to update status of return to exercise in the cardiac rehab maintenance program. Patient aware that we will be temporarily suspending onsite maintenance exercise classes and would like to be contacted once we resume.  Sol Passer, MS, ACSM CEP 04/11/2019 1327

## 2019-05-20 ENCOUNTER — Ambulatory Visit: Payer: Medicare HMO

## 2019-05-25 ENCOUNTER — Ambulatory Visit: Payer: Medicare HMO | Attending: Internal Medicine

## 2019-05-25 DIAGNOSIS — Z23 Encounter for immunization: Secondary | ICD-10-CM | POA: Insufficient documentation

## 2019-05-25 NOTE — Progress Notes (Signed)
   Covid-19 Vaccination Clinic  Name:  Robert Boyle    MRN: [id number] DOB: 02-Mar-1936  05/25/2019  [name] was observed post Covid-19 immunization for 15 minutes without incidence. He was provided with Vaccine Information Sheet and instruction to access the V-Safe system.   [name] was instructed to call 911 with any severe reactions post vaccine: Marland Kitchen Difficulty breathing  . Swelling of your face and throat  . A fast heartbeat  . A bad rash all over your body  . Dizziness and weakness    Immunizations Administered    Name Date Dose VIS Date Route   Pfizer COVID-19 Vaccine 05/25/2019 12:05 PM 0.3 mL 03/31/2019 Intramuscular   Manufacturer: [place]   Lot: [id number]   [place]: [id number]

## 2019-06-19 ENCOUNTER — Ambulatory Visit: Payer: Medicare HMO | Admitting: Cardiology

## 2019-06-19 ENCOUNTER — Ambulatory Visit: Payer: Medicare HMO | Attending: Internal Medicine

## 2019-06-19 DIAGNOSIS — Z23 Encounter for immunization: Secondary | ICD-10-CM | POA: Insufficient documentation

## 2019-06-19 HISTORY — PX: NM MYOVIEW LTD: HXRAD82

## 2019-06-19 NOTE — Progress Notes (Signed)
   Covid-19 Vaccination Clinic  Name:  Robert Boyle    MRN: [id number] DOB: 10-May-1935  06/19/2019  [name] was observed post Covid-19 immunization for 15 minutes without incidence. He was provided with Vaccine Information Sheet and instruction to access the V-Safe system.   [name] was instructed to call 911 with any severe reactions post vaccine: Marland Kitchen Difficulty breathing  . Swelling of your face and throat  . A fast heartbeat  . A bad rash all over your body  . Dizziness and weakness    Immunizations Administered    Name Date Dose VIS Date Route   Pfizer COVID-19 Vaccine 06/19/2019 11:25 AM 0.3 mL 03/31/2019 Intramuscular   Manufacturer: [place]   Lot: [id number]   [place]: [id number]

## 2019-06-20 ENCOUNTER — Other Ambulatory Visit: Payer: Self-pay | Admitting: Cardiology

## 2019-06-29 ENCOUNTER — Other Ambulatory Visit: Payer: Self-pay

## 2019-06-29 ENCOUNTER — Encounter: Payer: Self-pay | Admitting: Cardiology

## 2019-06-29 ENCOUNTER — Ambulatory Visit (INDEPENDENT_AMBULATORY_CARE_PROVIDER_SITE_OTHER): Payer: Medicare HMO | Admitting: Cardiology

## 2019-06-29 VITALS — BP 112/54 | HR 71 | Ht 73.0 in | Wt 188.4 lb

## 2019-06-29 DIAGNOSIS — Z951 Presence of aortocoronary bypass graft: Secondary | ICD-10-CM

## 2019-06-29 DIAGNOSIS — I25119 Atherosclerotic heart disease of native coronary artery with unspecified angina pectoris: Secondary | ICD-10-CM | POA: Diagnosis not present

## 2019-06-29 DIAGNOSIS — I10 Essential (primary) hypertension: Secondary | ICD-10-CM | POA: Diagnosis not present

## 2019-06-29 DIAGNOSIS — R5383 Other fatigue: Secondary | ICD-10-CM

## 2019-06-29 DIAGNOSIS — E785 Hyperlipidemia, unspecified: Secondary | ICD-10-CM | POA: Diagnosis not present

## 2019-06-29 NOTE — Patient Instructions (Signed)
Medication Instructions:  NO CHANGES  *If you need a refill on your cardiac medications before your next appointment, please call your pharmacy*   Lab Work: N/A   Testing/Procedures: Your physician has requested that you have a lexiscan myoview.  Please follow instruction sheet, as given.    Follow-Up: At [facility], you and your health needs are our priority.  As part of our continuing mission to provide you with exceptional heart care, we have created designated Provider Care Teams.  These Care Teams include your primary Cardiologist (physician) and Advanced Practice Providers (APPs -  Physician Assistants and Nurse Practitioners) who all work together to provide you with the care you need, when you need it.  We recommend signing up for the patient portal called "MyChart".  Sign up information is provided on this After Visit Summary.  MyChart is used to connect with patients for Virtual Visits (Telemedicine).  Patients are able to view lab/test results, encounter notes, upcoming appointments, etc.  Non-urgent messages can be sent to your provider as well.   To learn more about what you can do with MyChart, go to [email or web address].    Your next appointment:   12 month(s)  The format for your next appointment:   In Person  Provider:   Glenetta Hew, MD   Other Instructions N/A

## 2019-06-29 NOTE — Progress Notes (Signed)
Primary Care Provider: Chesley Noon, MD Cardiologist: Glenetta Hew, MD Electrophysiologist: None  Clinic Note: Chief Complaint  Patient presents with  . Follow-up    Annual  . Coronary Artery Disease    No angina    HPI:    Robert Boyle is a 84 y.o. male with a PMH notable for history of CAD-CABG (December [id number] complicated by postop saddle PE) who presents today for annual follow-up.  Robert Boyle was a long-term patient and friend of Dr. Aldona Bar.  He was referred for progressive anginal symptoms around Thanksgiving of 2015.  He went for cardiac catheterization which revealed multivessel disease and was for for CABG.  Following his CABG she had a large pulmonary embolism that set him back for about 2 months.  He did complete his full year of anticoagulation.  Ever since recovering from his PE, he has consistently gone to rehab maintenance program.  Robert Boyle was last seen on June 16, 2018 (prior to the COVID-19 lockdown).  He was doing very well at that time.  Had lost about 7 pounds.  Was eating a healthy diet and walking about 12 miles a week in cardiac rehab.  Still enjoying start rehab.  Indicated he was feeling "great ".  No heart symptoms.  Only limited by intermittent back pain.-->  No changes  Recent Hospitalizations: None  Reviewed  CV studies:    The following studies were reviewed today: (if available, images/films reviewed: From Epic Chart or Care Everywhere) . None:   Interval History:   Robert Boyle returns here for his routine annual follow-up only admits about the fact that he has not been able to do his cardiac rehab.  He genuinely misses his 3 times a week exercise with rehab.  He misses the camaraderie but he also notes that he just does not have the motivation to get out and do the walking on his own.  He is doing the best he can, but is just not getting that less intensity of exercise.  Partially because he is fearful of doing  more without supervision, but also it seems to be less fun -> he limits his exercise to walking in the neighborhood, but this is limited by the weather.Marland Kitchen  Despite the fact that he has not really been doing as much exercise, he is actually been eating better foods, but also less-have indicating that his appetite is actually down.   He does indicate that his energy improved since we reduced his beta-blocker dose, and his blood pressures have actually been stable.  CV Review of Symptoms (Summary): no chest pain or dyspnea on exertion positive for - Mild end of day swelling in his feet.  Occasional skipped beats. negative for - chest pain, dyspnea on exertion, orthopnea, paroxysmal nocturnal dyspnea, rapid heart rate, shortness of breath or Syncope/near syncope, TIA shows amaurosis fugax, claudication  The patient does not have symptoms concerning for COVID-19 infection (fever, chills, cough, or new shortness of breath).  The patient is practicing social distancing & Masking.  He has completed both rounds of COVID-19 vaccine second dose on March 1.  He was tested back in December when his granddaughter had tested positive.  REVIEWED OF SYSTEMS   Review of Systems  Constitutional: Positive for weight loss (Intentional). Negative for malaise/fatigue (Energy she is any better since backing off beta-blocker dose.).  HENT: Negative for nosebleeds.   Respiratory: Negative for shortness of breath.   Gastrointestinal: Negative for blood in  stool and melena.  Genitourinary: Negative for hematuria.  Musculoskeletal: Positive for joint pain (Diffuse joint pains here and there).  Neurological: Negative for dizziness, focal weakness, weakness and headaches.  Psychiatric/Behavioral: Negative for depression and memory loss. The patient is not nervous/anxious and does not have insomnia.     I have reviewed and (if needed) personally updated the patient's problem list, medications, allergies, past medical and  surgical history, social and family history.   PAST MEDICAL HISTORY   Past Medical History:  Diagnosis Date  . Acute pulmonary embolus ([place]) 04/13/14   CTA: Bilateral Main PAs; suggestion of increased RV strain & R basal pulmonary infarct,  Mild dilation of the ascending aorta 2.8 cm noted. --> On Xarelto  . Arthritis   . Arthritis   . CAD S/P percutaneous coronary angioplasty 2000, 2001, 03/2014   a. 2000: BMS to D1 75% stenosis (~2.5 mm x 18 mm AVE BMS); b. 2001 ISR of D1 stent - PTCA w/ 2.5 mm Balloon;; c. Myoview 11/'15: Intermediate Risk, inferior-inferolateral ischemia --> Cath: 40% proximal LAD with 90% mid; 90% mid D1 and D2; 100% proximal RCA with left-to-right collaterals for both PL and PDA --> CABG  d. CABG 03/2014: LIMA-LAD, SVG-D1-D2, and SVG sequentially to PDA and PLB.  . Essential hypertension   . Gastrointestinal complaints   . GERD (gastroesophageal reflux disease)   . Gout   . H/O renal calculi   . History of radiation therapy 09/06/13- 11/02/13   prostate 7600 cGy 40 sessions, seminal vesicles 5600 cGy 40 sessions  . Hyperlipidemia with target LDL less than 70   . Prostate cancer ([place]) 05/23/13   gleason 4+3=7, vloume 34.32 cc; s/p Chemo-XRT  . 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 Shortness of breath dyspnea     PAST SURGICAL HISTORY   Past Surgical History:  Procedure Laterality Date  . BACK SURGERY  1995   lumbar  . Carotid & Upper Extr. Ultrasound  July 2016   Mild Bilateral fibrous plaque in ICAs, normal Vertebral flow, No SCA stenosis  . CHOLECYSTECTOMY  01/22/10  . CORONARY ANGIOPLASTY  07/21/1999   PTCA to ISR stenosis to LAD  . CORONARY ANGIOPLASTY WITH STENT PLACEMENT  10/17/1998   PCI with ~2.5 mm x 18 mm AVE BMS to D1  . CORONARY ARTERY BYPASS GRAFT N/A 03/21/2014   Procedure: CORONARY ARTERY BYPASS GRAFTING (CABG) TIMES FIVE USING LEFT INTERNAL MAMMARY TO LAD, SAPHENOUS VEIN GRAFT TO THE PD/PL, SAPHENOUS VEIN GRAFT TO  DIAGONAL 1 AND 2 SEQUENTIALLY, SAPHENOUS VEIN HARVESTED ENDOSCOPICALLY;  Surgeon: Grace Isaac, MD;  Location: [facility];  Service: Open Heart Surgery;  Laterality: N/A;  . LEFT HEART CATHETERIZATION WITH CORONARY ANGIOGRAM N/A 03/20/2014   Procedure: LEFT HEART CATHETERIZATION WITH CORONARY ANGIOGRAM;  Surgeon: Leonie Man, MD;  Location: [facility] C[facility];  Service: Cardiovascular;  Laterality: N/A;  . NM MYOCAR PERF WALL MOTION  11/06/2011   low risk 63% ; FIXED INFEROSEPTAL GUT ATTENUATION  . PROSTATE BIOPSY  05/23/13   gleason 4+3=7, vol 34.32 cc  . TEE WITHOUT CARDIOVERSION N/A 03/21/2014   Procedure: TRANSESOPHAGEAL ECHOCARDIOGRAM (TEE);  Surgeon: Grace Isaac, MD;  Location: [facility];  Service: Open Heart Surgery;  Laterality: N/A;  . TRANSTHORACIC ECHOCARDIOGRAM  Mar 04, 2014   EF 55-60%, Gr 1 DD.  Mildly dilated Ascending Aorta; RV now back to normal    MEDICATIONS/ALLERGIES   Current Meds  Medication Sig  . allopurinol (ZYLOPRIM) 300  MG tablet Take 300 mg by mouth daily.  Marland Kitchen amLODipine (NORVASC) 10 MG tablet TAKE 1 TABLET DAILY  . aspirin EC 81 MG tablet Take 81 mg Mon-Wed-Fri  . celecoxib (CELEBREX) 200 MG capsule Take 200 mg by mouth daily.  Marland Kitchen losartan (COZAAR) 100 MG tablet TAKE 1 TABLET DAILY  . metoprolol tartrate (LOPRESSOR) 25 MG tablet TAKE 1/2 TABLET TWICE A DAY  . omeprazole (PRILOSEC) 40 MG capsule Take 40 mg by mouth daily.  . rosuvastatin (CRESTOR) 20 MG tablet Take 20 mg by mouth daily.    No Known Allergies  SOCIAL HISTORY/FAMILY HISTORY   Reviewed in Epic:  Pertinent findings:   OBJCTIVE -PE, EKG, labs   Wt Readings from Last 3 Encounters:  06/29/19 188 lb 6.4 oz (85.5 kg)  06/16/18 192 lb (87.1 kg)  06/15/17 199 lb (90.3 kg)    Physical Exam: BP (!) 112/54   Pulse 71   Ht 6\' 1"  (1.854 m)   Wt 188 lb 6.4 oz (85.5 kg)   BMI 24.86 kg/m  Physical Exam  Constitutional: He is oriented to person, place, and time. He appears well-developed and  well-nourished. No distress.  Pleasant elderly gentleman.  Appears younger than stated age.  Notable weight loss.  Well-groomed.  HENT:  Head: Normocephalic and atraumatic.  Neck: No hepatojugular reflux and no JVD present. Carotid bruit is not present.  Cardiovascular: Normal rate, regular rhythm, normal heart sounds and intact distal pulses.  No extrasystoles are present. PMI is not displaced. Exam reveals no gallop and no friction rub.  No murmur heard. Pulmonary/Chest: Effort normal and breath sounds normal. No respiratory distress. He has no wheezes. He has no rales.  Abdominal: Soft. Bowel sounds are normal. He exhibits no distension. There is no abdominal tenderness. There is no rebound.  No HSM  Musculoskeletal:        General: No edema. Normal range of motion.     Cervical back: Normal range of motion and neck supple.  Neurological: He is alert and oriented to person, place, and time.  Psychiatric: He has a normal mood and affect. His behavior is normal. Judgment and thought content normal.  Vitals reviewed.    Adult ECG Report  Rate: 71 ;  Rhythm: normal sinus rhythm and First-degree AV conduction delay.  Nonspecific ST-T wave changes.  Borderline low voltage.  Otherwise normal axis, intervals and durations.;   Narrative Interpretation: Stable EKG  Recent Labs: Reviewed in Care Everywhere May 01, 2019  TC 187, TG 112, HDL 116, LDL 52.  (Stable to improved from previous) Na+ 144, K+ 4.4, Cl- 104, HCO3-25, BUN 13, Cr 0.91, Glu 75; AST 44, ALT 40, AlkP 76  ASSESSMENT/PLAN    Problem List Items Addressed This Visit    S/P CABG x 5: LIMA-LAD, SVG-D1-D2, SVG-RPDA-RPL (Chronic)    He is now 5 years post CABG.  We discussed potentially doing surveillance Myoview testing.  He has been less active over the last year, and he is a little bit leery about getting back into cardiac rehab when it starts back up again.  Plan: We will check Myoview stress test (preferentially treadmill  if possible) to assess his exercise tolerance as well as any evidence of ischemia.  If this test is nonischemic, I probably would not do another surveillance test on him, and would save testing for symptoms only.      Relevant Orders   EKG 12-Lead (Completed)   MYOCARDIAL PERFUSION IMAGING   Atherosclerosis of native coronary  artery of native heart with angina pectoris ([place]) - Primary (Chronic)    Multivessel disease noted around Thanksgiving 2015, referred for CABG.  Unfortunately his post CABG course was complicated by pulmonary embolism leading to prolonged recovery.  However he is now done amazingly well since that timeframe.  No further angina.  Has been very active with exercising.  Hoping to get back into the cardiac rehab maintenance program.  Plan: Continue current medication regimen.  Continue aspirin 81 mg and current dose of rosuvastatin.  Tolerating reduced dose of metoprolol tartrate much better than he was with a higher dose.  We are using amlodipine for antianginal benefit along with losartan.       Hyperlipidemia with target LDL less than 70 (Chronic)    Lipids were just checked just over a month ago and LDL was outstanding at 52.  He is doing great with weight loss as well as continuing his exercise despite not going to the cardiac center.  He is on a stable dose of rosuvastatin with no side effects.  Plan: Continue diet and exercise along with rosuvastatin.  No change      Relevant Orders   EKG 12-Lead (Completed)   MYOCARDIAL PERFUSION IMAGING   Essential hypertension (Chronic)    Blood pressure looks outstanding.  Seems to have done fine with lower dose of beta-blocker.  On max dose losartan and amlodipine.  Not on diuretic, but seems euvolemic.      Relevant Orders   EKG 12-Lead (Completed)   MYOCARDIAL PERFUSION IMAGING   Fatigue due to treatment    With a combination having switched from atorvastatin to rosuvastatin and reducing beta-blocker dose, his  energy level seems to have improved substantially.  Unless he has evidence of chronotropic incompetence on the treadmill portion of his Myoview, will probably would continue beta-blocker dose.         COVID-19 Education: The signs and symptoms of COVID-19 were discussed with the patient and how to seek care for testing (follow up with PCP or arrange E-visit).   The importance of social distancing was discussed today.  I spent a total of 48minutes with the patient. >  50% of the time was spent in direct patient consultation.  Additional time spent with chart review  / charting (studies, outside notes, etc): 6 Total Time: 24 min   Current medicines are reviewed at length with the patient today.  (+/- concerns) none   Patient Instructions / Medication Changes & Studies & Tests Ordered   Patient Instructions  Medication Instructions:  NO CHANGES  *If you need a refill on your cardiac medications before your next appointment, please call your pharmacy*   Lab Work: N/A   Testing/Procedures: Your physician has requested that you have a lexiscan myoview.  Please follow instruction sheet, as given.    Follow-Up: At [facility], you and your health needs are our priority.  As part of our continuing mission to provide you with exceptional heart care, we have created designated Provider Care Teams.  These Care Teams include your primary Cardiologist (physician) and Advanced Practice Providers (APPs -  Physician Assistants and Nurse Practitioners) who all work together to provide you with the care you need, when you need it.  We recommend signing up for the patient portal called "MyChart".  Sign up information is provided on this After Visit Summary.  MyChart is used to connect with patients for Virtual Visits (Telemedicine).  Patients are able to view lab/test results, encounter notes, upcoming  appointments, etc.  Non-urgent messages can be sent to your provider as well.   To learn  more about what you can do with MyChart, go to [email or web address].    Your next appointment:   12 month(s)  The format for your next appointment:   In Person  Provider:   Glenetta Hew, MD   Other Instructions N/A    Studies Ordered:   Orders Placed This Encounter  Procedures  . MYOCARDIAL PERFUSION IMAGING  . EKG 12-Lead     Glenetta Hew, M.D., M.S. Interventional Cardiologist   Pager # [phone] Phone # [phone] [address] [place], [place] [address]   Thank you for choosing Heartcare at [facility]!!

## 2019-07-02 ENCOUNTER — Encounter: Payer: Self-pay | Admitting: Cardiology

## 2019-07-02 NOTE — Assessment & Plan Note (Signed)
Blood pressure looks outstanding.  Seems to have done fine with lower dose of beta-blocker.  On max dose losartan and amlodipine.  Not on diuretic, but seems euvolemic.

## 2019-07-02 NOTE — Assessment & Plan Note (Signed)
He is now 5 years post CABG.  We discussed potentially doing surveillance Myoview testing.  He has been less active over the last year, and he is a little bit leery about getting back into cardiac rehab when it starts back up again.  Plan: We will check Myoview stress test (preferentially treadmill if possible) to assess his exercise tolerance as well as any evidence of ischemia.  If this test is nonischemic, I probably would not do another surveillance test on him, and would save testing for symptoms only.

## 2019-07-02 NOTE — Assessment & Plan Note (Signed)
Lipids were just checked just over a month ago and LDL was outstanding at 52.  He is doing great with weight loss as well as continuing his exercise despite not going to the cardiac center.  He is on a stable dose of rosuvastatin with no side effects.  Plan: Continue diet and exercise along with rosuvastatin.  No change

## 2019-07-02 NOTE — Assessment & Plan Note (Signed)
With a combination having switched from atorvastatin to rosuvastatin and reducing beta-blocker dose, his energy level seems to have improved substantially.  Unless he has evidence of chronotropic incompetence on the treadmill portion of his Myoview, will probably would continue beta-blocker dose.

## 2019-07-02 NOTE — Assessment & Plan Note (Addendum)
Multivessel disease noted around Thanksgiving 2015, referred for CABG.  Unfortunately his post CABG course was complicated by pulmonary embolism leading to prolonged recovery.  However he is now done amazingly well since that timeframe.  No further angina.  Has been very active with exercising.  Hoping to get back into the cardiac rehab maintenance program.  Plan: Continue current medication regimen.  Continue aspirin 81 mg and current dose of rosuvastatin.  Tolerating reduced dose of metoprolol tartrate much better than he was with a higher dose.  We are using amlodipine for antianginal benefit along with losartan.

## 2019-07-05 ENCOUNTER — Telehealth (HOSPITAL_COMMUNITY): Payer: Self-pay

## 2019-07-05 NOTE — Telephone Encounter (Signed)
Encounter complete. 

## 2019-07-07 ENCOUNTER — Other Ambulatory Visit: Payer: Self-pay

## 2019-07-07 ENCOUNTER — Ambulatory Visit (HOSPITAL_COMMUNITY)
Admission: RE | Admit: 2019-07-07 | Discharge: 2019-07-07 | Disposition: A | Discharge status: Home / Self Care | Payer: Medicare HMO | Source: Ambulatory Visit | Attending: Cardiology | Admitting: Cardiology

## 2019-07-07 DIAGNOSIS — I10 Essential (primary) hypertension: Secondary | ICD-10-CM | POA: Diagnosis present

## 2019-07-07 DIAGNOSIS — E785 Hyperlipidemia, unspecified: Secondary | ICD-10-CM | POA: Diagnosis present

## 2019-07-07 DIAGNOSIS — Z951 Presence of aortocoronary bypass graft: Secondary | ICD-10-CM

## 2019-07-07 LAB — MYOCARDIAL PERFUSION IMAGING
LV dias vol: 75 mL (ref 62–150)
LV sys vol: 31 mL
Peak HR: 96 {beats}/min
Rest HR: 78 {beats}/min
SDS: 2
SRS: 6
SSS: 8
TID: 0.89

## 2019-07-07 MED ORDER — TECHNETIUM TC 99M TETROFOSMIN IV KIT
9.2000 | PACK | Freq: Once | INTRAVENOUS | Status: AC | PRN
  Administered 2019-07-07: 9.2 via INTRAVENOUS
  Filled 2019-07-07: qty 10

## 2019-07-07 MED ORDER — REGADENOSON 0.4 MG/5ML IV SOLN
0.4000 mg | Freq: Once | INTRAVENOUS | Status: AC
  Administered 2019-07-07: 0.4 mg via INTRAVENOUS

## 2019-07-07 MED ORDER — TECHNETIUM TC 99M TETROFOSMIN IV KIT
31.9000 | PACK | Freq: Once | INTRAVENOUS | Status: AC | PRN
  Administered 2019-07-07: 31.9 via INTRAVENOUS
  Filled 2019-07-07: qty 32

## 2019-08-01 ENCOUNTER — Telehealth (HOSPITAL_COMMUNITY): Payer: Self-pay | Admitting: Family Medicine

## 2019-08-01 ENCOUNTER — Telehealth (HOSPITAL_COMMUNITY): Payer: Self-pay | Admitting: *Deleted

## 2019-08-02 ENCOUNTER — Telehealth (HOSPITAL_COMMUNITY): Payer: Self-pay | Admitting: Family Medicine

## 2019-08-09 ENCOUNTER — Telehealth (HOSPITAL_COMMUNITY): Payer: Self-pay | Admitting: *Deleted

## 2020-01-03 ENCOUNTER — Other Ambulatory Visit: Payer: Self-pay | Admitting: Cardiology

## 2020-05-15 ENCOUNTER — Telehealth: Payer: Self-pay | Admitting: Cardiology

## 2020-05-15 MED ORDER — AMLODIPINE BESYLATE 10 MG PO TABS: 10.0000 mg | ORAL_TABLET | Freq: Every day | ORAL | 0 refills | Status: DC

## 2020-05-15 NOTE — Telephone Encounter (Signed)
*  STAT* If patient is at the pharmacy, call can be transferred to refill team.   1. Which medications need to be refilled? (please list name of each medication and dose if known) amLODipine (NORVASC) 10 MG tablet  2. Which pharmacy/location (including street and city if local pharmacy) is medication to be sent to? [facility] [place], [place] AT Portal to Registered [employer] Sites  3. Do they need a 30 day or 90 day supply? 90 day supply   Will be out of medication next Tuesday

## 2020-05-15 NOTE — Telephone Encounter (Signed)
Rx has been sent to the pharmacy electronically. ° °

## 2020-07-31 ENCOUNTER — Telehealth: Payer: Self-pay | Admitting: Cardiology

## 2020-07-31 MED ORDER — METOPROLOL TARTRATE 25 MG PO TABS: 12.5000 mg | ORAL_TABLET | Freq: Two times a day (BID) | ORAL | 0 refills | Status: DC

## 2020-07-31 NOTE — Telephone Encounter (Signed)
Pt c/o medication issue:  1. Name of Medication: metoprolol tartrate (LOPRESSOR) 25 MG tablet  2. How are you currently taking this medication (dosage and times per day)? Half a tablet twice daily  3. Are you having a reaction (difficulty breathing--STAT)? no  4. What is your medication issue? Patient states the medication needs authorization from his insurance. He states [employer]'s number is: [phone]

## 2020-07-31 NOTE — Telephone Encounter (Signed)
RN called the [employer]- no assistance - RN kept getting transferred to multiply  Departments   Rn reviewed medication in question and called patient  Medication-- Metoprolol tartrate  12.5 mg  ( 1/2 of 25 mg tablet ) twice a day--  need to be  Refill . The yearly  prescription had expired.  patient states he called mail order and received a e-mail  Stating no response for refill of medication.  Rn informed patient  Refill  Metoprolol  For 90 day supply  No refills. Patient has an appointment in June 2022 - medication will be refilled for a year at that time. Patient verbalized understanding.

## 2020-08-13 ENCOUNTER — Other Ambulatory Visit: Payer: Self-pay

## 2020-08-13 MED ORDER — LOSARTAN POTASSIUM 100 MG PO TABS: 1.0000 | ORAL_TABLET | Freq: Every day | ORAL | 1 refills | Status: DC

## 2020-08-20 ENCOUNTER — Other Ambulatory Visit: Payer: Self-pay | Admitting: Cardiology

## 2020-09-23 ENCOUNTER — Other Ambulatory Visit (HOSPITAL_BASED_OUTPATIENT_CLINIC_OR_DEPARTMENT_OTHER): Payer: Self-pay

## 2020-09-23 ENCOUNTER — Other Ambulatory Visit: Payer: Self-pay

## 2020-09-23 ENCOUNTER — Ambulatory Visit: Payer: Medicare HMO | Attending: Internal Medicine

## 2020-09-23 DIAGNOSIS — Z23 Encounter for immunization: Secondary | ICD-10-CM

## 2020-09-23 MED ORDER — PFIZER-BIONT COVID-19 VAC-TRIS 30 MCG/0.3ML IM SUSP
INTRAMUSCULAR | 0 refills | Status: DC
  Filled 2020-09-23: qty 0.3, 1d supply, fill #0

## 2020-09-23 NOTE — Progress Notes (Signed)
   Covid-19 Vaccination Clinic  Name:  Robert Boyle    MRN: [id number] DOB: Jul 07, 1935  09/23/2020  [name] was observed post Covid-19 immunization for 15 minutes without incident. He was provided with Vaccine Information Sheet and instruction to access the V-Safe system.   [name] was instructed to call 911 with any severe reactions post vaccine: Marland Kitchen Difficulty breathing  . Swelling of face and throat  . A fast heartbeat  . A bad rash all over body  . Dizziness and weakness   Immunizations Administered    Name Date Dose VIS Date Route   PFIZER Comrnaty(Gray TOP) Covid-19 Vaccine 09/23/2020 12:12 PM 0.3 mL 03/28/2020 Intramuscular   Manufacturer: [employer]   Lot: [id number]   [place]: [phone]

## 2020-09-24 ENCOUNTER — Other Ambulatory Visit: Payer: Self-pay | Admitting: *Deleted

## 2020-09-24 MED ORDER — AMLODIPINE BESYLATE 10 MG PO TABS: 1.0000 | ORAL_TABLET | Freq: Every day | ORAL | 0 refills | Status: DC

## 2020-10-14 ENCOUNTER — Other Ambulatory Visit: Payer: Self-pay

## 2020-10-14 ENCOUNTER — Ambulatory Visit: Payer: Medicare HMO | Admitting: Cardiology

## 2020-10-14 ENCOUNTER — Encounter: Payer: Self-pay | Admitting: Cardiology

## 2020-10-14 VITALS — BP 128/68 | HR 78 | Ht 73.0 in | Wt 179.4 lb

## 2020-10-14 DIAGNOSIS — Z951 Presence of aortocoronary bypass graft: Secondary | ICD-10-CM

## 2020-10-14 DIAGNOSIS — I10 Essential (primary) hypertension: Secondary | ICD-10-CM

## 2020-10-14 DIAGNOSIS — E785 Hyperlipidemia, unspecified: Secondary | ICD-10-CM

## 2020-10-14 DIAGNOSIS — I25119 Atherosclerotic heart disease of native coronary artery with unspecified angina pectoris: Secondary | ICD-10-CM | POA: Diagnosis not present

## 2020-10-14 DIAGNOSIS — Z86711 Personal history of pulmonary embolism: Secondary | ICD-10-CM

## 2020-10-14 DIAGNOSIS — R5383 Other fatigue: Secondary | ICD-10-CM

## 2020-10-14 MED ORDER — AMLODIPINE BESYLATE 10 MG PO TABS: 10.0000 mg | ORAL_TABLET | Freq: Every day | ORAL | 3 refills | Status: DC

## 2020-10-14 NOTE — Progress Notes (Signed)
Primary Care Provider: Chesley Noon, MD Cardiologist: Robert Hew, MD Electrophysiologist: None  Clinic Note: Chief Complaint  Patient presents with   Follow-up    Delayed.  Annual follow-up   Coronary Artery Disease    NO angina - back exercising @ Gym - Feels GREAT!.  Energy great.  Lost ~10 lb     ===================================  ASSESSMENT/PLAN   Problem List Items Addressed This Visit     Fatigue due to treatment    He definitely felt better with reducing his atorvastatin dse and metoprolol doses.  Thankfully, his lipids have remained stable, and his BP is doing better with amlodipine.  He claims that his energy level is now is good it was that was when he retired from work.       S/P CABG x 5: LIMA-LAD, SVG-D1-D2, SVG-RPDA-RPL (Chronic)    He is now 6-1/2 years out from his CABG.  Surveillance Myoview was unremarkable. He is now thriving, doing better than he had been for years.  Very happy about rejoining the gym [employer] [place].  After his last Myoview, I think we can probably avoid further testing unless he has symptoms.       Relevant Orders   EKG 12-Lead (Completed)   Atherosclerosis of native coronary artery of native heart with angina pectoris ([place]) - Primary (Chronic)    He took quite a long time to recover from his CABG, but once he did he has been doing remarkably well.  He has not had any further anginal symptoms or heart failure symptoms for that matter.  He has Myoview was pretty much unremarkable.  Read as intermediate risk probably because of there being a fixed defect.  If anything this probably looks more like septal wall motion defect from post CABG for he potentially had a small MI which led to his initial presentation.  Regardless, compared to his pre-CABG Myoview, this is relatively unremarkable.  The fact that there was no significant wall motion abnormality would also argue that it was probably artifact.  He is very  active, in fact more active now that he was supposed CABG.  Plan:  Continue low-dose beta-blocker for reasons noted -> higher doses led to bradycardia and fatigue With requirement of lower dose beta-blocker rehab added amlodipine and titrated to 10 mg daily.  We will change prescription to 90 days. Continue ARB. Continue statin Continue aspirin which he is taking 81 mg 3 days a week, this allows him to take his Celebrex daily and not have excessive bleeding or bruising.       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  Recovered well.  No signs of residual elevated PAP.  Completed his full year of Xarelto.  Is now on aspirin alone.  He is taking it 3 times a week which allows him to take his Celebrex as well.       Hyperlipidemia with target LDL less than 70 (Chronic)    Unfortunate do not have his lipids for this year, LDL was well controlled as of January of last year.  He is due to get labs checked next month.  He has not changed his 20 mg rosuvastatin so we will continue this for now until we see the results.  I suspect with 10 pound weight loss and increased exercise with dietary changes, that it should be stable.  Relevant Medications   amLODipine (NORVASC) 10 MG tablet   Essential hypertension (Chronic)    Excellent troll on amlodipine 10 mg, and 100 mg losartan along with 12.5 mg twice daily Lopressor.  The reduced dose of blood pressure is really helped his energy level.  Also avoiding bradycardia.       Relevant Medications   amLODipine (NORVASC) 10 MG tablet   Other Relevant Orders   EKG 12-Lead (Completed)    ===================================  HPI:    Robert Boyle is a 85 y.o. male with a PMH most notable for CAD-MI-CABG - complicated by Saddle PE (Nov-Dec 2015 --after protracted recovery, has done remarkably well) who presents today for delayed annual  follow-up.  He is a long-term friend of Dr. Aldona Boyle who referred him to me for evaluation just after Thanksgiving of 2015.  Based on his suggestion, we actually took him directly to the Cath Lab where he was found to have multivessel disease and possible recent MI.  He was referred for CABG.  Shortly after CABG, he was readmitted for large saddle PE, and he had a protracted difficult recovery.  Once he was able to get into the cardiac rehab program, he started to thrive, and has done very well ever since.Robert Boyle was last seen on in March 2021 for routine follow-up.  He was doing well, only admitting that he really was missing his cardiac rehab classes.  He missed the camaraderie and was lacking the motivation to do the exercises on his own.  He was doing the best he can, joining a gym, but was somewhat scared of doing exercise without supervision.  He also noted was just not as fine.  He was doing well with diet and exercise though eating better and had lost some weight.  Not having any anginal symptoms.  Mild end of day swelling and occasional skipped beats was about the only thing he noted.  Energy level was doing great.  More limited by mild arthritis pains and anything else. => Screening Myoview ordered, but no med changes.   Recent Hospitalizations: none  Reviewed  CV studies:    The following studies were reviewed today: (if available, images/films reviewed: From Epic Chart or Care Everywhere) Myoview March 2021: INTERMEDIATE RISK STUDY b/c size of prior infarct. EF 55-60%. TWI noted in Inferior Leads. No ST changes. Fixed Medium-sized, Moderate Severity perfusion defect in mid Inferior-inferoseptal wall c/w prior Infarct (vs. Artifact). No ischemia.  Septal WMA c/w post-CABG.   On my review - when compared to Pre-CABG Myoview the defect is probably consistent with septal perfusion defect from septal wall motion abnormality.  Interval History:   Robert Boyle returns here  today for delayed follow-up doing very well.  He is very happy because he joined the new [facility] [place] he is very happy with the new facility stating that the gym is wonderful.  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  He is very happy with this and is happy with the exercises.  He also happy to report that he has lost about 10 pounds since I last saw him partly because he got back and exercising and has been eating just a whole lot better.    His energy level is better than it has been in 10 years.  He has not  had any chest pain or pressure with rest or exertion.  No heart failure symptoms of PND, orthopnea with maybe some mild end of day edema.  He has not really been noticing any palpitations although it still takes a little bit time for his heart rate to come down with exercise.  CV Review of Symptoms (Summary) Cardiovascular ROS: positive for - midl end of day edema, occasionally notes HR stays fast for a while after more vigourous exercsie; Intentional wgt loss. negative for - chest pain, dyspnea on exertion, edema, irregular heartbeat, orthopnea, palpitations, paroxysmal nocturnal dyspnea, rapid heart rate, shortness of breath, or syncope/near syncope, TIA/amaurosis fugax, claudication  REVIEWED OF SYSTEMS   Review of Systems  Constitutional:  Positive for weight loss (Intentional). Negative for malaise/fatigue.  HENT:  Negative for congestion and nosebleeds.   Respiratory:  Negative for cough and shortness of breath.   Cardiovascular:  Negative for leg swelling.       Per HPI  Gastrointestinal:  Negative for blood in stool and melena.  Genitourinary:  Positive for frequency (nocturia). Negative for hematuria.  Musculoskeletal:  Positive for joint pain (minimal OA pains - better with more exercise). Negative for falls.  Neurological:   Negative for dizziness, focal weakness and weakness.  Psychiatric/Behavioral:  Negative for depression and memory loss. The patient is not nervous/anxious and does not have insomnia.    I have reviewed and (if needed) personally updated the patient's problem list, medications, allergies, past medical and surgical history, social and family history.   PAST MEDICAL HISTORY   Past Medical History:  Diagnosis Date   Acute pulmonary embolus ([place]) 04/13/14   CTA: Bilateral Main PAs; suggestion of increased RV strain & R basal pulmonary infarct,  Mild dilation of the ascending aorta 2.8 cm noted. --> On Xarelto   Arthritis    Arthritis    CAD S/P percutaneous coronary angioplasty 2000, 2001, 03/2014   a. 2000: BMS to D1 75% stenosis (~2.5 mm x 18 mm AVE BMS); b. 2001 ISR of D1 stent - PTCA w/ 2.5 mm Balloon;; c. Myoview 11/'15: Intermediate Risk, inferior-inferolateral ischemia --> Cath: 40% proximal LAD with 90% mid; 90% mid D1 and D2; 100% proximal RCA with left-to-right collaterals for both PL and PDA --> CABG  d. CABG 03/2014: LIMA-LAD, SVG-D1-D2, and SVG sequentially to PDA and PLB.   Essential hypertension    Gastrointestinal complaints    GERD (gastroesophageal reflux disease)    Gout    H/O renal calculi    History of radiation therapy 09/06/13- 11/02/13   prostate 7600 cGy 40 sessions, seminal vesicles 5600 cGy 40 sessions   Hyperlipidemia with target LDL less than 70    Prostate cancer ([place]) 05/23/13   gleason 4+3=7, vloume 34.32 cc; s/p Chemo-XRT   S/P CABG x 5 03/21/2014   LIMA-LAD, seqSVG-D1-D2, seqSVG-PDA-PLB   Shortness of breath dyspnea     PAST SURGICAL HISTORY   Past Surgical History:  Procedure Laterality Date   BACK SURGERY  04/20/1993   lumbar   Carotid & Upper Extr. Ultrasound  10/19/2014   Mild Bilateral fibrous plaque in ICAs, normal Vertebral flow, No SCA stenosis   CHOLECYSTECTOMY  01/22/2010   CORONARY ANGIOPLASTY  07/21/1999   PTCA to ISR stenosis to LAD    CORONARY ANGIOPLASTY WITH STENT PLACEMENT  10/17/1998   PCI with ~2.5 mm x 18 mm AVE BMS to D1   CORONARY ARTERY BYPASS GRAFT N/A 03/21/2014   Procedure: CORONARY ARTERY BYPASS GRAFTING (CABG) TIMES FIVE  USING LEFT INTERNAL MAMMARY TO LAD, SAPHENOUS VEIN GRAFT TO THE PD/PL, SAPHENOUS VEIN GRAFT TO DIAGONAL 1 AND 2 SEQUENTIALLY, SAPHENOUS VEIN HARVESTED ENDOSCOPICALLY;  Surgeon: Grace Isaac, MD;  Location: [facility];  Service: Open Heart Surgery;  Laterality: N/A;   LEFT HEART CATHETERIZATION WITH CORONARY ANGIOGRAM N/A 03/20/2014   Procedure: LEFT HEART CATHETERIZATION WITH CORONARY ANGIOGRAM;  Surgeon: Leonie Man, MD;  Location[facility] C[facility] LAB;  Service: Cardiovascular;  Laterality: N/A;   NM MYOCAR PERF WALL MOTION  11/06/2011   low risk 63% ; FIXED INFEROSEPTAL GUT ATTENUATION   NM MYOVIEW LTD  06/2019   INTERMEDIATE RISK STUDY b/c size of prior infarct. EF 55-60%. TWI noted in Inferior Leads. No ST changes. Fixed Medium-sized, Moderate Severity perfusion defect in mid Inferior-inferoseptal wall c/w prior Infarct (vs. Artifact). No ischemia.  Septal WMA c/w post-CABG. -> on review defect more c/w post CABG Septal WMA related artifact   PROSTATE BIOPSY  05/23/2013   gleason 4+3=7, vol 34.32 cc   TEE WITHOUT CARDIOVERSION N/A 03/21/2014   Procedure: TRANSESOPHAGEAL ECHOCARDIOGRAM (TEE);  Surgeon: Grace Isaac, MD;  Location: [facility];  Service: Open Heart Surgery;  Laterality: N/A;   TRANSTHORACIC ECHOCARDIOGRAM  03/04/2014   EF 55-60%, Gr 1 DD.  Mildly dilated Ascending Aorta; RV now back to normal    Immunization History  Administered Date(s) Administered   Influenza Split 02/01/2016   Influenza, High Dose Seasonal PF 02/04/2017, 02/04/2018, 02/04/2018   Influenza-Unspecified 02/15/2014   PFIZER Comirnaty(Gray Top)Covid-19 Tri-Sucrose Vaccine 09/23/2020   PFIZER(Purple Top)SARS-COV-2 Vaccination 05/25/2019, 06/19/2019   Pneumococcal Conjugate-13 05/02/2014   Pneumococcal  Polysaccharide-23 03/31/2012   Pneumococcal-Unspecified 03/21/2011   Tdap 05/29/2015   Zoster, Live 08/05/2015    MEDICATIONS/ALLERGIES   Current Meds  Medication Sig   allopurinol (ZYLOPRIM) 300 MG tablet Take 300 mg by mouth daily.   aspirin EC 81 MG tablet Take 81 mg Mon-Wed-Fri   celecoxib (CELEBREX) 200 MG capsule Take 200 mg by mouth daily.   esomeprazole (NEXIUM) 10 MG packet    losartan (COZAAR) 100 MG tablet Take 1 tablet (100 mg total) by mouth daily.   metoprolol tartrate (LOPRESSOR) 25 MG tablet Take 0.5 tablets (12.5 mg total) by mouth 2 (two) times daily.   omeprazole (PRILOSEC) 40 MG capsule Take 40 mg by mouth daily.   rosuvastatin (CRESTOR) 20 MG tablet Take 20 mg by mouth daily.   [DISCONTINUED] amLODipine (NORVASC) 10 MG tablet Take 1 tablet (10 mg total) by mouth daily.    No Known Allergies  SOCIAL HISTORY/FAMILY HISTORY   Reviewed in Epic:  Pertinent findings:  Social History   Tobacco Use   Smoking status: Former    Packs/day: 2.00    Years: 25.00    Pack years: 50.00    Types: Cigarettes    Quit date: 04/20/1983    Years since quitting: 37.5   Smokeless tobacco: Never  Substance Use Topics   Alcohol use: Yes    Alcohol/week: 0.0 standard drinks    Comment: 3 a day   Drug use: No   Social History   Social History Narrative   Retired.    Wife - Zigmund Daniel.   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 No tobacco products.   Enjoys playing golf with his friends - if schedule allows.   Does Maintenance Cardiac Rehab    OBJCTIVE -PE, EKG, labs   Wt Readings from Last 3 Encounters:  10/14/20 179 lb 6.4 oz (81.4 kg)  07/07/19 188 lb (85.3  kg)  06/29/19 188 lb 6.4 oz (85.5 kg)    Physical Exam: BP 128/68 (BP Location: Left Arm, Patient Position: Sitting, Cuff Size: Normal)   Pulse 78   Ht 6\' 1"  (1.854 m)   Wt 179 lb 6.4 oz (81.4 kg)   SpO2 96%   BMI 23.67 kg/m  Physical Exam Vitals reviewed.  Constitutional:      General: He is not in acute  distress.    Appearance: Normal appearance. He is normal weight. He is not ill-appearing or toxic-appearing.     Comments: Healthy-appearing.  Well-groomed.  Looks robust & HAPPY  HENT:     Head: Normocephalic and atraumatic.  Neck:     Vascular: No carotid bruit.  Cardiovascular:     Rate and Rhythm: Normal rate and regular rhythm.     Pulses: Normal pulses.     Heart sounds: Normal heart sounds. No murmur heard.   No friction rub. No gallop.  Pulmonary:     Effort: Pulmonary effort is normal. No respiratory distress.     Breath sounds: Normal breath sounds. No wheezing, rhonchi or rales.  Chest:     Chest wall: No tenderness.  Musculoskeletal:        General: No swelling. Normal range of motion.     Cervical back: Normal range of motion and neck supple.  Skin:    General: Skin is warm and dry.  Neurological:     General: No focal deficit present.     Mental Status: He is alert and oriented to person, place, and time.  Psychiatric:        Mood and Affect: Mood normal.        Thought Content: Thought content normal.        Judgment: Judgment normal.    Adult ECG Report  Rate: 78 ;  Rhythm: normal sinus rhythm and 1  AVB, possible septal MI, age-indeterminate, nonspecific ST and T wave changes with mild T wave inversion in lateral leads-stable. ;   Narrative Interpretation: Stable findings.  Recent Labs: He is due for labs to be checked by PCP in roughly 2 weeks.  We will get those results. January Levan 2021: TC 187, TG 112, HDL 116, LDL 52. Cr 0.91 No results found for: CHOL, HDL, LDLCALC, LDLDIRECT, TRIG, CHOLHDL Lab Results  Component Value Date   CREATININE 1.07 08/15/2016   BUN 13 08/15/2016   NA 138 08/15/2016   K 3.6 08/15/2016   CL 106 08/15/2016   CO2 22 08/15/2016   CBC Latest Ref Rng & Units 08/15/2016 04/18/2014 04/17/2014  WBC 4.0 - 10.5 K/uL 5.2 5.5 5.8  Hemoglobin 13.0 - 17.0 g/dL 13.4 9.1(L) 9.3(L)  Hematocrit 39.0 - 52.0 % 40.5 28.8(L) 29.1(L)   Platelets 150 - 400 K/uL 98(L) 229 224    Lab Results  Component Value Date   TSH 4.683 (H) 03/12/2014    ==================================================  COVID-19 Education: The signs and symptoms of COVID-19 were discussed with the patient and how to seek care for testing (follow up with PCP or arrange E-visit).    I spent a total of 17 minutes with the patient spent in direct patient consultation.  Additional time spent with chart review  / charting (studies, outside notes, etc): 14 min Total Time: 31 min  Current medicines are reviewed at length with the patient today.  (+/- concerns) n/a  This visit occurred during the SARS-CoV-2 public health emergency.  Safety protocols were in place, including screening questions prior to  the visit, additional usage of staff PPE, and extensive cleaning of exam room while observing appropriate contact time as indicated for disinfecting solutions.  Notice: This dictation was prepared with Dragon dictation along with smaller phrase technology. Any transcriptional errors that result from this process are unintentional and may not be corrected upon review.  Patient Instructions / Medication Changes & Studies & Tests Ordered   Patient Instructions  Medication Instructions:  No changes *If you need a refill on your cardiac medications before your next appointment, please call your pharmacy*   Lab Work: Not needed If you have labs (blood work) drawn today and your tests are completely normal, you will receive your results only by: MyChart Message (if you have MyChart) OR A paper copy in the mail If you have any lab test that is abnormal or we need to change your treatment, we will call you to review the results.   Testing/Procedures: Not needed   Follow-Up: At [facility], you and your health needs are our priority.  As part of our continuing mission to provide you with exceptional heart care, we have created designated Provider  Care Teams.  These Care Teams include your primary Cardiologist (physician) and Advanced Practice Providers (APPs -  Physician Assistants and Nurse Practitioners) who all work together to provide you with the care you need, when you need it.  We recommend signing up for the patient portal called "MyChart".  Sign up information is provided on this After Visit Summary.  MyChart is used to connect with patients for Virtual Visits (Telemedicine).  Patients are able to view lab/test results, encounter notes, upcoming appointments, etc.  Non-urgent messages can be sent to your provider as well.   To learn more about what you can do with MyChart, go to [email or web address].    Your next appointment:   12 month(s)  The format for your next appointment:   In Person  Provider:   Glenetta Hew, MD    Studies Ordered:   Orders Placed This Encounter  Procedures   EKG 12-Lead     Robert Boyle, M.D., M.S. Interventional Cardiologist   Pager # [phone] Phone # [phone] [address] [place], [place] [address]   Thank you for choosing [facility] at [facility]!!

## 2020-10-14 NOTE — Patient Instructions (Signed)

## 2020-10-20 ENCOUNTER — Encounter: Payer: Self-pay | Admitting: Cardiology

## 2020-10-20 NOTE — Assessment & Plan Note (Signed)
He took quite a long time to recover from his CABG, but once he did he has been doing remarkably well.  He has not had any further anginal symptoms or heart failure symptoms for that matter.  He has Myoview was pretty much unremarkable.  Read as intermediate risk probably because of there being a fixed defect.  If anything this probably looks more like septal wall motion defect from post CABG for he potentially had a small MI which led to his initial presentation.  Regardless, compared to his pre-CABG Myoview, this is relatively unremarkable.  The fact that there was no significant wall motion abnormality would also argue that it was probably artifact.  He is very active, in fact more active now that he was supposed CABG.  Plan:   Continue low-dose beta-blocker for reasons noted -> higher doses led to bradycardia and fatigue  With requirement of lower dose beta-blocker rehab added amlodipine and titrated to 10 mg daily.  We will change prescription to 90 days.  Continue ARB.  Continue statin  Continue aspirin which he is taking 81 mg 3 days a week, this allows him to take his Celebrex daily and not have excessive bleeding or bruising.

## 2020-10-20 NOTE — Assessment & Plan Note (Signed)
Sizable saddle PE following his CABG.  Recovered well.  No signs of residual elevated PAP.  Completed his full year of Xarelto.  Is now on aspirin alone.  He is taking it 3 times a week which allows him to take his Celebrex as well.

## 2020-10-20 NOTE — Assessment & Plan Note (Signed)
Excellent troll on amlodipine 10 mg, and 100 mg losartan along with 12.5 mg twice daily Lopressor.  The reduced dose of blood pressure is really helped his energy level.  Also avoiding bradycardia.

## 2020-10-20 NOTE — Assessment & Plan Note (Signed)
Unfortunate do not have his lipids for this year, LDL was well controlled as of January of last year.  He is due to get labs checked next month.  He has not changed his 20 mg rosuvastatin so we will continue this for now until we see the results.  I suspect with 10 pound weight loss and increased exercise with dietary changes, that it should be stable.

## 2020-10-20 NOTE — Assessment & Plan Note (Signed)
He is now 6-1/2 years out from his CABG.  Surveillance Myoview was unremarkable. He is now thriving, doing better than he had been for years.  Very happy about rejoining the gym [employer] [place].  After his last Myoview, I think we can probably avoid further testing unless he has symptoms.

## 2020-10-20 NOTE — Assessment & Plan Note (Signed)
He definitely felt better with reducing his atorvastatin dse and metoprolol doses.  Thankfully, his lipids have remained stable, and his BP is doing better with amlodipine.  He claims that his energy level is now is good it was that was when he retired from work.

## 2020-11-14 ENCOUNTER — Other Ambulatory Visit: Payer: Self-pay | Admitting: Cardiology

## 2021-02-07 ENCOUNTER — Ambulatory Visit: Payer: Medicare HMO | Attending: Internal Medicine

## 2021-02-07 ENCOUNTER — Other Ambulatory Visit (HOSPITAL_BASED_OUTPATIENT_CLINIC_OR_DEPARTMENT_OTHER): Payer: Self-pay

## 2021-02-07 ENCOUNTER — Other Ambulatory Visit: Payer: Self-pay

## 2021-02-07 DIAGNOSIS — Z23 Encounter for immunization: Secondary | ICD-10-CM

## 2021-02-07 MED ORDER — PFIZER COVID-19 VAC BIVALENT 30 MCG/0.3ML IM SUSP
INTRAMUSCULAR | 0 refills | Status: AC
  Filled 2021-02-07: qty 0.3, 1d supply, fill #0

## 2021-02-07 MED ORDER — FLUAD QUADRIVALENT 0.5 ML IM PRSY
PREFILLED_SYRINGE | INTRAMUSCULAR | 0 refills | Status: AC
  Filled 2021-02-07: qty 0.5, 1d supply, fill #0

## 2021-02-07 NOTE — Progress Notes (Signed)
   Covid-19 Vaccination Clinic  Name:  Robert Boyle    MRN: [id number] DOB: 1935-09-08  02/07/2021  [name] was observed post Covid-19 immunization for 15 minutes without incident. He was provided with Vaccine Information Sheet and instruction to access the V-Safe system.   [name] was instructed to call 911 with any severe reactions post vaccine: Difficulty breathing  Swelling of face and throat  A fast heartbeat  A bad rash all over body  Dizziness and weakness   Immunizations Administered     Name Date Dose VIS Date Route   Pfizer Covid-19 Vaccine Bivalent Booster 02/07/2021  2:35 PM 0.3 mL 12/18/2020 Intramuscular   Manufacturer: [place]   Lot: [id number]   [place]: [phone]

## 2021-03-24 ENCOUNTER — Other Ambulatory Visit: Payer: Self-pay | Admitting: Cardiology

## 2021-11-30 ENCOUNTER — Other Ambulatory Visit: Payer: Self-pay | Admitting: Cardiology

## 2021-12-01 ENCOUNTER — Telehealth: Payer: Self-pay | Admitting: Cardiology

## 2021-12-01 NOTE — Telephone Encounter (Signed)
*  STAT* If patient is at the pharmacy, call can be transferred to refill team.   1. Which medications need to be refilled? (please list name of each medication and dose if known)  amLODipine (NORVASC) 10 MG tablet   2. Which pharmacy/location (including street and city if local pharmacy) is medication to be sent to?[place], [place]  3. Do they need a 30 day or 90 day supply? 90 day

## 2021-12-02 MED ORDER — AMLODIPINE BESYLATE 10 MG PO TABS: 10.0000 mg | ORAL_TABLET | Freq: Every day | ORAL | 0 refills | Status: DC

## 2022-01-07 ENCOUNTER — Ambulatory Visit: Payer: Medicare HMO | Attending: Cardiology | Admitting: Cardiology

## 2022-01-07 ENCOUNTER — Encounter: Payer: Self-pay | Admitting: Cardiology

## 2022-01-07 VITALS — BP 136/70 | HR 64 | Ht 72.0 in | Wt 180.4 lb

## 2022-01-07 DIAGNOSIS — I10 Essential (primary) hypertension: Secondary | ICD-10-CM | POA: Diagnosis not present

## 2022-01-07 DIAGNOSIS — I2510 Atherosclerotic heart disease of native coronary artery without angina pectoris: Secondary | ICD-10-CM

## 2022-01-07 DIAGNOSIS — Z951 Presence of aortocoronary bypass graft: Secondary | ICD-10-CM | POA: Diagnosis not present

## 2022-01-07 DIAGNOSIS — I25119 Atherosclerotic heart disease of native coronary artery with unspecified angina pectoris: Secondary | ICD-10-CM

## 2022-01-07 DIAGNOSIS — Z86711 Personal history of pulmonary embolism: Secondary | ICD-10-CM

## 2022-01-07 DIAGNOSIS — E785 Hyperlipidemia, unspecified: Secondary | ICD-10-CM | POA: Diagnosis not present

## 2022-01-07 DIAGNOSIS — R5383 Other fatigue: Secondary | ICD-10-CM

## 2022-01-07 NOTE — Patient Instructions (Signed)
Medication Instructions:  No changes *If you need a refill on your cardiac medications before your next appointment, please call your pharmacy*   Lab Work: Not needed     Testing/Procedures: Not needed   Follow-Up: At [facility], you and your health needs are our priority.  As part of our continuing mission to provide you with exceptional heart care, we have created designated Provider Care Teams.  These Care Teams include your primary Cardiologist (physician) and Advanced Practice Providers (APPs -  Physician Assistants and Nurse Practitioners) who all work together to provide you with the care you need, when you need it.  We recommend signing up for the patient portal called "MyChart".  Sign up information is provided on this After Visit Summary.  MyChart is used to connect with patients for Virtual Visits (Telemedicine).  Patients are able to view lab/test results, encounter notes, upcoming appointments, etc.  Non-urgent messages can be sent to your provider as well.   To learn more about what you can do with MyChart, go to [email or web address].    Your next appointment:   12 month(s)  The format for your next appointment:   In Person  Provider:   Glenetta Hew, MD       Important Information About Sugar

## 2022-01-07 NOTE — Progress Notes (Signed)
Primary Care Provider: Chesley Noon, MD [place] Cardiologist: Glenetta Hew, MD Electrophysiologist: None  Clinic Note: Chief Complaint  Patient presents with   Follow-up    Delayed annual.  Doing well from a cardiac standpoint.   Coronary Artery Disease    Last IV 05/10/2019 was nonischemic.  No active angina symptoms.    ===================================  ASSESSMENT/PLAN   Problem List Items Addressed This Visit       Cardiology Problems   Atherosclerosis of native coronary artery of native heart without angina pectoris (Chronic)    He does not seem to be having any active angina symptoms.  He is pretty active and doing fairly well.  Energy level seems to be okay.  He seems to maybe minimize things but is apparently doing well.  Plan: Continue low-dose Lopressor 12.5 mg twice daily along with high-dose amlodipine for BP and antianginal benefit. Continue current dose of rosuvastatin.  Labs are close to goal. On aspirin monotherapy. On losartan for afterload reduction.      Relevant Orders   EKG 12-Lead (Completed)   Essential hypertension (Chronic)    BP stable on current meds.  Continue current dose of amlodipine, losartan and metoprolol titrate      Hyperlipidemia with target LDL less than 70 (Chronic)    He is due for lipids to be checked.  Last check was in April, back in the right direction but not down where he was last year.  He is on rosuvastatin 20 mg daily hopefully with some diet and weight loss.  This will help.        Other   Fatigue due to treatment    Seems to be resolved.  He did feel a lot better with reducing atorvastatin dose.  We then switched him to rosuvastatin for better lipid control.  Energy level seems to be good, just probably more "lazy "      History of pulmonary embolism (Chronic)    Pretty significant sizable saddle PE noted shortly after his CABG.  That was probably related to being sedentary and he has not had  any further issues.  No longer on DOAC.  RV seems to have recovered.      S/P CABG x 5: LIMA-LAD, SVG-D1-D2, SVG-RPDA-RPL - Primary (Chronic)    He is coming up on 8 years out from CABG and doing well.  No active angina symptoms really since CABG. Last Myoview was in 2021-nonischemic with large infarct.  Seems to be thriving and doing very well.  Unfortunately though he is very much missing his cardiac rehab.  Hopefully we can get back into the maintenance program at least maybe he can get into a program at his gym.  Would be due for another follow-up stress test in about 2025.  This would put him almost 86 years old.  We can discuss with him whether or not he would want to do a stress test at that time.  Would probably hold off unless he has symptoms.      Relevant Orders   EKG 12-Lead (Completed)   ===================================  HPI:    Robert Boyle is a 86 y.o. male with a PMH below who presents today for delayed annual follow-up at the request of Dr. Melford Aase.Robert Boyle is a long-term friend of Dr. Aldona Bar who is also his cardiologist.  He referred him to me just after Thanksgiving 2015 he was taken to the Cath Lab and found of multivessel disease.  He has been my patient ever since.  CAD-PCI in 2000 and 2001 November-December 2015: Progressive Angina in Abnormal Myoview-MV CAD on Cath--CABGx4 (LIMA-LAD, SVG-D1-D2, and SeqSVG-PDA-PLB Post-OP - Saddle PE (late Dec) -> no longer on DOAC. 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   Now seems to be searching for a place to do his structured exercise.Has joined the [employer], but lacks the structure -> therefore, he has not gotten into a routine yet.  Still walks but not as much Myoview was March 2021: INTERMEDIATE RISK STUDY  b/c size of prior infarct. EF 55-60%. TWI noted in Inferior Leads. No ST changes. Fixed Medium-sized, Moderate Severity perfusion defect in mid Inferior-inferoseptal wall c/w prior Infarct (vs. Artifact). No ischemia.  Septal WMA c/w post-CABG.   On my review - when compared to Pre-CABG Myoview the defect is probably consistent with septal perfusion defect from septal wall motion abnormality. Essential hypertension Hyperlipidemia  Robert Boyle was last seen on October 14, 2020 again as a delayed follow-up.  He is doing well.  He had just joined the canal facility at [facility] and was hoping to get started in the gym.  He had lost about 10 pounds since his previous visit and was eating a whole lot better.  His energy is as good as it been for the last 10 years.  No chest pain or pressure at exertion.  No heart failure symptoms of PND, orthopnea or edema.  No palpitations or irregular heartbeats etc.  No syncope or near syncope.  Recent Hospitalizations: None  Reviewed  CV studies:    The following studies were reviewed today: (if available, images/films reviewed: From Epic Chart or Care Everywhere) None:  Interval History:   Robert Boyle returns here today for delayed annual follow-up overall doing fine for chronic standpoint.  Currently asymptomatic.  He still walks just about every day, but is not really doing the exercise that he would like to do.  He really misses cardiac rehab with the structured exercise.  He does not go to the gym at the [place] facility because his wife who usually would go with him is not showing interest in going.  Despite this, his wife is giving him grief about not being active enough.  He says his wife is driving him crazy trying to push him into doing more activity.  He does not necessarily feel like he is not exercising that much he is walking plenty and his weight is doing fine.  He just does not eat as much as usual so is lost some weight.  1 device that  he mentions that he has about 2-4 scotches a day most the time is 2 but sometimes will have more night.  This has been a longstanding thing for him.  We have not really talked about it before but we did go into detail today he is try to cut down the scotch but does not want to give it up.  He has gone weeks at a time without it, without issues.  CV Review of Symptoms (Summary):: no chest pain or dyspnea on exertion positive for - mild exercise intolerance, mostly because he has not been doing as much.  He is lost some more weight. negative for - edema, orthopnea, palpitations, paroxysmal nocturnal dyspnea, rapid heart rate, shortness of breath, or syncope  or near syncope, TIA/amaurosis fugax or claudication.  REVIEWED OF SYSTEMS   Review of Systems  Constitutional:  Negative for malaise/fatigue (Feels like he should be doing more exercise.) and weight loss (Just not gaining weight.  His wife thinks he is not eating enough.).  HENT:  Negative for congestion and nosebleeds.   Respiratory:  Negative for shortness of breath (Not really).   Cardiovascular:        Per HPI  Gastrointestinal:  Negative for blood in stool, constipation and melena.  Genitourinary:  Negative for hematuria.  Musculoskeletal:  Positive for back pain and joint pain. Negative for falls.  Neurological:  Negative for dizziness and focal weakness.  Psychiatric/Behavioral: Negative.    All other systems reviewed and are negative.   I have reviewed and (if needed) personally updated the patient's problem list, medications, allergies, past medical and surgical history, social and family history.   PAST MEDICAL HISTORY   Past Medical History:  Diagnosis Date   Acute pulmonary embolus ([place]) 04/13/14   CTA: Bilateral Main PAs; suggestion of increased RV strain & R basal pulmonary infarct,  Mild dilation of the ascending aorta 2.8 cm noted. --> On Xarelto   Arthritis    Arthritis    CAD S/P percutaneous coronary angioplasty  2000, 2001, 03/2014   a. 2000: BMS to D1 75% stenosis (~2.5 mm x 18 mm AVE BMS); b. 2001 ISR of D1 stent - PTCA w/ 2.5 mm Balloon;; c. Myoview 11/'15: Intermediate Risk, inferior-inferolateral ischemia --> Cath: 40% proximal LAD with 90% mid; 90% mid D1 and D2; 100% proximal RCA with left-to-right collaterals for both PL and PDA --> CABG  d. CABG 03/2014: LIMA-LAD, SVG-D1-D2, and SVG sequentially to PDA and PLB.   Essential hypertension    Gastrointestinal complaints    GERD (gastroesophageal reflux disease)    Gout    H/O renal calculi    History of radiation therapy 09/06/13- 11/02/13   prostate 7600 cGy 40 sessions, seminal vesicles 5600 cGy 40 sessions   Hyperlipidemia with target LDL less than 70    Prostate cancer ([place]) 05/23/13   gleason 4+3=7, vloume 34.32 cc; s/p Chemo-XRT   S/P CABG x 5 03/21/2014   LIMA-LAD, seqSVG-D1-D2, seqSVG-PDA-PLB   Shortness of breath dyspnea     PAST SURGICAL HISTORY   Past Surgical History:  Procedure Laterality Date   BACK SURGERY  04/20/1993   lumbar   Carotid & Upper Extr. Ultrasound  10/19/2014   Mild Bilateral fibrous plaque in ICAs, normal Vertebral flow, No SCA stenosis   CHOLECYSTECTOMY  01/22/2010   CORONARY ANGIOPLASTY  07/21/1999   PTCA to ISR stenosis to LAD   CORONARY ANGIOPLASTY WITH STENT PLACEMENT  10/17/1998   PCI with ~2.5 mm x 18 mm AVE BMS to D1   CORONARY ARTERY BYPASS GRAFT N/A 03/21/2014   Procedure: CORONARY ARTERY BYPASS GRAFTING (CABG) TIMES FIVE USING LEFT INTERNAL MAMMARY TO LAD, SAPHENOUS VEIN GRAFT TO THE PD/PL, SAPHENOUS VEIN GRAFT TO DIAGONAL 1 AND 2 SEQUENTIALLY, SAPHENOUS VEIN HARVESTED ENDOSCOPICALLY;  Surgeon: Grace Isaac, MD;  Location: [place] [facility];  Service: Open Heart Surgery;  Laterality: N/A;   LEFT HEART CATHETERIZATION WITH CORONARY ANGIOGRAM N/A 03/20/2014   Procedure: LEFT HEART CATHETERIZATION WITH CORONARY ANGIOGRAM;  Surgeon: Leonie Man, MD;  Location[facility] C[facility] LAB;  Service: Cardiovascular;   Laterality: N/A;   NM MYOCAR PERF WALL MOTION  11/06/2011   low risk 63% ; FIXED INFEROSEPTAL GUT ATTENUATION   NM MYOVIEW LTD  06/2019  INTERMEDIATE RISK STUDY b/c size of prior infarct. EF 55-60%. TWI noted in Inferior Leads. No ST changes. Fixed Medium-sized, Moderate Severity perfusion defect in mid Inferior-inferoseptal wall c/w prior Infarct (vs. Artifact). No ischemia.  Septal WMA c/w post-CABG. -> on review defect more c/w post CABG Septal WMA related artifact   PROSTATE BIOPSY  05/23/2013   gleason 4+3=7, vol 34.32 cc   TEE WITHOUT CARDIOVERSION N/A 03/21/2014   Procedure: TRANSESOPHAGEAL ECHOCARDIOGRAM (TEE);  Surgeon: Grace Isaac, MD;  Location: [place];  Service: Open Heart Surgery;  Laterality: N/A;   TRANSTHORACIC ECHOCARDIOGRAM  03/04/2014   EF 55-60%, Gr 1 DD.  Mildly dilated Ascending Aorta; RV now back to normal    Immunization History  Administered Date(s) Administered   Influenza Split 02/01/2016   Influenza, High Dose Seasonal PF 02/04/2017, 02/04/2018, 02/04/2018   Influenza-Unspecified 02/15/2014   PFIZER Comirnaty(Gray Top)Covid-19 Tri-Sucrose Vaccine 09/23/2020   PFIZER(Purple Top)SARS-COV-2 Vaccination 05/25/2019, 06/19/2019   Pfizer Covid-19 Vaccine Bivalent Booster 38yr & up 02/07/2021   Pneumococcal Conjugate-13 05/02/2014   Pneumococcal Polysaccharide-23 03/31/2012   Pneumococcal-Unspecified 03/21/2011   Tdap 05/29/2015   Zoster, Live 08/05/2015    MEDICATIONS/ALLERGIES   Current Meds  Medication Sig   allopurinol (ZYLOPRIM) 300 MG tablet Take 300 mg by mouth daily.   amLODipine (NORVASC) 10 MG tablet Take 1 tablet (10 mg total) by mouth daily.   aspirin EC 81 MG tablet Take 81 mg Mon-Wed-Fri   celecoxib (CELEBREX) 200 MG capsule Take 200 mg by mouth daily.   COVID-19 mRNA bivalent vaccine, Pfizer, (PFIZER COVID-19 VAC BIVALENT) injection Inject into the muscle.   esomeprazole (NEXIUM) 10 MG packet    influenza vaccine adjuvanted (FLUAD  QUADRIVALENT) 0.5 ML injection Inject into the muscle.   losartan (COZAAR) 100 MG tablet TAKE 1 TABLET DAILY   metoprolol tartrate (LOPRESSOR) 25 MG tablet TAKE 1/2 TABLET TWICE A DAY   omeprazole (PRILOSEC) 40 MG capsule Take 40 mg by mouth daily.   rosuvastatin (CRESTOR) 20 MG tablet Take 20 mg by mouth daily.   No Known Allergies  SOCIAL HISTORY/FAMILY HISTORY   Reviewed in Epic:  Pertinent findings:  Social History   Tobacco Use   Smoking status: Former    Packs/day: 2.00    Years: 25.00    Total pack years: 50.00    Types: Cigarettes    Quit date: 04/20/1983    Years since quitting: 38.7   Smokeless tobacco: Never  Substance Use Topics   Alcohol use: Yes    Alcohol/week: 0.0 standard drinks of alcohol    Comment: 3 a day   Drug use: No   Social History   Social History Narrative   Retired.    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 No tobacco products.   Enjoys playing golf with his friends - if schedule allows.   Does Maintenance Cardiac Rehab    OBJCTIVE -PE, EKG, labs   Wt Readings from Last 3 Encounters:  01/07/22 180 lb 6.4 oz (81.8 kg)  10/14/20 179 lb 6.4 oz (81.4 kg)  07/07/19 188 lb (85.3 kg)    Physical Exam: BP 136/70   Pulse 64   Ht 6' (1.829 m)   Wt 180 lb 6.4 oz (81.8 kg)   SpO2 96%   BMI 24.47 kg/m  Physical Exam Vitals reviewed.  Constitutional:      General: He is not in acute distress.    Appearance: Normal appearance. He is normal weight. He is not  ill-appearing or toxic-appearing.     Comments: Very healthy-appearing.  Appears younger than stated age.  HENT:     Head: Normocephalic and atraumatic.  Neck:     Vascular: No carotid bruit, hepatojugular reflux or JVD.  Cardiovascular:     Rate and Rhythm: Normal rate and regular rhythm. No extrasystoles are present.    Chest Wall: PMI is not displaced.     Pulses: Normal pulses.     Heart sounds: S1 normal and S2 normal. No murmur heard.    No friction rub. No gallop.   Pulmonary:     Effort: Pulmonary effort is normal. No respiratory distress.     Breath sounds: Normal breath sounds. No wheezing, rhonchi or rales.  Chest:     Chest wall: No tenderness.  Musculoskeletal:        General: No swelling. Normal range of motion.     Cervical back: Normal range of motion and neck supple.  Skin:    General: Skin is warm and dry.     Coloration: Skin is not jaundiced.  Neurological:     General: No focal deficit present.     Mental Status: He is alert and oriented to person, place, and time. Mental status is at baseline.     Motor: No weakness.     Gait: Gait normal.  Psychiatric:        Mood and Affect: Mood normal.        Behavior: Behavior normal.        Thought Content: Thought content normal.        Judgment: Judgment normal.     Adult ECG Report  Rate: 64;  Rhythm: normal sinus rhythm and 1  AVB ; otherwise normal axis, intervals and durations.  Narrative Interpretation: Stable  Recent Labs:    [place] Related to Lipid panel Component 07/22/21  04/23/21  10/23/20  04/25/20  11/21/19  11/21/19   Cholesterol, Total 187 204 High  187 190 178 --  Triglycerides 85 86 72 77 84 --  HDL 98 108 110 107 99 --  VLDL Cholesterol Cal '15 15 13 14 15 '$ --  LDL 74 81 64       Lab Results  Component Value Date   CREATININE 1.07 08/15/2016   BUN 13 08/15/2016   NA 138 08/15/2016   K 3.6 08/15/2016   CL 106 08/15/2016   CO2 22 08/15/2016      Latest Ref Rng & Units 08/15/2016    4:09 PM 04/18/2014    5:00 AM 04/17/2014    6:05 AM  CBC  WBC 4.0 - 10.5 K/uL 5.2  5.5  5.8   Hemoglobin 13.0 - 17.0 g/dL 13.4  9.1  9.3   Hematocrit 39.0 - 52.0 % 40.5  28.8  29.1   Platelets 150 - 400 K/uL 98  229  224     Lab Results  Component Value Date   HGBA1C 5.6 03/21/2014   Lab Results  Component Value Date   TSH 4.683 (H) 03/12/2014    ================================================== I spent a total of 21 minutes with the patient spent in  direct patient consultation.  Additional time spent with chart review  / charting (studies, outside notes, etc): 16 min Total Time: 37 min  Current medicines are reviewed at length with the patient today.  (+/- concerns) none  Notice: This dictation was prepared with Dragon dictation along with smart phrase technology. Any transcriptional errors that result from this process are unintentional  and may not be corrected upon review.  Studies Ordered:   Orders Placed This Encounter  Procedures   EKG 12-Lead   No orders of the defined types were placed in this encounter.   Patient Instructions / Medication Changes & Studies & Tests Ordered   Patient Instructions  Medication Instructions:  No changes *If you need a refill on your cardiac medications before your next appointment, please call your pharmacy*   Lab Work: Not needed     Testing/Procedures: Not needed   Follow-Up: At [facility], you and your health needs are our priority.  As part of our continuing mission to provide you with exceptional heart care, we have created designated Provider Care Teams.  These Care Teams include your primary Cardiologist (physician) and Advanced Practice Providers (APPs -  Physician Assistants and Nurse Practitioners) who all work together to provide you with the care you need, when you need it.  We recommend signing up for the patient portal called "MyChart".  Sign up information is provided on this After Visit Summary.  MyChart is used to connect with patients for Virtual Visits (Telemedicine).  Patients are able to view lab/test results, encounter notes, upcoming appointments, etc.  Non-urgent messages can be sent to your provider as well.   To learn more about what you can do with MyChart, go to [email or web address].    Your next appointment:   12 month(s)  The format for your next appointment:   In Person  Provider:   Glenetta Hew, MD       Important Information  About [place]          Leonie Man, MD, MS Glenetta Hew, M.D., M.S. Interventional Cardiologist  [place]  Pager # [phone] Phone # [phone] [address]. [place], [place] [address]   Thank you for choosing [facility] [place]!!

## 2022-01-18 ENCOUNTER — Encounter: Payer: Self-pay | Admitting: Cardiology

## 2022-01-18 NOTE — Assessment & Plan Note (Signed)
Seems to be resolved.  He did feel a lot better with reducing atorvastatin dose.  We then switched him to rosuvastatin for better lipid control.  Energy level seems to be good, just probably more "lazy "

## 2022-01-18 NOTE — Assessment & Plan Note (Signed)
BP stable on current meds.  Continue current dose of amlodipine, losartan and metoprolol titrate

## 2022-01-18 NOTE — Assessment & Plan Note (Signed)
He does not seem to be having any active angina symptoms.  He is pretty active and doing fairly well.  Energy level seems to be okay.  He seems to maybe minimize things but is apparently doing well.  Plan:  Continue low-dose Lopressor 12.5 mg twice daily along with high-dose amlodipine for BP and antianginal benefit.  Continue current dose of rosuvastatin.  Labs are close to goal.  On aspirin monotherapy.  On losartan for afterload reduction.

## 2022-01-18 NOTE — Assessment & Plan Note (Signed)
He is coming up on 8 years out from CABG and doing well.  No active angina symptoms really since CABG. Last Myoview was in 2021-nonischemic with large infarct.  Seems to be thriving and doing very well.  Unfortunately though he is very much missing his cardiac rehab.  Hopefully we can get back into the maintenance program at least maybe he can get into a program at his gym.  Would be due for another follow-up stress test in about 2025.  This would put him almost 86 years old.  We can discuss with him whether or not he would want to do a stress test at that time.  Would probably hold off unless he has symptoms.

## 2022-01-18 NOTE — Assessment & Plan Note (Signed)
He is due for lipids to be checked.  Last check was in April, back in the right direction but not down where he was last year.  He is on rosuvastatin 20 mg daily hopefully with some diet and weight loss.  This will help.

## 2022-01-18 NOTE — Assessment & Plan Note (Signed)
Pretty significant sizable saddle PE noted shortly after his CABG.  That was probably related to being sedentary and he has not had any further issues.  No longer on DOAC.  RV seems to have recovered.

## 2022-03-23 ENCOUNTER — Other Ambulatory Visit: Payer: Self-pay | Admitting: Cardiology

## 2022-04-29 ENCOUNTER — Telehealth: Payer: Self-pay | Admitting: Cardiology

## 2022-04-29 MED ORDER — LOSARTAN POTASSIUM 100 MG PO TABS: 100.0000 mg | ORAL_TABLET | Freq: Every day | ORAL | 3 refills | Status: DC

## 2022-04-29 NOTE — Telephone Encounter (Signed)
 *  STAT* If patient is at the pharmacy, call can be transferred to refill team.   1. Which medications need to be refilled? (please list name of each medication and dose if known) losartan (COZAAR) 100 MG tablet   2. Which pharmacy/location (including street and city if local pharmacy) is medication to be sent to? [facility] [place], [place] to Registered [facility]   3. Do they need a 30 day or 90 day supply? 90 days

## 2022-08-22 ENCOUNTER — Other Ambulatory Visit: Payer: Self-pay | Admitting: Cardiology

## 2023-01-04 NOTE — Progress Notes (Deleted)
Cardiology Clinic Note   Patient Name: Robert Boyle Date of Encounter: 01/04/2023  Primary Care Provider:  Eartha Inch, MD Primary Cardiologist:  Bryan Lemma, MD  Patient Profile    87 year old male with history of coronary artery disease status post CABG x 3 with LIMA to LAD, SVG to diagonal 1 diagonal 2, SVG to RPDA-R PL and 2016, last Myoview in 2021 was nonischemic with a large infarct, hyperlipidemia, chronic hypertension, history of pulmonary embolism (saddle PE noted shortly after CABG).  No longer on DOAC.  Last seen by Dr. Herbie Baltimore on 01/07/2022.  He was not very active although he states he was walking daily.  He is a daily scotch drinker.  No changes, testing, or plan on last office visit.  Past Medical History    Past Medical History:  Diagnosis Date   Acute pulmonary embolus (HCC) 04/13/14   CTA: Bilateral Main PAs; suggestion of increased RV strain & R basal pulmonary infarct,  Mild dilation of the ascending aorta 2.8 cm noted. --> On Xarelto   Arthritis    Arthritis    CAD S/P percutaneous coronary angioplasty 2000, 2001, 03/2014   a. 2000: BMS to D1 75% stenosis (~2.5 mm x 18 mm AVE BMS); b. 2001 ISR of D1 stent - PTCA w/ 2.5 mm Balloon;; c. Myoview 11/'15: Intermediate Risk, inferior-inferolateral ischemia --> Cath: 40% proximal LAD with 90% mid; 90% mid D1 and D2; 100% proximal RCA with left-to-right collaterals for both PL and PDA --> CABG  d. CABG 03/2014: LIMA-LAD, SVG-D1-D2, and SVG sequentially to PDA and PLB.   Essential hypertension    Gastrointestinal complaints    GERD (gastroesophageal reflux disease)    Gout    H/O renal calculi    History of radiation therapy 09/06/13- 11/02/13   prostate 7600 cGy 40 sessions, seminal vesicles 5600 cGy 40 sessions   Hyperlipidemia with target LDL less than 70    Prostate cancer (HCC) 05/23/13   gleason 4+3=7, vloume 34.32 cc; s/p Chemo-XRT   S/P CABG x 5 03/21/2014   LIMA-LAD, seqSVG-D1-D2, seqSVG-PDA-PLB    Shortness of breath dyspnea    Past Surgical History:  Procedure Laterality Date   BACK SURGERY  04/20/1993   lumbar   Carotid & Upper Extr. Ultrasound  10/19/2014   Mild Bilateral fibrous plaque in ICAs, normal Vertebral flow, No SCA stenosis   CHOLECYSTECTOMY  01/22/2010   CORONARY ANGIOPLASTY  07/21/1999   PTCA to ISR stenosis to LAD   CORONARY ANGIOPLASTY WITH STENT PLACEMENT  10/17/1998   PCI with ~2.5 mm x 18 mm AVE BMS to D1   CORONARY ARTERY BYPASS GRAFT N/A 03/21/2014   Procedure: CORONARY ARTERY BYPASS GRAFTING (CABG) TIMES FIVE USING LEFT INTERNAL MAMMARY TO LAD, SAPHENOUS VEIN GRAFT TO THE PD/PL, SAPHENOUS VEIN GRAFT TO DIAGONAL 1 AND 2 SEQUENTIALLY, SAPHENOUS VEIN HARVESTED ENDOSCOPICALLY;  Surgeon: Delight Ovens, MD;  Location: [facility] OR;  Service: Open Heart Surgery;  Laterality: N/A;   LEFT HEART CATHETERIZATION WITH CORONARY ANGIOGRAM N/A 03/20/2014   Procedure: LEFT HEART CATHETERIZATION WITH CORONARY ANGIOGRAM;  Surgeon: Marykay Lex, MD;  Location: [facility] CATH LAB;  Service: Cardiovascular;  Laterality: N/A;   NM MYOCAR PERF WALL MOTION  11/06/2011   low risk 63% ; FIXED INFEROSEPTAL GUT ATTENUATION   NM MYOVIEW LTD  06/2019   INTERMEDIATE RISK STUDY b/c size of prior infarct. EF 55-60%. TWI noted in Inferior Leads. No ST changes. Fixed Medium-sized, Moderate Severity perfusion defect in mid Inferior-inferoseptal wall c/w  prior Infarct (vs. Artifact). No ischemia.  Septal WMA c/w post-CABG. -> on review defect more c/w post CABG Septal WMA related artifact   PROSTATE BIOPSY  05/23/2013   gleason 4+3=7, vol 34.32 cc   TEE WITHOUT CARDIOVERSION N/A 03/21/2014   Procedure: TRANSESOPHAGEAL ECHOCARDIOGRAM (TEE);  Surgeon: Delight Ovens, MD;  Location: [facility] OR;  Service: Open Heart Surgery;  Laterality: N/A;   TRANSTHORACIC ECHOCARDIOGRAM  03/04/2014   EF 55-60%, Gr 1 DD.  Mildly dilated Ascending Aorta; RV now back to normal    Allergies  No Known Allergies  History  of Present Illness    ***  Home Medications    Current Outpatient Medications  Medication Sig Dispense Refill   allopurinol (ZYLOPRIM) 300 MG tablet Take 300 mg by mouth daily.     amLODipine (NORVASC) 10 MG tablet TAKE 1 TABLET DAILY 90 tablet 1   aspirin EC 81 MG tablet Take 81 mg Mon-Wed-Fri 30 tablet 6   celecoxib (CELEBREX) 200 MG capsule Take 200 mg by mouth daily.     COVID-19 mRNA bivalent vaccine, Pfizer, (PFIZER COVID-19 VAC BIVALENT) injection Inject into the muscle. 0.3 mL 0   esomeprazole (NEXIUM) 10 MG packet      influenza vaccine adjuvanted (FLUAD QUADRIVALENT) 0.5 ML injection Inject into the muscle. 0.5 mL 0   losartan (COZAAR) 100 MG tablet Take 1 tablet (100 mg total) by mouth daily. 90 tablet 3   metoprolol tartrate (LOPRESSOR) 25 MG tablet TAKE 1/2 TABLET TWICE A DAY 90 tablet 3   omeprazole (PRILOSEC) 40 MG capsule Take 40 mg by mouth daily.     rosuvastatin (CRESTOR) 20 MG tablet Take 20 mg by mouth daily.     No current facility-administered medications for this visit.     Family History    Family History  Problem Relation Age of Onset   Heart attack Father    Stroke Mother    Heart attack Brother    Cancer Brother        one brother-prostate   He indicated that his mother is deceased. He indicated that his father is deceased. He indicated that his sister is deceased. He indicated that only one of his two brothers is alive.  Social History    Social History   Socioeconomic History   Marital status: Single    Spouse name: Not on file   Number of children: Not on file   Years of education: Not on file   Highest education level: Not on file  Occupational History   Not on file  Tobacco Use   Smoking status: Former    Current packs/day: 0.00    Average packs/day: 2.0 packs/day for 25.0 years (50.0 ttl pk-yrs)    Types: Cigarettes    Start date: 04/19/1958    Quit date: 04/20/1983    Years since quitting: 39.7   Smokeless tobacco: Never   Substance and Sexual Activity   Alcohol use: Yes    Alcohol/week: 0.0 standard drinks of alcohol    Comment: 3 a day   Drug use: No   Sexual activity: Not on file  Other Topics Concern   Not on file  Social History Narrative   Retired.    Wife - Joyce Gross.   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 No tobacco products.   Enjoys playing golf with his friends - if schedule allows.   Does Maintenance Cardiac Rehab   Social Determinants of Health   Financial Resource Strain: Low Risk  (07/20/2022)  Received from [facility]   Overall Financial Resource Strain (CARDIA)    Difficulty of Paying Living Expenses: Not hard at all  Food Insecurity: No Food Insecurity (08/05/2022)   Received from [facility]   Hunger Vital Sign    Worried About Running Out of Food in the Last Year: Never true    Ran Out of Food in the Last Year: Never true  Transportation Needs: No Transportation Needs (08/05/2022)   Received from [facility] - Transportation    Lack of Transportation (Medical): No    Lack of Transportation (Non-Medical): No  Physical Activity: Unknown (01/21/2022)   Received from [facility]   Exercise Vital Sign    Days of Exercise per Week: 0 days    Minutes of Exercise per Session: Not on file  Recent Concern: Physical Activity - Inactive (01/21/2022)   Received from [facility]   Exercise Vital Sign    Days of Exercise per Week: 0 days    Minutes of Exercise per Session: 0 min  Stress: No Stress Concern Present (01/21/2022)   Received from [facility] - Occupational Stress Questionnaire    Feeling of Stress : Not at all  Social Connections: Socially Integrated (01/21/2022)   Received from [facility]   Social Network    How would you rate your social network (family, work, friends)?: Good participation with social networks  Intimate Partner Violence: Not At Risk (01/21/2022)   Received from [facility]   HITS    Over the  last 12 months how often did your partner physically hurt you?: 1    Over the last 12 months how often did your partner insult you or talk down to you?: 1    Over the last 12 months how often did your partner threaten you with physical harm?: 1    Over the last 12 months how often did your partner scream or curse at you?: 1     Review of Systems    General:  No chills, fever, night sweats or weight changes.  Cardiovascular:  No chest pain, dyspnea on exertion, edema, orthopnea, palpitations, paroxysmal nocturnal dyspnea. Dermatological: No rash, lesions/masses Respiratory: No cough, dyspnea Urologic: No hematuria, dysuria Abdominal:   No nausea, vomiting, diarrhea, bright red blood per rectum, melena, or hematemesis Neurologic:  No visual changes, wkns, changes in mental status. All other systems reviewed and are otherwise negative except as noted above.       Physical Exam    VS:  There were no vitals taken for this visit. , BMI There is no height or weight on file to calculate BMI.     GEN: Well nourished, well developed, in no acute distress. HEENT: normal. Neck: Supple, no JVD, carotid bruits, or masses. Cardiac: RRR, no murmurs, rubs, or gallops. No clubbing, cyanosis, edema.  Radials/DP/PT 2+ and equal bilaterally.  Respiratory:  Respirations regular and unlabored, clear to auscultation bilaterally. GI: Soft, nontender, nondistended, BS + x 4. MS: no deformity or atrophy. Skin: warm and dry, no rash. Neuro:  Strength and sensation are intact. Psych: Normal affect.      Lab Results  Component Value Date   WBC 5.2 08/15/2016   HGB 13.4 08/15/2016   HCT 40.5 08/15/2016   MCV 91.4 08/15/2016   PLT 98 (L) 08/15/2016   Lab Results  Component Value Date   CREATININE 1.07 08/15/2016   BUN 13 08/15/2016   NA 138 08/15/2016  K 3.6 08/15/2016   CL 106 08/15/2016   CO2 22 08/15/2016   Lab Results  Component Value Date   ALT 43 08/15/2016   AST 47 (H) 08/15/2016    ALKPHOS 67 08/15/2016   BILITOT 1.2 08/15/2016   No results found for: "CHOL", "HDL", "LDLCALC", "LDLDIRECT", "TRIG", "CHOLHDL"  Lab Results  Component Value Date   HGBA1C 5.6 03/21/2014     Review of Prior Studies      Assessment & Plan   1.  ***     {Are you ordering a CV Procedure (e.g. stress test, cath, DCCV, TEE, etc)?   Press F2        :[id number]}   Signed, Bettey Mare. Liborio Nixon, ANP, AACC   01/04/2023 11:55 AM      Office [phone] Fax [phone]  Notice: This dictation was prepared with Dragon dictation along with smaller phrase technology. Any transcriptional errors that result from this process are unintentional and may not be corrected upon review.

## 2023-01-07 ENCOUNTER — Ambulatory Visit: Payer: Medicare HMO | Admitting: Adult Health

## 2023-01-09 NOTE — Progress Notes (Unsigned)
Cardiology Clinic Note   Patient Name: Robert Boyle Date of Encounter: 01/11/2023  Primary Care Provider:  Eartha Inch, MD Primary Cardiologist:  Bryan Lemma, MD  Patient Profile    87 year old male with history of coronary artery disease status post CABG x 5 in 2016 (LIMA to LAD, SVG to diagonal 1-diagonal 2, SVG to RPDA-RPL), bare-metal stent to diagonal 1 in 2000, o medical management.  Other hx of  hypertension, hyperlipidemia, history of saddle pulmonary embolism, last seen by Dr. Herbie Baltimore on 01/07/2022.  He is due for follow-up stress test in 2025.    Past Medical History    Past Medical History:  Diagnosis Date   Acute pulmonary embolus (HCC) 04/13/14   CTA: Bilateral Main PAs; suggestion of increased RV strain & R basal pulmonary infarct,  Mild dilation of the ascending aorta 2.8 cm noted. --> On Xarelto   Arthritis    Arthritis    CAD S/P percutaneous coronary angioplasty 2000, 2001, 03/2014   a. 2000: BMS to D1 75% stenosis (~2.5 mm x 18 mm AVE BMS); b. 2001 ISR of D1 stent - PTCA w/ 2.5 mm Balloon;; c. Myoview 11/'15: Intermediate Risk, inferior-inferolateral ischemia --> Cath: 40% proximal LAD with 90% mid; 90% mid D1 and D2; 100% proximal RCA with left-to-right collaterals for both PL and PDA --> CABG  d. CABG 03/2014: LIMA-LAD, SVG-D1-D2, and SVG sequentially to PDA and PLB.   Essential hypertension    Gastrointestinal complaints    GERD (gastroesophageal reflux disease)    Gout    H/O renal calculi    History of radiation therapy 09/06/13- 11/02/13   prostate 7600 cGy 40 sessions, seminal vesicles 5600 cGy 40 sessions   Hyperlipidemia with target LDL less than 70    Prostate cancer (HCC) 05/23/13   gleason 4+3=7, vloume 34.32 cc; s/p Chemo-XRT   S/P CABG x 5 03/21/2014   LIMA-LAD, seqSVG-D1-D2, seqSVG-PDA-PLB   Shortness of breath dyspnea    Past Surgical History:  Procedure Laterality Date   BACK SURGERY  04/20/1993   lumbar   Carotid & Upper Extr.  Ultrasound  10/19/2014   Mild Bilateral fibrous plaque in ICAs, normal Vertebral flow, No SCA stenosis   CHOLECYSTECTOMY  01/22/2010   CORONARY ANGIOPLASTY  07/21/1999   PTCA to ISR stenosis to LAD   CORONARY ANGIOPLASTY WITH STENT PLACEMENT  10/17/1998   PCI with ~2.5 mm x 18 mm AVE BMS to D1   CORONARY ARTERY BYPASS GRAFT N/A 03/21/2014   Procedure: CORONARY ARTERY BYPASS GRAFTING (CABG) TIMES FIVE USING LEFT INTERNAL MAMMARY TO LAD, SAPHENOUS VEIN GRAFT TO THE PD/PL, SAPHENOUS VEIN GRAFT TO DIAGONAL 1 AND 2 SEQUENTIALLY, SAPHENOUS VEIN HARVESTED ENDOSCOPICALLY;  Surgeon: Delight Ovens, MD;  Location: [facility] OR;  Service: Open Heart Surgery;  Laterality: N/A;   LEFT HEART CATHETERIZATION WITH CORONARY ANGIOGRAM N/A 03/20/2014   Procedure: LEFT HEART CATHETERIZATION WITH CORONARY ANGIOGRAM;  Surgeon: Marykay Lex, MD;  Location: [facility] CATH LAB;  Service: Cardiovascular;  Laterality: N/A;   NM MYOCAR PERF WALL MOTION  11/06/2011   low risk 63% ; FIXED INFEROSEPTAL GUT ATTENUATION   NM MYOVIEW LTD  06/2019   INTERMEDIATE RISK STUDY b/c size of prior infarct. EF 55-60%. TWI noted in Inferior Leads. No ST changes. Fixed Medium-sized, Moderate Severity perfusion defect in mid Inferior-inferoseptal wall c/w prior Infarct (vs. Artifact). No ischemia.  Septal WMA c/w post-CABG. -> on review defect more c/w post CABG Septal WMA related artifact   PROSTATE BIOPSY  05/23/2013   gleason 4+3=7, vol 34.32 cc   TEE WITHOUT CARDIOVERSION N/A 03/21/2014   Procedure: TRANSESOPHAGEAL ECHOCARDIOGRAM (TEE);  Surgeon: Delight Ovens, MD;  Location: [facility] OR;  Service: Open Heart Surgery;  Laterality: N/A;   TRANSTHORACIC ECHOCARDIOGRAM  03/04/2014   EF 55-60%, Gr 1 DD.  Mildly dilated Ascending Aorta; RV now back to normal    Allergies  No Known Allergies  History of Present Illness    Robert Boyle returns today for delayed annual follow-up for CAD with history of CABG, hypertension, hyperlipidemia, and  history of saddle pulmonary emboli.  He comes today feeling very well.  He denies any recurrent chest pain, palpitations, or dyspnea on exertion.  He states he does not exercise as well as he used to.  He was a part of cardiac rehab but this was stopped due to pandemic and he was never restarted.  He would like to get back to purposeful exercise and rejoin cardiac rehab again.  He states that he has called several times but has not been able to be resigned up.  His only complaint is sometimes a dizziness in the morning after he takes melatonin the night before to help him sleep.  Home Medications    Current Outpatient Medications  Medication Sig Dispense Refill   allopurinol (ZYLOPRIM) 300 MG tablet Take 300 mg by mouth daily.     amLODipine (NORVASC) 10 MG tablet TAKE 1 TABLET DAILY 90 tablet 1   aspirin EC 81 MG tablet Take 81 mg Mon-Wed-Fri 30 tablet 6   celecoxib (CELEBREX) 200 MG capsule Take 200 mg by mouth daily.     esomeprazole (NEXIUM) 10 MG packet      losartan (COZAAR) 100 MG tablet Take 1 tablet (100 mg total) by mouth daily. 90 tablet 3   metoprolol tartrate (LOPRESSOR) 25 MG tablet TAKE 1/2 TABLET TWICE A DAY 90 tablet 3   omeprazole (PRILOSEC) 40 MG capsule Take 40 mg by mouth daily.     rosuvastatin (CRESTOR) 20 MG tablet Take 20 mg by mouth daily.     COVID-19 mRNA bivalent vaccine, Pfizer, (PFIZER COVID-19 VAC BIVALENT) injection Inject into the muscle. (Patient not taking: Reported on 01/11/2023) 0.3 mL 0   influenza vaccine adjuvanted (FLUAD QUADRIVALENT) 0.5 ML injection Inject into the muscle. (Patient not taking: Reported on 01/11/2023) 0.5 mL 0   No current facility-administered medications for this visit.     Family History    Family History  Problem Relation Age of Onset   Heart attack Father    Stroke Mother    Heart attack Brother    Cancer Brother        one brother-prostate   He indicated that his mother is deceased. He indicated that his father is  deceased. He indicated that his sister is deceased. He indicated that only one of his two brothers is alive.  Social History    Social History   Socioeconomic History   Marital status: Single    Spouse name: Not on file   Number of children: Not on file   Years of education: Not on file   Highest education level: Not on file  Occupational History   Not on file  Tobacco Use   Smoking status: Former    Current packs/day: 0.00    Average packs/day: 2.0 packs/day for 25.0 years (50.0 ttl pk-yrs)    Types: Cigarettes    Start date: 04/19/1958    Quit date: 04/20/1983    Years  since quitting: 39.7   Smokeless tobacco: Never  Substance and Sexual Activity   Alcohol use: Yes    Alcohol/week: 0.0 standard drinks of alcohol    Comment: 3 a day   Drug use: No   Sexual activity: Not on file  Other Topics Concern   Not on file  Social History Narrative   Retired.    Wife - Joyce Gross.   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 No tobacco products.   Enjoys playing golf with his friends - if schedule allows.   Does Maintenance Cardiac Rehab   Social Determinants of Health   Financial Resource Strain: Low Risk  (07/20/2022)   Received from [facility]   Overall Financial Resource Strain (CARDIA)    Difficulty of Paying Living Expenses: Not hard at all  Food Insecurity: No Food Insecurity (08/05/2022)   Received from [facility], [facility]   Hunger Vital Sign    Worried About Running Out of Food in the Last Year: Never true    Ran Out of Food in the Last Year: Never true  Transportation Needs: No Transportation Needs (08/05/2022)   Received from [facility], [facility]   PRAPARE - Transportation    Lack of Transportation (Medical): No    Lack of Transportation (Non-Medical): No  Physical Activity: Unknown (01/21/2022)   Received from [facility], [facility]   Exercise Vital Sign    Days of Exercise per Week: 0 days    Minutes of Exercise per Session: Not on  file  Recent Concern: Physical Activity - Inactive (01/21/2022)   Received from [facility]   Exercise Vital Sign    Days of Exercise per Week: 0 days    Minutes of Exercise per Session: 0 min  Stress: No Stress Concern Present (01/21/2022)   Received from [facility], [facility] - Occupational Stress Questionnaire    Feeling of Stress : Not at all  Social Connections: Socially Integrated (01/21/2022)   Received from [facility], [facility]   Social Network    How would you rate your social network (family, work, friends)?: Good participation with social networks  Intimate Partner Violence: Not At Risk (01/21/2022)   Received from [facility], [facility]   HITS    Over the last 12 months how often did your partner physically hurt you?: 1    Over the last 12 months how often did your partner insult you or talk down to you?: 1    Over the last 12 months how often did your partner threaten you with physical harm?: 1    Over the last 12 months how often did your partner scream or curse at you?: 1     Review of Systems    General:  No chills, fever, night sweats or weight changes.  Cardiovascular:  No chest pain, dyspnea on exertion, edema, orthopnea, palpitations, paroxysmal nocturnal dyspnea. Dermatological: No rash, lesions/masses Respiratory: No cough, dyspnea Urologic: No hematuria, dysuria Abdominal:   No nausea, vomiting, diarrhea, bright red blood per rectum, melena, or hematemesis Neurologic:  No visual changes, wkns, changes in mental status. All other systems reviewed and are otherwise negative except as noted above.  EKG Interpretation Date/Time:  Monday January 11 2023 15:42:26 EDT Ventricular Rate:  78 PR Interval:  222 QRS Duration:  86 QT Interval:  426 QTC Calculation: 485 R Axis:   14  Text Interpretation: Sinus rhythm with 1st degree A-V block with occasional  Premature ventricular complexes Anteroseptal  infarct , age undetermined When compared with ECG of 01-May-2016 11:41, Premature ventricular complexes are now Present Anteroseptal infarct is now Present Confirmed by Joni Reining [phone]) on 01/11/2023 3:56:11 PM    Physical Exam    VS:  BP 126/72 (BP Location: Left Arm, Patient Position: Sitting, Cuff Size: Normal)   Pulse 78   Ht 6' (1.829 m)   Wt 173 lb 12.8 oz (78.8 kg)   SpO2 95%   BMI 23.57 kg/m  , BMI Body mass index is 23.57 kg/m.     GEN: Well nourished, well developed, in no acute distress. HEENT: normal. Neck: Supple, no JVD, carotid bruits, or masses. Cardiac: RRR, no murmurs, rubs, or gallops. No clubbing, cyanosis, edema.  Radials/DP/PT 2+ and equal bilaterally.  Respiratory:  Respirations regular and unlabored, clear to auscultation bilaterally. GI: Soft, nontender, nondistended, BS + x 4. MS: no deformity or atrophy.  Kyphosis is noted. Skin: warm and dry, no rash. Neuro:  Strength and sensation are intact. Psych: Normal affect.  EKG Interpretation Date/Time:  Monday January 11 2023 15:42:26 EDT Ventricular Rate:  78 PR Interval:  222 QRS Duration:  86 QT Interval:  426 QTC Calculation: 485 R Axis:   14  Text Interpretation: Sinus rhythm with 1st degree A-V block with occasional Premature ventricular complexes Anteroseptal infarct , age undetermined When compared with ECG of 01-May-2016 11:41, Premature ventricular complexes are now Present Anteroseptal infarct is now Present Confirmed by Joni Reining [phone]) on 01/11/2023 3:56:11 PM   Lab Results  Component Value Date   WBC 5.2 08/15/2016   HGB 13.4 08/15/2016   HCT 40.5 08/15/2016   MCV 91.4 08/15/2016   PLT 98 (L) 08/15/2016   Lab Results  Component Value Date   CREATININE 1.07 08/15/2016   BUN 13 08/15/2016   NA 138 08/15/2016   K 3.6 08/15/2016   CL 106 08/15/2016   CO2 22 08/15/2016   Lab Results  Component Value Date   ALT 43 08/15/2016   AST 47 (H) 08/15/2016   ALKPHOS 67  08/15/2016   BILITOT 1.2 08/15/2016   No results found for: "CHOL", "HDL", "LDLCALC", "LDLDIRECT", "TRIG", "CHOLHDL"  Lab Results  Component Value Date   HGBA1C 5.6 03/21/2014     Review of Prior Studies EKG Interpretation Date/Time:  Monday January 11 2023 15:42:26 EDT Ventricular Rate:  78 PR Interval:  222 QRS Duration:  86 QT Interval:  426 QTC Calculation: 485 R Axis:   14  Text Interpretation: Sinus rhythm with 1st degree A-V block with occasional Premature ventricular complexes Anteroseptal infarct , age undetermined When compared with ECG of 01-May-2016 11:41, Premature ventricular complexes are now Present Anteroseptal infarct is now Present Confirmed by Joni Reining [phone]) on 01/11/2023 3:56:11 PM    Assessment & Plan   1.  Coronary artery disease: History of CABG in 2016.  He will be scheduled for follow-up nuclear medicine stress test at Dr. Elissa Hefty request in August 2025 before follow-up in September 2025 with Dr. Herbie Baltimore for annual visit.  He knows to call us sooner if he becomes symptomatic.  For now secondary prevention with blood pressure control, lipid management, and low-cholesterol diet.  I have signed him back up for cardiac rehab so that he can have purposeful exercise as he has missed doing so and would like to finish cardiac rehab program again as this was stopped during pandemic.     Cardiac Rehabilitation Eligibility Assessment  The patient is ready to  start cardiac rehabilitation from a cardiac standpoint.   Informed Consent    2.  Hypercholesterolemia: Continue rosuvastatin 20 mg daily.  Labs are followed by PCP.  If not completed by next office visit these will be drawn for ongoing surveillance.  Goal of LDL less than 50.  3.  Hypertension: Excellent control of blood pressure on amlodipine 10 mg daily metoprolol 25 mg daily and losartan 100 mg daily.  No changes in his regimen at this time.  Labs are followed by PCP.  Shared Decision  Making/Informed Consent The risks [chest pain, shortness of breath, cardiac arrhythmias, dizziness, blood pressure fluctuations, myocardial infarction, stroke/transient ischemic attack, nausea, vomiting, allergic reaction, radiation exposure, metallic taste sensation and life-threatening complications (estimated to be 1 in 10,000)], benefits (risk stratification, diagnosing coronary artery disease, treatment guidance) and alternatives of a nuclear stress test were discussed in detail with Robert Boyle and he agrees to proceed.      Signed, Bettey Mare. Liborio Nixon, ANP, AACC   01/11/2023 4:43 PM      Office [phone] Fax [phone]  Notice: This dictation was prepared with Dragon dictation along with smaller phrase technology. Any transcriptional errors that result from this process are unintentional and may not be corrected upon review.

## 2023-01-11 ENCOUNTER — Encounter: Payer: Self-pay | Admitting: Adult Health

## 2023-01-11 ENCOUNTER — Ambulatory Visit: Payer: Medicare HMO | Attending: Adult Health | Admitting: Adult Health

## 2023-01-11 VITALS — BP 126/72 | HR 78 | Ht 72.0 in | Wt 173.8 lb

## 2023-01-11 DIAGNOSIS — Z951 Presence of aortocoronary bypass graft: Secondary | ICD-10-CM | POA: Diagnosis not present

## 2023-01-11 DIAGNOSIS — E7800 Pure hypercholesterolemia, unspecified: Secondary | ICD-10-CM

## 2023-01-11 DIAGNOSIS — I10 Essential (primary) hypertension: Secondary | ICD-10-CM

## 2023-01-11 NOTE — Patient Instructions (Signed)
Medication Instructions:  No Changes *If you need a refill on your cardiac medications before your next appointment, please call your pharmacy*   Lab Work: No Labs If you have labs (blood work) drawn today and your tests are completely normal, you will receive your results only by: MyChart Message (if you have MyChart) OR A paper copy in the mail If you have any lab test that is abnormal or we need to change your treatment, we will call you to review the results.   Testing/Procedures: [address], [address] ( August 2025). Your physician has requested that you have en exercise stress myoview. For further information please visit [email or web address] Please follow instruction sheet, as given.    Follow-Up: At [facility], you and your health needs are our priority.  As part of our continuing mission to provide you with exceptional heart care, we have created designated Provider Care Teams.  These Care Teams include your primary Cardiologist (physician) and Advanced Practice Providers (APPs -  Physician Assistants and Nurse Practitioners) who all work together to provide you with the care you need, when you need it.  We recommend signing up for the patient portal called "MyChart".  Sign up information is provided on this After Visit Summary.  MyChart is used to connect with patients for Virtual Visits (Telemedicine).  Patients are able to view lab/test results, encounter notes, upcoming appointments, etc.  Non-urgent messages can be sent to your provider as well.   To learn more about what you can do with MyChart, go to [email or web address].    Your next appointment:   1 year(s)  Provider:   Bryan Lemma, MD

## 2023-06-16 ENCOUNTER — Other Ambulatory Visit: Payer: Self-pay | Admitting: Cardiology

## 2023-06-25 ENCOUNTER — Telehealth: Payer: Self-pay | Admitting: Cardiology

## 2023-06-25 MED ORDER — ROSUVASTATIN CALCIUM 20 MG PO TABS: 20.0000 mg | ORAL_TABLET | Freq: Every day | ORAL | 1 refills | Status: AC

## 2023-06-25 NOTE — Telephone Encounter (Signed)
*  STAT* If patient is at the pharmacy, call can be transferred to refill team.   1. Which medications need to be refilled? (please list name of each medication and dose if known)   rosuvastatin (CRESTOR) 20 MG tablet  Take 20 mg by mouth daily.  2. Would you like to learn more about the convenience, safety, & potential cost savings by using the [facility]?No   3. Are you open to using the [facility] No   4. Which pharmacy/location (including street and city if local pharmacy) is medication to be sent to? [facility] - [place], [place] - [address]    5. Do they need a 30 day or 90 day supply? 90 Day Supply  Pt is currently out of medication

## 2023-06-25 NOTE — Telephone Encounter (Signed)
 Pt's medication was sent to pt's pharmacy as requested. Confirmation received.

## 2023-07-15 ENCOUNTER — Telehealth: Payer: Self-pay | Admitting: *Deleted

## 2023-07-15 NOTE — Telephone Encounter (Signed)
 Copied from CRM [id number]. Topic: Appointments - Scheduling Inquiry for Clinic >> Jul 15, 2023  9:48 AM Prudencio Pair wrote: Reason for CRM: Patient's spouse, Joyce Gross, called in stating that her neighbor is a retired cardiologist & advised her to schedule her husband to be seen by Dr. Mayford Knife. Advised her that Dr. Mayford Knife is not accepting new patients but she stated to send a message to see if she will make an exception since they were referred by Dr. Caprice Kluver.

## 2023-07-15 NOTE — Telephone Encounter (Signed)
 Please see Previous Message.

## 2023-07-23 ENCOUNTER — Other Ambulatory Visit: Payer: Self-pay | Admitting: Cardiology

## 2023-09-30 NOTE — Telephone Encounter (Signed)
 At this time we are not accepting any new patients. Patient has an assigned PCP in the banner. Dr. Broadus Canes is not taking any new patients at this time.

## 2023-11-26 ENCOUNTER — Encounter (HOSPITAL_COMMUNITY): Payer: Self-pay | Admitting: *Deleted

## 2023-12-02 ENCOUNTER — Telehealth (HOSPITAL_COMMUNITY): Payer: Self-pay | Admitting: Adult Health

## 2023-12-02 ENCOUNTER — Other Ambulatory Visit: Payer: Self-pay | Admitting: Adult Health

## 2023-12-02 DIAGNOSIS — I10 Essential (primary) hypertension: Secondary | ICD-10-CM

## 2023-12-02 DIAGNOSIS — Z951 Presence of aortocoronary bypass graft: Secondary | ICD-10-CM

## 2023-12-02 NOTE — Telephone Encounter (Signed)
 Myoview was cancelled for reason below:  12/02/2023 10:57 AM HORNOWSKI LUZ MULE Cancel [id number] (test was ordered in Sept 2024. Per his wife: patient has dementia and is not able to complete the test.)   Order will be removed from the [facility]. Thank you.

## 2023-12-03 NOTE — Telephone Encounter (Signed)
Perfectly fine

## 2023-12-06 ENCOUNTER — Ambulatory Visit (HOSPITAL_COMMUNITY): Admission: RE | Admit: 2023-12-06 | Payer: Medicare HMO | Source: Ambulatory Visit

## 2024-04-07 ENCOUNTER — Other Ambulatory Visit: Payer: Self-pay | Admitting: Cardiology

## 2024-04-30 ENCOUNTER — Other Ambulatory Visit: Payer: Self-pay | Admitting: Cardiology

## 2024-05-20 ENCOUNTER — Other Ambulatory Visit: Payer: Self-pay | Admitting: Cardiology

## 2024-05-24 NOTE — Telephone Encounter (Signed)
 Abs Normal on 04/26/24 Care Everywhere
